# Patient Record
Sex: Female | Born: 1990 | Race: Black or African American | Hispanic: No | Marital: Single | Smoking: Former smoker
Health system: Southern US, Community
[De-identification: ages and names within clinical notes are randomized; demographics above are authoritative.]

## PROBLEM LIST (undated history)

## (undated) ENCOUNTER — Inpatient Hospital Stay (HOSPITAL_COMMUNITY): Payer: Self-pay

## (undated) DIAGNOSIS — O4290 Premature rupture of membranes, unspecified as to length of time between rupture and onset of labor, unspecified weeks of gestation: Secondary | ICD-10-CM

## (undated) DIAGNOSIS — B9689 Other specified bacterial agents as the cause of diseases classified elsewhere: Secondary | ICD-10-CM

## (undated) DIAGNOSIS — G43909 Migraine, unspecified, not intractable, without status migrainosus: Secondary | ICD-10-CM

## (undated) DIAGNOSIS — N760 Acute vaginitis: Secondary | ICD-10-CM

## (undated) DIAGNOSIS — N730 Acute parametritis and pelvic cellulitis: Secondary | ICD-10-CM

## (undated) HISTORY — DX: Migraine, unspecified, not intractable, without status migrainosus: G43.909

## (undated) HISTORY — PX: NO PAST SURGERIES: SHX2092

---
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
## 2011-01-10 LAB — POCT I-STAT, CHEM 8
Calcium, Ion: 1.13 mmol/L (ref 1.12–1.32)
Chloride: 102 mEq/L (ref 96–112)
HCT: 40 % (ref 36.0–49.0)
Hemoglobin: 13.6 g/dL (ref 12.0–16.0)
Potassium: 3.5 mEq/L (ref 3.5–5.1)

## 2011-01-10 LAB — POCT URINALYSIS DIP (DEVICE)
Bilirubin Urine: NEGATIVE
Glucose, UA: NEGATIVE mg/dL
Nitrite: POSITIVE — AB
Urobilinogen, UA: 1 mg/dL (ref 0.0–1.0)

## 2011-01-10 LAB — URINE CULTURE: Colony Count: >=100000

## 2011-01-10 LAB — CBC
MCHC: 34.7 g/dL (ref 31.0–37.0)
Platelets: 289 10*3/uL (ref 150–400)
RDW: 12.5 % (ref 11.4–15.5)

## 2011-01-10 LAB — DIFFERENTIAL
Basophils Absolute: 0 10*3/uL (ref 0.0–0.1)
Eosinophils Absolute: 0 10*3/uL (ref 0.0–1.2)
Lymphocytes Relative: 5 % — ABNORMAL LOW (ref 24–48)
Neutrophils Relative %: 85 % — ABNORMAL HIGH (ref 43–71)

## 2011-04-09 ENCOUNTER — Inpatient Hospital Stay (HOSPITAL_COMMUNITY)
Admission: EM | Admit: 2011-04-09 | Discharge: 2011-04-09 | Disposition: A | Discharge status: Home / Self Care | Payer: Medicaid Other | Source: Ambulatory Visit | Attending: Obstetrics & Gynecology | Admitting: Obstetrics & Gynecology

## 2011-04-09 ENCOUNTER — Encounter (HOSPITAL_COMMUNITY): Payer: Self-pay | Admitting: *Deleted

## 2011-04-09 DIAGNOSIS — N898 Other specified noninflammatory disorders of vagina: Secondary | ICD-10-CM

## 2011-04-09 DIAGNOSIS — N949 Unspecified condition associated with female genital organs and menstrual cycle: Secondary | ICD-10-CM | POA: Insufficient documentation

## 2011-04-09 LAB — POCT PREGNANCY, URINE: Preg Test, Ur: NEGATIVE

## 2011-04-09 LAB — WET PREP, GENITAL
Clue Cells Wet Prep HPF POC: NONE SEEN
Trich, Wet Prep: NONE SEEN
Yeast Wet Prep HPF POC: NONE SEEN

## 2011-04-09 LAB — URINALYSIS, ROUTINE W REFLEX MICROSCOPIC
Glucose, UA: NEGATIVE mg/dL
Hgb urine dipstick: NEGATIVE
Leukocytes, UA: NEGATIVE
Protein, ur: NEGATIVE mg/dL
pH: 7 (ref 5.0–8.0)

## 2011-04-09 NOTE — Initial Assessments (Signed)
abd (mid/bilateral), mid-back pain  started a wk ago. Intermittent, sharp sudden pains.  Had sex last wk, couple days later, noted a brownish d/c in  [place] - noted x1.  Denies pain or burning with urination.  Denies n/v, diarrhea or constipation.  Not a new partner, off and on since 6th grade.  2 partners in past year.

## 2011-04-09 NOTE — ED Provider Notes (Signed)
History   Pt presents today c/o vag dc for the past week. She is concerned that she could be pregnant. She denies vag bleeding, fever, or current pain. She states the pain comes and goes and is located in her lower abd.  Chief Complaint  Patient presents with  . Back Pain   HPI  OB History    Grav Para Term Preterm Abortions TAB SAB Ect Mult Living   0               Past Medical History  Diagnosis Date  . No pertinent past medical history     Past Surgical History  Procedure Date  . No past surgeries     No family history on file.  History  Substance Use Topics  . Smoking status: Current Some Day Smoker -- 3 years    Types: Cigarettes  . Smokeless tobacco: Never Used  . Alcohol Use: No    Allergies: No Known Allergies  Prescriptions prior to admission  Medication Sig Dispense Refill  . levonorgestrel-ethinyl estradiol (AVIANE,ALESSE,LESSINA) 0.1-20 MG-MCG per tablet Take 1 tablet by mouth daily.          Review of Systems  Constitutional: Negative for fever and chills.  Cardiovascular: Negative for chest pain.  Gastrointestinal: Positive for abdominal pain. Negative for nausea and vomiting.  Genitourinary: Negative for dysuria, urgency, frequency and hematuria.  Neurological: Negative for dizziness and headaches.  Psychiatric/Behavioral: Negative for depression and suicidal ideas.   Physical Exam   Blood pressure 111/74, pulse 61, temperature 98.1 F (36.7 C), temperature source Oral, resp. rate 16, height 5\' 3"  (1.6 m), weight 102 lb 12.8 oz (46.63 kg), last menstrual period 03/20/2011.  Physical Exam  Constitutional: She is oriented to person, place, and time. She appears well-developed and well-nourished. No distress.  GI: Soft. She exhibits no distension. There is no tenderness. There is no rebound and no guarding.  Genitourinary: No bleeding around the vagina. Vaginal discharge found.  Neurological: She is alert and oriented to person, place, and time.    Skin: Skin is warm and dry.  Psychiatric: She has a normal mood and affect. Her behavior is normal. Judgment and thought content normal.    MAU Course  Procedures   Wet prep shows WBCs TNTC. UA neg  A/P: 1) normal vag dc. Pt will f/u with her PCP. Discussed safe sex precautions and methods of birth control. She will f/u with her PCP to further discuss birth control options. She will take OTC motrin for pain relief. Discussed diet, activity, risks, and precautions.  Candice White, [place] 04/09/11 1528

## 2011-04-09 NOTE — Progress Notes (Signed)
Pt states she has had a vaginal d/c after having sex and her friend told her she might be pregnant. Pt states she has also had general abdominal and general back pain for about one week.

## 2011-10-11 ENCOUNTER — Emergency Department (HOSPITAL_COMMUNITY)
Admission: EM | Admit: 2011-10-11 | Discharge: 2011-10-11 | Disposition: A | Discharge status: Home / Self Care | Payer: Medicaid Other | Attending: Emergency Medicine | Admitting: Emergency Medicine

## 2011-10-11 ENCOUNTER — Encounter (HOSPITAL_COMMUNITY): Payer: Self-pay | Admitting: *Deleted

## 2011-10-11 DIAGNOSIS — N760 Acute vaginitis: Secondary | ICD-10-CM | POA: Insufficient documentation

## 2011-10-11 DIAGNOSIS — B9689 Other specified bacterial agents as the cause of diseases classified elsewhere: Secondary | ICD-10-CM | POA: Insufficient documentation

## 2011-10-11 DIAGNOSIS — R109 Unspecified abdominal pain: Secondary | ICD-10-CM | POA: Insufficient documentation

## 2011-10-11 DIAGNOSIS — A499 Bacterial infection, unspecified: Secondary | ICD-10-CM | POA: Insufficient documentation

## 2011-10-11 DIAGNOSIS — N644 Mastodynia: Secondary | ICD-10-CM | POA: Insufficient documentation

## 2011-10-11 DIAGNOSIS — N739 Female pelvic inflammatory disease, unspecified: Secondary | ICD-10-CM | POA: Insufficient documentation

## 2011-10-11 DIAGNOSIS — R300 Dysuria: Secondary | ICD-10-CM | POA: Insufficient documentation

## 2011-10-11 DIAGNOSIS — F17200 Nicotine dependence, unspecified, uncomplicated: Secondary | ICD-10-CM | POA: Insufficient documentation

## 2011-10-11 DIAGNOSIS — R10819 Abdominal tenderness, unspecified site: Secondary | ICD-10-CM | POA: Insufficient documentation

## 2011-10-11 DIAGNOSIS — N949 Unspecified condition associated with female genital organs and menstrual cycle: Secondary | ICD-10-CM | POA: Insufficient documentation

## 2011-10-11 LAB — WET PREP, GENITAL
Trich, Wet Prep: NONE SEEN
Yeast Wet Prep HPF POC: NONE SEEN

## 2011-10-11 LAB — URINALYSIS, ROUTINE W REFLEX MICROSCOPIC
Bilirubin Urine: NEGATIVE
Glucose, UA: NEGATIVE mg/dL
Hgb urine dipstick: NEGATIVE
Ketones, ur: NEGATIVE mg/dL
Protein, ur: 100 mg/dL — AB
pH: 6.5 (ref 5.0–8.0)

## 2011-10-11 LAB — URINE MICROSCOPIC-ADD ON

## 2011-10-11 MED ORDER — CEFTRIAXONE SODIUM 250 MG IJ SOLR
250.0000 mg | Freq: Once | INTRAMUSCULAR | Status: AC
  Administered 2011-10-11: 250 mg via INTRAMUSCULAR
  Filled 2011-10-11: qty 250

## 2011-10-11 MED ORDER — DOXYCYCLINE HYCLATE 100 MG PO TABS
100.0000 mg | ORAL_TABLET | Freq: Once | ORAL | Status: AC
  Administered 2011-10-11: 100 mg via ORAL
  Filled 2011-10-11: qty 1

## 2011-10-11 MED ORDER — LIDOCAINE HCL (PF) 1 % IJ SOLN
INTRAMUSCULAR | Status: AC
  Administered 2011-10-11: 21:00:00
  Filled 2011-10-11: qty 5

## 2011-10-11 MED ORDER — DOXYCYCLINE HYCLATE 100 MG PO TABS: 100.0000 mg | ORAL_TABLET | Freq: Two times a day (BID) | ORAL | Status: AC

## 2011-10-11 MED ORDER — METRONIDAZOLE 500 MG PO TABS: 500.0000 mg | ORAL_TABLET | Freq: Two times a day (BID) | ORAL | Status: AC

## 2011-10-11 NOTE — ED Notes (Signed)
Onset 3 days ago intermittent pain bilateral breast. Pain currently 0/10 when pain appears pain is sharp 10/10 lasting for 1 - 2 minutes.  Patient also states dysuria burning 10/10 and at pelvic pain pressure 5/10 radiating to bilateral flanks. Airway intact bilateral equal chest rise and fall.

## 2011-10-11 NOTE — ED Notes (Signed)
Discharge inst given voiced understanding. 

## 2011-10-11 NOTE — ED Notes (Signed)
The pt is c/o lower back and lower abd pain for 3 days. With urinary frequency.  lmp dec 2-12

## 2011-10-11 NOTE — ED Provider Notes (Signed)
History     CSN: [id number]  Arrival date & time 10/11/11  1610   First MD Initiated Contact with Patient 10/11/11 1850      Chief Complaint  Patient presents with  . Abdominal Pain    (Consider location/radiation/quality/duration/timing/severity/associated sxs/prior treatment) HPI  Past Medical History  Diagnosis Date  . No pertinent past medical history     Past Surgical History  Procedure Date  . No past surgeries     History reviewed. No pertinent family history.  History  Substance Use Topics  . Smoking status: Current Some Day Smoker -- 3 years    Types: Cigarettes  . Smokeless tobacco: Never Used  . Alcohol Use: No    OB History    Grav Para Term Preterm Abortions TAB SAB Ect Mult Living   0               Review of Systems  Allergies  Review of patient's allergies indicates no known allergies.  Home Medications   Current Outpatient Rx  Name Route Sig Dispense Refill  . DOXYCYCLINE HYCLATE 100 MG PO TABS Oral Take 1 tablet (100 mg total) by mouth 2 (two) times daily. 28 tablet 0    Take twice a day for 14 days.  Marland Kitchen METRONIDAZOLE 500 MG PO TABS Oral Take 1 tablet (500 mg total) by mouth 2 (two) times daily. 14 tablet 0    BP 100/61  Pulse 77  Temp(Src) 98.3 F (36.8 C) (Oral)  Resp 16  Ht 5\' 3"  (1.6 m)  SpO2 100%  LMP 09/10/2011  Physical Exam  ED Course  Procedures (including critical care time)  Labs Reviewed  URINALYSIS, ROUTINE W REFLEX MICROSCOPIC - Abnormal; Notable for the following:    APPearance HAZY (*)    Protein, ur 100 (*)    All other components within normal limits  URINE MICROSCOPIC-ADD ON - Abnormal; Notable for the following:    Squamous Epithelial / LPF MANY (*)    All other components within normal limits  WET PREP, GENITAL - Abnormal; Notable for the following:    Clue Cells, Wet Prep FEW (*)    All other components within normal limits  POCT PREGNANCY, URINE  POCT PREGNANCY, URINE  GC/CHLAMYDIA PROBE AMP,  GENITAL   No results found.   1. Pelvic inflammatory disease   2. Bacterial vaginosis       MDM  Medical screening examination/treatment/procedure(s) were performed by non-physician practitioner and as supervising physician I was immediately available for consultation/collaboration.        Loren Racer, MD 10/11/11 2208

## 2011-10-11 NOTE — ED Provider Notes (Signed)
History     CSN: [id number]  Arrival date & time 10/11/11  1610   First MD Initiated Contact with Patient 10/11/11 1850      Chief Complaint  Patient presents with  . Abdominal Pain    (Consider location/radiation/quality/duration/timing/severity/associated sxs/prior treatment) Patient is a 21 y.o. female presenting with abdominal pain. The history is provided by the patient. No language interpreter was used.  Abdominal Pain The primary symptoms of the illness include abdominal pain, dysuria and vaginal discharge (mild, white). The primary symptoms of the illness do not include fever, fatigue, shortness of breath, nausea, vomiting, diarrhea or vaginal bleeding. The current episode started more than 2 days ago. The onset of the illness was sudden. The problem has not changed since onset. The abdominal pain began more than 2 days ago. The pain came on gradually. The abdominal pain has been unchanged since its onset. The abdominal pain is located in the suprapubic region. The abdominal pain radiates to the back. The abdominal pain is relieved by nothing.  The dysuria began 3 to 5 days ago. The discomfort is mild. She is currently sexually active. The dysuria is associated with discharge (mild vaginal discharge). The dysuria is not associated with hematuria, frequency, urgency or vaginal pain.  The vaginal discharge is associated with dysuria.   Symptoms associated with the illness do not include urgency, hematuria or frequency.    Past Medical History  Diagnosis Date  . No pertinent past medical history     Past Surgical History  Procedure Date  . No past surgeries     History reviewed. No pertinent family history.  History  Substance Use Topics  . Smoking status: Current Some Day Smoker -- 3 years    Types: Cigarettes  . Smokeless tobacco: Never Used  . Alcohol Use: No    OB History    Grav Para Term Preterm Abortions TAB SAB Ect Mult Living   0               Review  of Systems  Constitutional: Negative for fever and fatigue.  Respiratory: Negative for shortness of breath.   Cardiovascular:       Sharp, intermittent, brief breast pains  Gastrointestinal: Positive for abdominal pain. Negative for nausea, vomiting and diarrhea.  Genitourinary: Positive for dysuria and vaginal discharge (mild, white). Negative for urgency, frequency, hematuria, vaginal bleeding and vaginal pain.  All other systems reviewed and are negative.    Allergies  Review of patient's allergies indicates no known allergies.  Home Medications  No current outpatient prescriptions on file.  BP 100/61  Pulse 77  Temp(Src) 98.3 F (36.8 C) (Oral)  Resp 16  Ht 5\' 3"  (1.6 m)  SpO2 100%  LMP 09/10/2011  Physical Exam  Nursing note and vitals reviewed. Constitutional: She is oriented to person, place, and time. She appears well-developed and well-nourished. No distress.  HENT:  Head: Normocephalic and atraumatic.  Eyes: EOM are normal. Pupils are equal, round, and reactive to light.  Neck: Normal range of motion. Neck supple.  Cardiovascular: Normal rate and regular rhythm.  Exam reveals no friction rub.   No murmur heard. Pulmonary/Chest: Effort normal and breath sounds normal. No respiratory distress. She has no wheezes. She has no rales.  Abdominal: Soft. She exhibits no distension. There is tenderness (Mild suprapubic). There is no rebound.  Genitourinary: There is no rash, tenderness, lesion or injury on the right labia. There is no rash, tenderness, lesion or injury on the left labia.  Cervix exhibits no motion tenderness, no discharge and no friability. Right adnexum displays tenderness (mild). Left adnexum displays tenderness (mild). There is tenderness (Mild in bilateral adnexa') around the vagina. Vaginal discharge (Mild, white) found.  Musculoskeletal: Normal range of motion. She exhibits no edema.  Neurological: She is alert and oriented to person, place, and time.    Skin: She is not diaphoretic.    ED Course  Procedures (including critical care time)  Labs Reviewed  URINALYSIS, ROUTINE W REFLEX MICROSCOPIC - Abnormal; Notable for the following:    APPearance HAZY (*)    Protein, ur 100 (*)    All other components within normal limits  URINE MICROSCOPIC-ADD ON - Abnormal; Notable for the following:    Squamous Epithelial / LPF MANY (*)    All other components within normal limits  WET PREP, GENITAL - Abnormal; Notable for the following:    Clue Cells, Wet Prep FEW (*)    All other components within normal limits  POCT PREGNANCY, URINE  POCT PREGNANCY, URINE  GC/CHLAMYDIA PROBE AMP, GENITAL   No results found.   1. Pelvic inflammatory disease   2. Bacterial vaginosis       MDM  Patient is a 21 year old female who presents with lower abdominal pain radiating to her lower back. Also reports mild dysuria. Also has mild vaginal discharge. Denies fevers, nausea, vomiting. Recent unprotected sex with a new partner is concerned about possible exposure to STDs. Patient is afebrile vital signs are stable. Patient is mild suprapubic tenderness otherwise abdomen is benign. Patient has no genitourinary lesions. Cervix is normal without friability or redness. Patient has mild tenderness in bilateral adnexa without mass or fullness. No cervical motion tenderness. Urine studies are negative for UTI. Concern for pelvic inflammatory disease with bilateral adnexal tenderness. Will treat with intramuscular Rocephin and PO doxycycline. Patient has clue cells on wet prep. We'll give Flagyl for bacterial vaginosis. Discharge home in stable condition.        Elwin Mocha, MD 10/11/11 2051

## 2011-10-12 NOTE — ED Provider Notes (Signed)
I saw and evaluated the patient, reviewed the resident's note and I agree with the findings and plan.  Loren Racer, MD 10/12/11 [phone]

## 2012-01-15 ENCOUNTER — Emergency Department (INDEPENDENT_AMBULATORY_CARE_PROVIDER_SITE_OTHER)
Admission: EM | Admit: 2012-01-15 | Discharge: 2012-01-15 | Disposition: A | Discharge status: Home / Self Care | Payer: Medicaid Other | Source: Home / Self Care | Attending: Emergency Medicine | Admitting: Emergency Medicine

## 2012-01-15 ENCOUNTER — Encounter (HOSPITAL_COMMUNITY): Payer: Self-pay | Admitting: Emergency Medicine

## 2012-01-15 DIAGNOSIS — Z3202 Encounter for pregnancy test, result negative: Secondary | ICD-10-CM

## 2012-01-15 DIAGNOSIS — Z0000 Encounter for general adult medical examination without abnormal findings: Secondary | ICD-10-CM

## 2012-01-15 HISTORY — DX: Acute vaginitis: N76.0

## 2012-01-15 HISTORY — DX: Acute parametritis and pelvic cellulitis: N73.0

## 2012-01-15 HISTORY — DX: Other specified bacterial agents as the cause of diseases classified elsewhere: B96.89

## 2012-01-15 LAB — POCT URINALYSIS DIP (DEVICE)
Glucose, UA: NEGATIVE mg/dL
Ketones, ur: NEGATIVE mg/dL
Leukocytes, UA: NEGATIVE
Nitrite: NEGATIVE
pH: 6 (ref 5.0–8.0)

## 2012-01-15 LAB — POCT PREGNANCY, URINE: Preg Test, Ur: NEGATIVE

## 2012-01-15 NOTE — ED Provider Notes (Addendum)
History     CSN: [id number]  Arrival date & time 01/15/12  1331   First MD Initiated Contact with Patient 01/15/12 1449      Chief Complaint  Patient presents with  . Possible Pregnancy    (Consider location/radiation/quality/duration/timing/severity/associated sxs/prior treatment) HPI Comments: Patient is here for pregnancy test. States she's not had her period since "sometime in February". States that her nipples are tender. Patient has no urinary urgency, frequency, dysuria, vaginal bleeding, vaginal discharge. No genital complaints. No abdominal pain. Patient states she is sexually active with a female partner, and does not consistently use condoms. Patient is not check a home pregnancy test.  ROS as noted in HPI. All other ROS negative.   The history is provided by the patient. No language interpreter was used.    Past Medical History  Diagnosis Date  . PID (acute pelvic inflammatory disease)   . BV (bacterial vaginosis)     Past Surgical History  Procedure Date  . No past surgeries     History reviewed. No pertinent family history.  History  Substance Use Topics  . Smoking status: Current Some Day Smoker -- 3 years    Types: Cigarettes  . Smokeless tobacco: Never Used  . Alcohol Use: No    OB History    Grav Para Term Preterm Abortions TAB SAB Ect Mult Living   0               Review of Systems  Allergies  Review of patient's allergies indicates no known allergies.  Home Medications  No current outpatient prescriptions on file.  BP 80/62  Pulse 66  Temp(Src) 97.2 F (36.2 C) (Oral)  Resp 14  SpO2 100%  LMP 11/20/2011  Physical Exam  Nursing note and vitals reviewed. Constitutional: She is oriented to person, place, and time. She appears well-developed and well-nourished. No distress.  HENT:  Head: Normocephalic and atraumatic.  Eyes: Conjunctivae and EOM are normal.  Neck: Normal range of motion.  Cardiovascular: Normal rate, regular  rhythm, normal heart sounds and intact distal pulses.   Pulmonary/Chest: Effort normal.  Abdominal: She exhibits no distension.  Musculoskeletal: Normal range of motion.  Neurological: She is alert and oriented to person, place, and time.  Skin: Skin is warm and dry.  Psychiatric: She has a normal mood and affect. Her behavior is normal. Judgment and thought content normal.    ED Course  Procedures (including critical care time)  Labs Reviewed - No data to display No results found.   1. Normal physical exam    Urine pregnancy negative. Results not crossing over Results for orders placed during the hospital encounter of 01/15/12  POCT PREGNANCY, URINE      Component Value Range   Preg Test, Ur NEGATIVE  NEGATIVE   POCT URINALYSIS DIP (DEVICE)      Component Value Range   Glucose, UA NEGATIVE  NEGATIVE (mg/dL)   Bilirubin Urine SMALL (*) NEGATIVE    Ketones, ur NEGATIVE  NEGATIVE (mg/dL)   Specific Gravity, Urine 1.025  1.005 - 1.030    Hgb urine dipstick NEGATIVE  NEGATIVE    pH 6.0  5.0 - 8.0    Protein, ur 100 (*) NEGATIVE (mg/dL)   Urobilinogen, UA 0.2  0.0 - 1.0 (mg/dL)   Nitrite NEGATIVE  NEGATIVE    Leukocytes, UA NEGATIVE  NEGATIVE      MDM  Previous labs, reviewed. Additional medical history obtained. Tested negative for gonorrhea and chlamydia 90 of time  since January 2012, RPR negative in January 2012. No history of yeast or Trichomonas.  BP on prev visits 111/74, 100/61.  Repeat blood pressure on my exam 90/60. Patient is otherwise asymptomatic. Denies chest pain, dizziness, presyncope, syncope. No abdominal pain. Emphasized importance of routine health screenings, using birth control and condoms consistently. Referring her to a primary care resources.  Luiz Blare, MD 01/15/12 4098  Luiz Blare, MD 01/15/12 2216

## 2012-01-15 NOTE — ED Notes (Signed)
Unsure of when she last had a menstrual period; sexually active w sporadic use of condoms; c/o her "junk" is  swelling and tender, and her breasts are tender

## 2012-01-15 NOTE — ED Notes (Signed)
Lengthy discussion w pt regarding pregnancy planning

## 2012-01-15 NOTE — Discharge Instructions (Signed)
Go to www.goodrx.com to look up your medications. This will give you a list of where you can find your prescriptions at the most affordable prices.   RESOURCE GUIDE  No Primary Care Doctor Call [facility]  [phone] Other agencies that provide inexpensive medical care    [facility]  [phone]     Internal Medicine  [phone]    [facility]  [phone]    [facility]  [phone] [place] [address]    [facility]  [phone]    [facility]  [phone] [facility] [phone]   [address] [place], [place] [address]  [place] [facility]  [facility] [facility]     [employer]                          [facility] [address] [place]                       [address]      [address]   [phone] (After Hours)   

## 2012-02-19 ENCOUNTER — Ambulatory Visit: Payer: Medicaid Other | Admitting: Family Medicine

## 2012-02-20 ENCOUNTER — Emergency Department (INDEPENDENT_AMBULATORY_CARE_PROVIDER_SITE_OTHER)
Admission: EM | Admit: 2012-02-20 | Discharge: 2012-02-20 | Disposition: A | Discharge status: Home / Self Care | Payer: Medicaid Other | Source: Home / Self Care | Attending: Emergency Medicine | Admitting: Emergency Medicine

## 2012-02-20 ENCOUNTER — Encounter (HOSPITAL_COMMUNITY): Payer: Self-pay | Admitting: Emergency Medicine

## 2012-02-20 DIAGNOSIS — N898 Other specified noninflammatory disorders of vagina: Secondary | ICD-10-CM

## 2012-02-20 LAB — POCT URINALYSIS DIP (DEVICE)
Glucose, UA: NEGATIVE mg/dL
Ketones, ur: NEGATIVE mg/dL
Specific Gravity, Urine: 1.025 (ref 1.005–1.030)

## 2012-02-20 LAB — POCT PREGNANCY, URINE: Preg Test, Ur: NEGATIVE

## 2012-02-20 LAB — WET PREP, GENITAL
Trich, Wet Prep: NONE SEEN
Yeast Wet Prep HPF POC: NONE SEEN

## 2012-02-20 MED ORDER — FLUCONAZOLE 150 MG PO TABS: ORAL_TABLET | ORAL | Status: DC

## 2012-02-20 NOTE — ED Notes (Signed)
PT HERE WITH C/O YELLOW,THICK VAG D/C WITH ODOR AND YELLOW/BROWN URINE THAT STARTED 5/1.[facility] AND TOLD NEG FOR STD.NO MEDS GIVEN.DENIES ABD OR BACK PAIN,FEVERS.LMP 12/30/11

## 2012-02-20 NOTE — Discharge Instructions (Signed)
We will contact ONLY if further treatment is necessary.

## 2012-02-20 NOTE — ED Provider Notes (Signed)
History     CSN: [id number]  Arrival date & time 02/20/12  1341   First MD Initiated Contact with Patient 02/20/12 1342      Chief Complaint  Patient presents with  . Vaginal Discharge    (Consider location/radiation/quality/duration/timing/severity/associated sxs/prior treatment) HPI Comments: I have been having a vaginal discharge with some itchiness from the beginning of May, at that point I went to the [facility] and they examined me but he told me all my test results were normal. I have not been treated or given any prescriptions at discharge continues and its worse or now. With more itching. Patient denies any pelvic pain, fevers, or urinary symptoms.  Patient is a 21 y.o. female presenting with vaginal discharge. The history is provided by the patient.  Vaginal Discharge This is a new problem. The current episode started more than 1 week ago. The problem occurs constantly. The problem has not changed since onset.Pertinent negatives include no abdominal pain and no shortness of breath. The symptoms are aggravated by nothing. She has tried nothing for the symptoms.    Past Medical History  Diagnosis Date  . PID (acute pelvic inflammatory disease)   . BV (bacterial vaginosis)     Past Surgical History  Procedure Date  . No past surgeries     History reviewed. No pertinent family history.  History  Substance Use Topics  . Smoking status: Current Some Day Smoker -- 3 years    Types: Cigarettes  . Smokeless tobacco: Never Used  . Alcohol Use: Yes    OB History    Grav Para Term Preterm Abortions TAB SAB Ect Mult Living   0               Review of Systems  Constitutional: Negative for fever, diaphoresis, activity change, appetite change and fatigue.  Respiratory: Negative for shortness of breath.   Gastrointestinal: Negative for abdominal pain.  Genitourinary: Positive for vaginal discharge. Negative for dysuria, frequency, flank pain, vaginal  bleeding, difficulty urinating, genital sores and vaginal pain.    Allergies  Review of patient's allergies indicates no known allergies.  Home Medications   Current Outpatient Rx  Name Route Sig Dispense Refill  . FLUCONAZOLE 150 MG PO TABS  1 TAB PO X 1 REPEAT IN 1 WEEK 2 tablet 0    LMP 01/02/2012  Physical Exam  Constitutional: She appears well-developed and well-nourished.  HENT:  Head: Normocephalic.  Neck: Neck supple.  Pulmonary/Chest: Effort normal.  Abdominal: There is no guarding.  Genitourinary: No labial fusion. There is no rash, tenderness or lesion on the right labia. There is no rash, tenderness or lesion on the left labia. No bleeding around the vagina. No foreign body around the vagina. Vaginal discharge found.  Lymphadenopathy:       Right: No inguinal adenopathy present.       Left: No inguinal adenopathy present.  Skin: No rash noted.    ED Course  Procedures (including critical care time)  Labs Reviewed  POCT URINALYSIS DIP (DEVICE) - Abnormal; Notable for the following:    Bilirubin Urine SMALL (*)    Protein, ur 100 (*)    Leukocytes, UA SMALL (*) Biochemical Testing Only. Please order routine urinalysis from main lab if confirmatory testing is needed.   All other components within normal limits  POCT PREGNANCY, URINE  URINALYSIS, DIPSTICK ONLY  WET PREP, GENITAL  GC/CHLAMYDIA PROBE AMP, GENITAL   No results found.   1. Vaginal Discharge  MDM  Recurrent vaginitis. Today patient was STD screen and wet prep sample obtained clinically discussed with her empirical treatment for yeast infection patient agreed and was informed of OB call if abnormal test will need further treatment patient agree with treatment plan of        Jimmie Molly, MD 02/20/12 1426

## 2012-02-22 LAB — GC/CHLAMYDIA PROBE AMP, GENITAL: Chlamydia, DNA Probe: NEGATIVE

## 2012-03-11 ENCOUNTER — Encounter: Payer: Self-pay | Admitting: Family Medicine

## 2012-03-11 ENCOUNTER — Ambulatory Visit (INDEPENDENT_AMBULATORY_CARE_PROVIDER_SITE_OTHER): Payer: Self-pay | Admitting: Family Medicine

## 2012-03-11 VITALS — BP 95/63 | HR 71 | Temp 98.3°F | Ht 63.75 in | Wt 100.0 lb

## 2012-03-11 DIAGNOSIS — Z3009 Encounter for other general counseling and advice on contraception: Secondary | ICD-10-CM

## 2012-03-11 LAB — POCT URINE PREGNANCY: Preg Test, Ur: NEGATIVE

## 2012-03-11 MED ORDER — MEDROXYPROGESTERONE ACETATE 150 MG/ML IM SUSP
150.0000 mg | Freq: Once | INTRAMUSCULAR | Status: AC
  Administered 2012-03-11: 150 mg via INTRAMUSCULAR

## 2012-03-11 MED ORDER — MEDROXYPROGESTERONE ACETATE 150 MG/ML IM SUSP: 150.0000 mg | INTRAMUSCULAR | Status: DC

## 2012-03-11 NOTE — Assessment & Plan Note (Signed)
Will start depo and f/u q 3 months

## 2012-03-11 NOTE — Progress Notes (Signed)
  Subjective:    Patient ID: Candice White, female    DOB: [birth year], 21 y.o.   MRN: [id number]  HPI Pt here as a new patient. She does not have any medical issues she is aware of. She would like to start birth control. She has tried the pills in the past but was not good at taking them. She has many friends who are on the Depo shot. She would like to do a depo shot.  Patient is sexually active with one partner. She uses condoms every time. She denies any discharge that is bothersome to her.   Review of Systems Denies CP, SOB, HA, N/V/D, fever     Objective:   Physical Exam Vital signs reviewed General appearance - alert, well appearing, and in no distress Heart - normal rate, regular rhythm, normal S1, S2, no murmurs, rubs, clicks or gallops Chest - clear to auscultation, no wheezes, rales or rhonchi, symmetric air entry, no tachypnea, retractions or cyanosis Abdomen - soft, nontender, nondistended, no masses or organomegaly Extremities - peripheral pulses normal, no pedal edema, no clubbing or cyanosis        Assessment & Plan:

## 2012-05-20 ENCOUNTER — Telehealth: Payer: Self-pay | Admitting: Family Medicine

## 2012-05-20 NOTE — Telephone Encounter (Signed)
Is spotting off and on and is on Depo - wants to know what to do

## 2012-05-20 NOTE — Telephone Encounter (Signed)
Patient started Depo in June . Started some bleeding end of July and has continued to have bleeding pff and on. Not heavy but sometimes bright red and sometimes dark.  Explained that this is not uncommon when starting Depo . She is due for next injection 09/03 she states.  We will discuss at that time. Advised if bleeding becomes heavy to call back sooner.

## 2012-06-02 ENCOUNTER — Ambulatory Visit: Payer: Medicaid Other

## 2012-06-04 ENCOUNTER — Ambulatory Visit: Payer: Medicaid Other

## 2012-07-16 ENCOUNTER — Ambulatory Visit (INDEPENDENT_AMBULATORY_CARE_PROVIDER_SITE_OTHER): Payer: Medicaid Other | Admitting: *Deleted

## 2012-07-16 DIAGNOSIS — Z309 Encounter for contraceptive management, unspecified: Secondary | ICD-10-CM

## 2012-07-16 LAB — POCT URINE PREGNANCY: Preg Test, Ur: NEGATIVE

## 2012-07-16 MED ORDER — MEDROXYPROGESTERONE ACETATE 150 MG/ML IM SUSP
150.0000 mg | Freq: Once | INTRAMUSCULAR | Status: AC
  Administered 2012-07-16: 150 mg via INTRAMUSCULAR

## 2012-07-16 NOTE — Progress Notes (Signed)
Patient in for Depo today . She was due Sept  11. Her medicaid had run out and she had to wait to get it reissued.. Patient   states last sex was more than 2 weeks ago. Urine specimen obtained for pregnancy test and is negative.  Depo given .    Patient states since starting depo she  has had bleeding off and on. Sometimes it is dark and sometimes red and flow is like a regular period. Now for past 3 weeks has had orange very thin discharge   that is heavy at times. Sometimes can use 3 pads per day.  Patient has  already  scheduled appointment 10/22 Consulted with Dr. [place] and she advises  OK to follow up at scheduled appointment.

## 2012-07-28 ENCOUNTER — Encounter: Payer: Medicaid Other | Admitting: Family Medicine

## 2012-08-05 ENCOUNTER — Encounter: Payer: Medicaid Other | Admitting: Family Medicine

## 2012-10-01 ENCOUNTER — Ambulatory Visit: Payer: Medicaid Other

## 2012-10-05 ENCOUNTER — Encounter: Payer: Medicaid Other | Admitting: Family Medicine

## 2012-10-16 ENCOUNTER — Encounter: Payer: Self-pay | Admitting: Family Medicine

## 2012-10-16 ENCOUNTER — Ambulatory Visit (INDEPENDENT_AMBULATORY_CARE_PROVIDER_SITE_OTHER): Payer: Medicaid Other | Admitting: Family Medicine

## 2012-10-16 VITALS — BP 106/72 | HR 80 | Temp 99.5°F | Ht 63.0 in | Wt 112.0 lb

## 2012-10-16 DIAGNOSIS — G479 Sleep disorder, unspecified: Secondary | ICD-10-CM

## 2012-10-16 DIAGNOSIS — Z309 Encounter for contraceptive management, unspecified: Secondary | ICD-10-CM

## 2012-10-16 DIAGNOSIS — N898 Other specified noninflammatory disorders of vagina: Secondary | ICD-10-CM

## 2012-10-16 DIAGNOSIS — N760 Acute vaginitis: Secondary | ICD-10-CM

## 2012-10-16 DIAGNOSIS — A499 Bacterial infection, unspecified: Secondary | ICD-10-CM

## 2012-10-16 LAB — POCT WET PREP (WET MOUNT): Clue Cells Wet Prep Whiff POC: POSITIVE

## 2012-10-16 MED ORDER — METRONIDAZOLE 500 MG PO TABS: 500.0000 mg | ORAL_TABLET | Freq: Two times a day (BID) | ORAL | Status: DC

## 2012-10-16 MED ORDER — MEDROXYPROGESTERONE ACETATE 150 MG/ML IM SUSP
150.0000 mg | Freq: Once | INTRAMUSCULAR | Status: AC
  Administered 2012-10-16: 150 mg via INTRAMUSCULAR

## 2012-10-16 NOTE — Patient Instructions (Addendum)
Thanks for coming in today Please try cutting out the evening nap Try getting into a solid night time routine. Try to eat your last meal about 2 hours before bed. Please come back to see me specifically for this in 4 weeks if it is not getting better Have a great weekend

## 2012-10-16 NOTE — Assessment & Plan Note (Addendum)
Clue cells on wet prep and discharge consistent w/ BV [place] 500BID x 7 days Called pt to inform of results
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
## 2012-10-19 ENCOUNTER — Encounter: Payer: Self-pay | Admitting: Family Medicine

## 2012-10-19 NOTE — Assessment & Plan Note (Signed)
Depo working well for pt Continue w/ Depo today Not interested in other long term methods at this time

## 2012-10-19 NOTE — Assessment & Plan Note (Addendum)
Likely stress related vs depression vs faulty reporting vs porr sleep hygeine.  Pt likely underreporting sleep as 1-3 hrs nightly for nearly 8 weeks would be difficult to sustain especially while holding down a regular job and social life.  No evidence of fmhx of mania/bipolar Pt also w/ poor sleep hygeine including 2 hr nap late in the evening.  Pt to DC evening nap. Keep journal of nightly routine and actual sleep amounts Return appt for further depression/sleep eval. May need counseling for death of BF PHQ 9: 16

## 2012-12-15 ENCOUNTER — Ambulatory Visit: Payer: Medicaid Other | Admitting: Family Medicine

## 2013-01-01 ENCOUNTER — Ambulatory Visit: Payer: Medicaid Other

## 2013-01-01 ENCOUNTER — Ambulatory Visit: Payer: Medicaid Other | Admitting: Family Medicine

## 2013-01-04 ENCOUNTER — Ambulatory Visit: Payer: Medicaid Other | Admitting: Family Medicine

## 2013-01-05 ENCOUNTER — Ambulatory Visit (INDEPENDENT_AMBULATORY_CARE_PROVIDER_SITE_OTHER): Payer: Medicaid Other | Admitting: Family Medicine

## 2013-01-05 ENCOUNTER — Ambulatory Visit (HOSPITAL_COMMUNITY)
Admission: RE | Admit: 2013-01-05 | Discharge: 2013-01-05 | Disposition: A | Discharge status: Home / Self Care | Payer: Medicaid Other | Source: Ambulatory Visit | Attending: Family Medicine | Admitting: Family Medicine

## 2013-01-05 VITALS — BP 110/74 | HR 75 | Wt 113.0 lb

## 2013-01-05 DIAGNOSIS — Z3009 Encounter for other general counseling and advice on contraception: Secondary | ICD-10-CM

## 2013-01-05 DIAGNOSIS — R55 Syncope and collapse: Secondary | ICD-10-CM | POA: Insufficient documentation

## 2013-01-05 DIAGNOSIS — R9431 Abnormal electrocardiogram [ECG] [EKG]: Secondary | ICD-10-CM | POA: Insufficient documentation

## 2013-01-05 LAB — BASIC METABOLIC PANEL
CO2: 24 mEq/L (ref 19–32)
Chloride: 107 mEq/L (ref 96–112)
Creat: 0.78 mg/dL (ref 0.50–1.10)

## 2013-01-05 LAB — CBC
MCV: 87.6 fL (ref 78.0–100.0)
Platelets: 342 10*3/uL (ref 150–400)
RBC: 4.52 MIL/uL (ref 3.87–5.11)
WBC: 8.3 10*3/uL (ref 4.0–10.5)

## 2013-01-05 MED ORDER — MEDROXYPROGESTERONE ACETATE 150 MG/ML IM SUSP
150.0000 mg | Freq: Once | INTRAMUSCULAR | Status: AC
  Administered 2013-01-05: 150 mg via INTRAMUSCULAR

## 2013-01-05 NOTE — Progress Notes (Signed)
  Subjective:    Patient ID: Candice White, female    DOB: 08/25/1991, 22 y.o.   MRN: [id number]  HPI # She has been dizzy and lightheaded that leads to occasional fainting for the past few months.  Her last episode was March 11.  When this happens she feels hot and sweaty and she gets lightheaded and passes out for a few minutes. She remembers everything that happens when she awakes and is oriented.   She endorses headache and intermittent chest pain linked to these episodes.  She denies vision changes, nausea, vomiting, fatigue or weakness following, chest palpitations.   Review of Systems Per HPI She denies seizure like activity (tonic-clonic) or post-ictal symptoms.   Allergies, medication, past medical history reviewed.  Smoking status noted.  No history of cardiac problems or significant other medical problems  She quit smoking March 11 due to concern it may be related to these episodes      Objective:   Physical Exam GEN: NAD; well-nourished, -appearing; thin  PSYCH: pleasant NEURO: moves all extremities well; 5/5 strength all extremities; normal gait CV: RRR, no m/r/g PULM: NI WOB; CTAB without w/r/r  ECG: Rate: regular Rhythm: regular Axis: normal right axis Intervals and complexes: PR (normal 0.12-0.20): normal P-wave (normal <0.12 duration, 0.25 mv amplitude): normal QRS complex (normal 0.06-0.10): normal  QT prolongation (normal men ?0.44 sec; women ?0.45 to 0.46)? No Q waves? None ST or T-wave abnormalities? None Hypertrophy? LVH Extra or skipped beats? None Overall interpretation: normal sinus rhythm      Assessment & Plan:

## 2013-01-05 NOTE — Patient Instructions (Addendum)
Try these techniques when you feel like you are going to faint: Leg-crossing with simultaneous tensing of leg, abdominal, and buttock muscles.  Handgrip, which consists of maximum grip on a rubber ball or similar object.  Arm tensing, which involves gripping one hand with the other while simultaneously abducting both arms.   Check labs today. Follow-up in 1 week with Dr. Konrad Dolores to go over labs and see how you are doing.

## 2013-01-06 ENCOUNTER — Telehealth: Payer: Self-pay | Admitting: Family Medicine

## 2013-01-06 ENCOUNTER — Encounter: Payer: Self-pay | Admitting: Family Medicine

## 2013-01-06 NOTE — Assessment & Plan Note (Addendum)
It sounds like she has episodes of potential vasovagal syncope. They are weeks to months apart. The last episode March 11. Inciting factors unclear.  -ECG normal except she has some right axis deviation. Is this artifact, due to her age, normal variation? I do not think she warrants further work-up of this at this time as she does not seem to have family or personal history of cardiac disease.  -Check Hgb, electrolytes>>>WNL -Commended her on tobacco cessation.   Follow-up as needed if symptoms worsen. In event of vasovagal syncope, maneuvers shown (see AVS) to try to ameliorate excessive vagal response.

## 2013-01-06 NOTE — Telephone Encounter (Signed)
LVM. Patient called and notified normal results.

## 2013-01-30 ENCOUNTER — Telehealth (HOSPITAL_COMMUNITY): Payer: Self-pay | Admitting: Emergency Medicine

## 2013-01-30 ENCOUNTER — Emergency Department (HOSPITAL_COMMUNITY)
Admission: EM | Admit: 2013-01-30 | Discharge: 2013-01-30 | Disposition: A | Discharge status: Home / Self Care | Payer: Medicaid Other | Attending: Emergency Medicine | Admitting: Emergency Medicine

## 2013-01-30 ENCOUNTER — Emergency Department (HOSPITAL_COMMUNITY): Payer: Medicaid Other

## 2013-01-30 ENCOUNTER — Encounter (HOSPITAL_COMMUNITY): Payer: Self-pay | Admitting: Emergency Medicine

## 2013-01-30 DIAGNOSIS — R509 Fever, unspecified: Secondary | ICD-10-CM | POA: Insufficient documentation

## 2013-01-30 DIAGNOSIS — Z87891 Personal history of nicotine dependence: Secondary | ICD-10-CM | POA: Insufficient documentation

## 2013-01-30 DIAGNOSIS — Z8742 Personal history of other diseases of the female genital tract: Secondary | ICD-10-CM | POA: Insufficient documentation

## 2013-01-30 DIAGNOSIS — J020 Streptococcal pharyngitis: Secondary | ICD-10-CM | POA: Insufficient documentation

## 2013-01-30 DIAGNOSIS — R1310 Dysphagia, unspecified: Secondary | ICD-10-CM | POA: Insufficient documentation

## 2013-01-30 DIAGNOSIS — R220 Localized swelling, mass and lump, head: Secondary | ICD-10-CM | POA: Insufficient documentation

## 2013-01-30 DIAGNOSIS — R498 Other voice and resonance disorders: Secondary | ICD-10-CM | POA: Insufficient documentation

## 2013-01-30 DIAGNOSIS — R259 Unspecified abnormal involuntary movements: Secondary | ICD-10-CM | POA: Insufficient documentation

## 2013-01-30 DIAGNOSIS — Z8679 Personal history of other diseases of the circulatory system: Secondary | ICD-10-CM | POA: Insufficient documentation

## 2013-01-30 MED ORDER — MORPHINE SULFATE 4 MG/ML IJ SOLN
4.0000 mg | Freq: Once | INTRAMUSCULAR | Status: AC
  Administered 2013-01-30: 4 mg via INTRAVENOUS
  Filled 2013-01-30: qty 1

## 2013-01-30 MED ORDER — HYDROCODONE-ACETAMINOPHEN 7.5-500 MG/15ML PO SOLN: 15.0000 mL | Freq: Four times a day (QID) | ORAL | Status: DC | PRN

## 2013-01-30 MED ORDER — DEXAMETHASONE SODIUM PHOSPHATE 10 MG/ML IJ SOLN
10.0000 mg | Freq: Once | INTRAMUSCULAR | Status: AC
  Administered 2013-01-30: 10 mg via INTRAVENOUS
  Filled 2013-01-30: qty 1

## 2013-01-30 MED ORDER — ONDANSETRON HCL 4 MG/2ML IJ SOLN
4.0000 mg | Freq: Once | INTRAMUSCULAR | Status: AC
  Administered 2013-01-30: 4 mg via INTRAVENOUS
  Filled 2013-01-30: qty 2

## 2013-01-30 MED ORDER — SODIUM CHLORIDE 0.9 % IV BOLUS (SEPSIS)
1000.0000 mL | Freq: Once | INTRAVENOUS | Status: AC
  Administered 2013-01-30: 1000 mL via INTRAVENOUS

## 2013-01-30 MED ORDER — PENICILLIN G BENZATHINE 1200000 UNIT/2ML IM SUSP
1.2000 10*6.[IU] | Freq: Once | INTRAMUSCULAR | Status: AC
  Administered 2013-01-30: 1.2 10*6.[IU] via INTRAMUSCULAR
  Filled 2013-01-30: qty 2

## 2013-01-30 MED ORDER — IOHEXOL 300 MG/ML  SOLN
100.0000 mL | Freq: Once | INTRAMUSCULAR | Status: AC | PRN
  Administered 2013-01-30: 100 mL via INTRAVENOUS

## 2013-01-30 NOTE — ED Notes (Signed)
MD at bedside. 

## 2013-01-30 NOTE — ED Notes (Signed)
[id number]> Expected date:<BR> Expected time:<BR> Means of arrival:<BR> Comments:<BR> Triage 1

## 2013-01-30 NOTE — ED Notes (Addendum)
Pt states that she has had a sore throat since Wednesday.  States that since then, it has swollen and states it feels hard for her to breathe.  Pt unable to swallow.  Spitting into an emesis bag.  Pt does not appear to be SOB at the moment.  VSS.  Pt's throat is red, swollen.  Tonsils protruding out into pharynx.  Almost touching each other.

## 2013-01-30 NOTE — ED Provider Notes (Signed)
MSE was initiated and I personally evaluated the patient and placed orders (if any) at  2:01 PM on Jan 30, 2013.  The patient has had a worsening sore throat for the past 2 days. She denies fever. On exam she is spitting into a bag because she feels as though she cannot swallow. She has significant tonsillar swelling, tender cervical adenopathy, and mild trismus. I put in for a soft tissue neck CT with contrast. She is stable to be moved to a room in the main ED when one becomes available.  Mora Bellman, PA-C 01/30/13 [phone]

## 2013-01-30 NOTE — ED Provider Notes (Addendum)
History     CSN: [id number]  Arrival date & time 01/30/13  1318   First MD Initiated Contact with Patient 01/30/13 1500      Chief Complaint  Patient presents with  . Sore Throat  . Oral Swelling    (Consider location/radiation/quality/duration/timing/severity/associated sxs/prior treatment) Patient is a 22 y.o. female presenting with pharyngitis. The history is provided by the patient.  Sore Throat This is a new problem. The current episode started more than 2 days ago. The problem occurs constantly. The problem has been gradually worsening. Pertinent negatives include no abdominal pain and no shortness of breath. Associated symptoms comments: States can't swallow due to pain.. The symptoms are aggravated by swallowing. Nothing relieves the symptoms. She has tried nothing for the symptoms. The treatment provided no relief.    Past Medical History  Diagnosis Date  . PID (acute pelvic inflammatory disease)   . BV (bacterial vaginosis)   . Migraine     Past Surgical History  Procedure Laterality Date  . No past surgeries      Family History  Problem Relation Age of Onset  . Cancer Maternal Aunt     lung cancer  . Cancer Cousin     lung    History  Substance Use Topics  . Smoking status: Former Smoker -- 3 years    Quit date: 12/08/2012  . Smokeless tobacco: Never Used  . Alcohol Use: Yes     Comment: occasional    OB History   Grav Para Term Preterm Abortions TAB SAB Ect Mult Living   0               Review of Systems  Constitutional: Positive for fever.  HENT: Positive for trouble swallowing and voice change. Negative for drooling, dental problem and sinus pressure.   Respiratory: Negative for cough and shortness of breath.   Gastrointestinal: Negative for abdominal pain.  All other systems reviewed and are negative.    Allergies  Review of patient's allergies indicates no known allergies.  Home Medications  No current outpatient prescriptions on  file.  BP 110/76  Pulse 106  Temp(Src) 98.4 F (36.9 C) (Oral)  Resp 16  SpO2 99%  LMP 01/27/2013  Physical Exam  Nursing note and vitals reviewed. Constitutional: She is oriented to person, place, and time. She appears well-developed and well-nourished. She appears distressed.  HENT:  Head: Normocephalic and atraumatic. There is trismus in the jaw.  Right Ear: Tympanic membrane and ear canal normal.  Left Ear: Tympanic membrane and ear canal normal.  Nose: Nose normal. No mucosal edema or rhinorrhea.  Mouth/Throat: Oropharyngeal exudate, posterior oropharyngeal edema and posterior oropharyngeal erythema present. No tonsillar abscesses.  Very mild trismus.  Eyes: Conjunctivae and EOM are normal. Pupils are equal, round, and reactive to light.  Neck: Normal range of motion. Neck supple.  Cardiovascular: Normal rate, regular rhythm and intact distal pulses.   No murmur heard. Pulmonary/Chest: Effort normal and breath sounds normal. No respiratory distress. She has no wheezes. She has no rales.  No respiratory difficulty  Abdominal: Soft. She exhibits no distension. There is no tenderness. There is no rebound and no guarding.  Musculoskeletal: Normal range of motion. She exhibits no edema and no tenderness.  Neurological: She is alert and oriented to person, place, and time.  Skin: Skin is warm and dry. No rash noted. No erythema.  Psychiatric: She has a normal mood and affect. Her behavior is normal.    ED Course  Procedures (including critical care time)  Labs Reviewed  RAPID STREP SCREEN - Abnormal; Notable for the following:    Streptococcus, Group A Screen (Direct) POSITIVE (*)    All other components within normal limits   Ct Soft Tissue Neck W Contrast  01/30/2013  *RADIOLOGY REPORT*  Clinical Data: Sore throat with painful swelling for past 2 days.  CT NECK WITH CONTRAST  Technique:  Multidetector CT imaging of the neck was performed with intravenous contrast.  Contrast:  OMNIPAQUE IOHEXOL 300 MG/ML  SOLN  Comparison: None.  Findings: Marked enlargement of Waldeyers ring including the palatine tonsils.  Significant adenopathy throughout the neck. Findings are suggestive of infection given the clinical presentation without well-defined drainable abscess, extension of inflammatory process into the parapharyngeal space or retropharyngeal space.  No evidence of septic thrombophlebitis.  Lung apices are clear.  Reversal of the normal cervical reduces may be related to splinting or head position.  Polypoid opacification right maxillary sinus.  IMPRESSION:  Marked enlargement of Waldeyers ring including the palatine tonsils.  Significant adenopathy throughout the neck.  Findings are suggestive of infection given the clinical presentation without well-defined drainable abscess, extension of inflammatory process into the parapharyngeal space or retropharyngeal space.  Please see above   Original Report Authenticated By: Lacy Duverney, M.D.      1. Strep pharyngitis       MDM   Patient symptoms significant for sore throat and difficulty swallowing. She has hyperemia of her throat and bilateral tonsillar edema. She has cervical adenopathy that is tender.  She has stable vital signs but does appear uncomfortable on exam. CT was negative for peritonsillar abscess, retropharyngeal abscess or epiglottitis. There is no drainable abscess at this time and feel that patient's symptoms are just from severe sleep strep throat. She was given IV fluids, pain medicine and Decadron to see if that helps symptom improvement as currently she refuses to swallow.  5:02 PM Pt feeling much better and now swallowing.  Will d/c home.     Gwyneth Sprout, MD 01/30/13 1702  Gwyneth Sprout, MD 01/30/13 1705

## 2013-01-31 NOTE — ED Provider Notes (Signed)
Medical screening examination/treatment/procedure(s) were performed by non-physician practitioner and as supervising physician I was immediately available for consultation/collaboration.    Caylan Chenard D Bina Veenstra, MD 01/31/13 2121 

## 2013-03-26 ENCOUNTER — Ambulatory Visit: Payer: Medicaid Other

## 2013-04-01 ENCOUNTER — Ambulatory Visit: Payer: Medicaid Other

## 2013-04-05 ENCOUNTER — Ambulatory Visit (INDEPENDENT_AMBULATORY_CARE_PROVIDER_SITE_OTHER): Payer: Medicaid Other | Admitting: *Deleted

## 2013-04-05 DIAGNOSIS — Z309 Encounter for contraceptive management, unspecified: Secondary | ICD-10-CM

## 2013-04-05 MED ORDER — MEDROXYPROGESTERONE ACETATE 150 MG/ML IM SUSP
150.0000 mg | Freq: Once | INTRAMUSCULAR | Status: AC
  Administered 2013-04-05: 150 mg via INTRAMUSCULAR

## 2013-04-05 NOTE — Progress Notes (Signed)
Pt here for depo. Depo given. Next depo duesept 22-oct 6 - reminder card given Wyatt Haste, RN-BSN

## 2013-04-15 ENCOUNTER — Ambulatory Visit: Payer: Medicaid Other | Admitting: Family Medicine

## 2013-04-19 ENCOUNTER — Ambulatory Visit: Payer: Medicaid Other | Admitting: Family Medicine

## 2013-04-26 ENCOUNTER — Ambulatory Visit: Payer: Medicaid Other | Admitting: Family Medicine

## 2013-05-13 ENCOUNTER — Ambulatory Visit (INDEPENDENT_AMBULATORY_CARE_PROVIDER_SITE_OTHER): Payer: Medicaid Other | Admitting: Family Medicine

## 2013-05-13 ENCOUNTER — Encounter: Payer: Self-pay | Admitting: Family Medicine

## 2013-05-13 VITALS — BP 110/70 | HR 82 | Ht 63.0 in | Wt 109.0 lb

## 2013-05-13 DIAGNOSIS — L03317 Cellulitis of buttock: Secondary | ICD-10-CM

## 2013-05-13 DIAGNOSIS — L0231 Cutaneous abscess of buttock: Secondary | ICD-10-CM | POA: Insufficient documentation

## 2013-05-13 DIAGNOSIS — N898 Other specified noninflammatory disorders of vagina: Secondary | ICD-10-CM

## 2013-05-13 LAB — POCT WET PREP (WET MOUNT)

## 2013-05-13 MED ORDER — CEPHALEXIN 500 MG PO CAPS: 500.0000 mg | ORAL_CAPSULE | Freq: Three times a day (TID) | ORAL | Status: DC

## 2013-05-13 MED ORDER — IBUPROFEN 600 MG PO TABS: 600.0000 mg | ORAL_TABLET | Freq: Three times a day (TID) | ORAL | Status: DC | PRN

## 2013-05-13 NOTE — Assessment & Plan Note (Signed)
nml physiologic Wet prep nml. No intervention required

## 2013-05-13 NOTE — Patient Instructions (Addendum)
You had an abscess and serious skin infection We drained the abscess You will need to take antibiotics for the next 7 days Please keep the area of the infection clean and dry Please take motrin/advil/ or ibuprofen 600mg  every 6 hours for the next 1-2 days for pain and swelling We will call you if any of your other lab work comes back abnromal Have a great day  Abscess An abscess is an infected area that contains a collection of pus and debris.It can occur in almost any part of the body. An abscess is also known as a furuncle or boil. CAUSES  An abscess occurs when tissue gets infected. This can occur from blockage of oil or sweat glands, infection of hair follicles, or a minor injury to the skin. As the body tries to fight the infection, pus collects in the area and creates pressure under the skin. This pressure causes pain. People with weakened immune systems have difficulty fighting infections and get certain abscesses more often.  SYMPTOMS Usually an abscess develops on the skin and becomes a painful mass that is red, warm, and tender. If the abscess forms under the skin, you may feel a moveable soft area under the skin. Some abscesses break open (rupture) on their own, but most will continue to get worse without care. The infection can spread deeper into the body and eventually into the bloodstream, causing you to feel ill.  DIAGNOSIS  Your caregiver will take your medical history and perform a physical exam. A sample of fluid may also be taken from the abscess to determine what is causing your infection. TREATMENT  Your caregiver may prescribe antibiotic medicines to fight the infection. However, taking antibiotics alone usually does not cure an abscess. Your caregiver may need to make a small cut (incision) in the abscess to drain the pus. In some cases, gauze is packed into the abscess to reduce pain and to continue draining the area. HOME CARE INSTRUCTIONS   Only take over-the-counter or  prescription medicines for pain, discomfort, or fever as directed by your caregiver.  If you were prescribed antibiotics, take them as directed. Finish them even if you start to feel better.  If gauze is used, follow your caregiver's directions for changing the gauze.  To avoid spreading the infection:  Keep your draining abscess covered with a bandage.  Wash your hands well.  Do not share personal care items, towels, or whirlpools with others.  Avoid skin contact with others.  Keep your skin and clothes clean around the abscess.  Keep all follow-up appointments as directed by your caregiver. SEEK MEDICAL CARE IF:   You have increased pain, swelling, redness, fluid drainage, or bleeding.  You have muscle aches, chills, or a general ill feeling.  You have a fever. MAKE SURE YOU:   Understand these instructions.  Will watch your condition.  Will get help right away if you are not doing well or get worse. Document Released: 06/26/2005 Document Revised: 03/17/2012 Document Reviewed: 11/29/2011 [facility] Patient Information 2014 [place], [place].

## 2013-05-13 NOTE — Progress Notes (Signed)
Candice White is a 22 y.o. female who presents to [facility] today for bump on butt  Bump: started Monday. L cheek. Painful initially,. Red. Now pussing. Denies trauma. Denies fevers, rash.   Vaginal discharge.: star5ted 2 or more weeks ago. Worsening. Greenish white. Denies vaginal irritation. Uses condoms for sex and no sex since March. Denies recent antibiotics. Non-itchy or irritaiton. Denies abd pain.   The following portions of the patient's history were reviewed and updated as appropriate: allergies, current medications, past medical history, family and social history, and problem list.  Patient is a nonsmoker.  Past Medical History  Diagnosis Date  . PID (acute pelvic inflammatory disease)   . BV (bacterial vaginosis)   . Migraine     ROS as above otherwise neg.    Medications reviewed. Current Outpatient Prescriptions  Medication Sig Dispense Refill  . HYDROcodone-acetaminophen (LORTAB) 7.5-500 MG/15ML solution Take 15 mLs by mouth every 6 (six) hours as needed for pain.  120 mL  0   No current facility-administered medications for this visit.    Exam:  BP 110/70  Pulse 82  Ht 5\' 3"  (1.6 m)  Wt 109 lb (49.442 kg)  BMI 19.31 kg/m2 Gen: Well NAD HEENT: EOMI,  MMM GU: labia nml. Vaginal walls well rugated adn pink. No lesions SKin: erythematous indurated lesion on L buttock, no DC present  No results found for this or any previous visit (from the past 72 hour(s)).   .Incision and Drainage Procedure Note  Pre-operative Diagnosis: abscess and cellulitis  Post-operative Diagnosis: same  Indications: pain and fluctuance  Anesthesia: 2% plain lidocaine, ethyl chloride spray  Procedure Details  The procedure, risks and complications have been discussed in detail (including, but not limited to airway compromise, infection, bleeding) with the patient, and the patient has signed consent to the procedure.  The skin was sterilely prepped and draped over the affected area in  the usual fashion. After adequate local anesthesia, I&D with a #11 blade was performed on the left, lateral buttock. Purulent drainage: present, copious irrigation w/ NS until purulent drainage stopped. Wound probed w/ soft steril qtip until all loculations were disrupted The patient was observed until stable.   EBL: 10 cc's  Drains: none  Condition: Tolerated procedure well   Complications: none.

## 2013-05-13 NOTE — Assessment & Plan Note (Signed)
Abscess drained successfully Keflex TID x 7 days f/u w/ me on 8/22 Precautions given Motrin 600 QID  Work note given [employer] sent

## 2013-05-14 ENCOUNTER — Telehealth: Payer: Self-pay | Admitting: *Deleted

## 2013-05-14 NOTE — Telephone Encounter (Signed)
Pt number not working.  Left message on mothers number for pt to call back and get results of wet prep.  It was normal and there is no infection.   Shanie Mauzy,CMA

## 2013-05-14 NOTE — Telephone Encounter (Signed)
Message copied by Henri Medal on Fri May 14, 2013  2:44 PM ------      Message from: [facility], DAVID J      Created: Thu May 13, 2013  3:44 PM       Please inform pt that wet prep nml. No signs of overt infection. Discharge is nml            ----- Message -----         From: Bonnie Swaziland         Sent: 05/13/2013   2:19 PM           To: Ozella Rocks, MD                   ------

## 2013-05-16 LAB — WOUND CULTURE: Gram Stain: NONE SEEN

## 2013-05-21 ENCOUNTER — Ambulatory Visit: Payer: Medicaid Other | Admitting: Family Medicine

## 2013-05-24 ENCOUNTER — Telehealth: Payer: Self-pay | Admitting: Family Medicine

## 2013-05-24 NOTE — Telephone Encounter (Signed)
Patient is calling because she would like to speak to Dr. Konrad Dolores about her having bleeding and cramping after she has sex.

## 2013-05-25 NOTE — Telephone Encounter (Signed)
Pt reports cramping and bleeding after sex - small amount - suggested appointment for further eval - pt verbalized understanding - appointment made for cross cover at 830 wed. Wyatt Haste, RN-BSN

## 2013-05-25 NOTE — Telephone Encounter (Signed)
To triage nurse. Harmoney Sienkiewicz Dawn  

## 2013-05-26 ENCOUNTER — Ambulatory Visit: Payer: Medicaid Other

## 2013-06-21 ENCOUNTER — Ambulatory Visit: Payer: Medicaid Other

## 2013-07-12 ENCOUNTER — Telehealth: Payer: Self-pay | Admitting: Family Medicine

## 2013-07-12 NOTE — Telephone Encounter (Signed)
Appt made for 07/14/2013 for fullness. Candice White,CMA

## 2013-07-12 NOTE — Telephone Encounter (Signed)
Pt called because when she urinates she feels like she still has to go and that her bladder is still full. JW

## 2013-07-14 ENCOUNTER — Ambulatory Visit (INDEPENDENT_AMBULATORY_CARE_PROVIDER_SITE_OTHER): Payer: Medicaid Other | Admitting: Sports Medicine

## 2013-07-14 VITALS — BP 107/71 | HR 84 | Temp 98.1°F | Wt 113.0 lb

## 2013-07-14 DIAGNOSIS — N912 Amenorrhea, unspecified: Secondary | ICD-10-CM

## 2013-07-14 DIAGNOSIS — N390 Urinary tract infection, site not specified: Secondary | ICD-10-CM

## 2013-07-14 DIAGNOSIS — Z3009 Encounter for other general counseling and advice on contraception: Secondary | ICD-10-CM

## 2013-07-14 LAB — POCT URINALYSIS DIPSTICK
Bilirubin, UA: NEGATIVE
Nitrite, UA: NEGATIVE
Protein, UA: >=300
Urobilinogen, UA: 0.2
pH, UA: 6.5

## 2013-07-14 MED ORDER — NITROFURANTOIN MONOHYD MACRO 100 MG PO CAPS: 100.0000 mg | ORAL_CAPSULE | Freq: Two times a day (BID) | ORAL | Status: DC

## 2013-07-14 NOTE — Assessment & Plan Note (Addendum)
Large leukocytes, blood, protein.  No nitrites. Treat with Macrodantin x7 days as no evidence of upper tract disease. Reviewed post coitus hygiene > Follow up culture

## 2013-07-14 NOTE — Assessment & Plan Note (Signed)
Patient interested in getting pregnant at this time. Urine pregnancy test negative. Last Depo shot 4 months ago.

## 2013-07-14 NOTE — Patient Instructions (Signed)
   Antibiotics for 7 days.  Followup if persistent symptoms.  Ibuprofen for discomfort if needed.   If you need anything prior to your next visit please call the clinic. Please Bring all medications or accurate medication list with you to each appointment; an accurate medication list is essential in providing you the best care possible.

## 2013-07-14 NOTE — Progress Notes (Signed)
[facility] Candice White - 22 y.o. female MRN [id number]  Date of birth: 1991-06-17  CC, HPI, INTERVAL HISTORY & ROS  Candice White is here today for dysuria.   2 weeks of frequency worsening over the past 2 days. Frequent nocturia, and postvoid fullness, mild dysuria. No vaginal discharge or vaginal odor Patient does report a new odor to her urine Pt denies any fevers, chills, or rigors.  No body aches Unknown last LMP.  Has been on Depo, interested in getting pregnant, last shot at the beginning of September. Patient is sexually active  History  Past Medical, Surgical, Social, and Family History Reviewed per EMR Medications and Allergies reviewed and all updated if necessary. Objective Findings  VITALS: HR: 84 bpm  BP: 107/71 mmHg  TEMP: 98.1 F (36.7 C) (Oral)  RESP:    HT:    WT: 113 lb (51.256 kg)  BMI:     BP Readings from Last 3 Encounters:  07/14/13 107/71  05/13/13 110/70  01/30/13 112/72   Wt Readings from Last 3 Encounters:  07/14/13 113 lb (51.256 kg)  05/13/13 109 lb (49.442 kg)  01/05/13 113 lb (51.256 kg)     PHYSICAL EXAM: GENERAL:  young adult AA female. In no discomfort; no respiratory distress  PSYCH: alert and appropriate, good insight   HNEENT:   CARDIO: RRR, S1/S2 heard, no murmur  LUNGS:  exam lung   ABDOMEN:  suprapubic tenderness present, no CVA tenderness, negative Lloyd sign bilaterally.    EXTREM:    GU:  deferred   SKIN:     Assessment & Plan   Problems addressed today: General Plan & Pt Instructions:  1. UTI (urinary tract infection)   2. Amenorrhea   3. Family planning       Antibiotics for 7 days.  Followup if persistent symptoms.  Ibuprofen for discomfort if needed.     For further discussion of A/P and for follow up issues see problem based charting if applicable.

## 2013-07-17 LAB — URINE CULTURE: Colony Count: >=100000

## 2013-08-05 ENCOUNTER — Ambulatory Visit (INDEPENDENT_AMBULATORY_CARE_PROVIDER_SITE_OTHER): Payer: Medicaid Other | Admitting: Family Medicine

## 2013-08-05 ENCOUNTER — Other Ambulatory Visit (HOSPITAL_COMMUNITY)
Admission: RE | Admit: 2013-08-05 | Discharge: 2013-08-05 | Disposition: A | Discharge status: Home / Self Care | Payer: Medicaid Other | Source: Ambulatory Visit | Attending: Family Medicine | Admitting: Family Medicine

## 2013-08-05 ENCOUNTER — Encounter: Payer: Self-pay | Admitting: Family Medicine

## 2013-08-05 VITALS — BP 101/68 | HR 105 | Temp 98.5°F | Wt 108.0 lb

## 2013-08-05 DIAGNOSIS — J029 Acute pharyngitis, unspecified: Secondary | ICD-10-CM

## 2013-08-05 DIAGNOSIS — N898 Other specified noninflammatory disorders of vagina: Secondary | ICD-10-CM

## 2013-08-05 DIAGNOSIS — N939 Abnormal uterine and vaginal bleeding, unspecified: Secondary | ICD-10-CM

## 2013-08-05 DIAGNOSIS — Z113 Encounter for screening for infections with a predominantly sexual mode of transmission: Secondary | ICD-10-CM | POA: Insufficient documentation

## 2013-08-05 LAB — POCT URINE PREGNANCY: Preg Test, Ur: NEGATIVE

## 2013-08-05 NOTE — Patient Instructions (Signed)
It was nice meeting you Ms kiara, mcdowell seem you are spotting since you missed you Depo,it is called withdrawal bleeding. This might last for a while and reolve completely, if this however continues,we might need to get a pelvic U/S. I will check you for anemia today and call you with result.Sore Throat A sore throat is a painful, burning, sore, or scratchy feeling of the throat. There may be pain or tenderness when swallowing or talking. You may have other symptoms with a sore throat. These include coughing, sneezing, fever, or a swollen neck. A sore throat is often the first sign of another sickness. These sicknesses may include a cold, flu, strep throat, or an infection called mono. Most sore throats go away without medical treatment.  HOME CARE   Only take medicine as told by your doctor.  Drink enough fluids to keep your pee (urine) clear or pale yellow.  Rest as needed.  Try using throat sprays, lozenges, or suck on hard candy (if older than 4 years or as told).  Sip warm liquids, such as broth, herbal tea, or warm water with honey. Try sucking on frozen ice pops or drinking cold liquids.  Rinse the mouth (gargle) with salt water. Mix 1 teaspoon salt with 8 ounces of water.  Do not smoke. Avoid being around others when they are smoking.  Put a humidifier in your bedroom at night to moisten the air. You can also turn on a hot shower and sit in the bathroom for 5 10 minutes. Be sure the bathroom door is closed. GET HELP RIGHT AWAY IF:   You have trouble breathing.  You cannot swallow fluids, soft foods, or your spit (saliva).  You have more puffiness (swelling) in the throat.  Your sore throat does not get better in 7 days.  You feel sick to your stomach (nauseous) and throw up (vomit).  You have a fever or lasting symptoms for more than 2 3 days.  You have a fever and your symptoms suddenly get worse. MAKE SURE YOU:   Understand these instructions.  Will watch your  condition.  Will get help right away if you are not doing well or get worse. Document Released: 06/25/2008 Document Revised: 06/10/2012 Document Reviewed: 05/24/2012 [facility] Patient Information 2014 [place], [place].

## 2013-08-05 NOTE — Assessment & Plan Note (Signed)
Likely viral. Censor score of zero. I reassured her antibiotic is not needed. Warm saline gurgle and tylenol recommended prn pain. RTC soon if worsening.

## 2013-08-05 NOTE — Progress Notes (Signed)
Subjective:     Patient ID: Candice White, female   DOB: [birth year], 22 y.o.   MRN: [id number]  HPI Sore Throat; This has been on going on for 1 wk. Pain is about 5/10 in severity, described soreness and swelling. No fever,she is coughing since yesterday, it feels like throat irritation.Boy friend has been coughing. Vaginal Discharge: Been going on and off since she missed her Depo shot in Sept. Nothing for birth control now. Last checked for pregnancy in Oct was negative. Occasionally suprapubic sharp pain is associated with her discharge,no urinary symptom. Feels tired and dizzy at times. Current episode of spotting has been going on for few weeks.  No current outpatient prescriptions on file prior to visit.   No current facility-administered medications on file prior to visit.   Past Medical History  Diagnosis Date  . PID (acute pelvic inflammatory disease)   . BV (bacterial vaginosis)   . Migraine       Review of Systems  Respiratory: Negative.   Cardiovascular: Negative.   Gastrointestinal: Positive for abdominal pain. Negative for nausea.       Suprapubic cramping on and off.  Genitourinary: Positive for vaginal bleeding. Negative for dysuria, frequency, hematuria and vaginal discharge.   Filed Vitals:   08/05/13 1456  BP: 101/68  Pulse: 105  Temp: 98.5 F (36.9 C)  TempSrc: Oral  Weight: 108 lb (48.988 kg)       Objective:   Physical Exam  Nursing note and vitals reviewed. Constitutional: She appears well-developed.  Cardiovascular: Normal rate, regular rhythm, normal heart sounds and intact distal pulses.   No murmur heard. Pulmonary/Chest: Effort normal and breath sounds normal. No respiratory distress. She has no wheezes. She exhibits no tenderness.  Abdominal: Soft. Bowel sounds are normal. She exhibits no distension and no mass. There is no tenderness.  Genitourinary: Cervix exhibits no motion tenderness, no discharge and no friability. Right adnexum displays  no mass. Left adnexum displays no mass.         Assessment:     Pharyngitis Vaginal bleeding     Plan:     Check problem list

## 2013-08-05 NOTE — Assessment & Plan Note (Signed)
Likely withdrawal bleed from stopping Depo. She does not want to get back on birth control for now. Does not seem to have infection but GC/Chlamydia obtained since she is sexually active. No urinary symptom so UA not obtained. Urine pregnancy test negative. No gross bleeding hence CBC not done. Patient reassured.  F/U if persisting.

## 2013-08-06 ENCOUNTER — Telehealth: Payer: Self-pay | Admitting: Family Medicine

## 2013-08-06 NOTE — Telephone Encounter (Signed)
Pt called and would like the results from her lab work 11/6. If she doesn't answer leave her a message. jw

## 2013-08-06 NOTE — Telephone Encounter (Signed)
Message left, pregnancy test is negative.

## 2013-08-30 ENCOUNTER — Ambulatory Visit: Payer: Medicaid Other | Admitting: Family Medicine

## 2013-09-07 ENCOUNTER — Ambulatory Visit: Payer: Medicaid Other | Admitting: Family Medicine

## 2013-09-14 ENCOUNTER — Ambulatory Visit: Payer: Medicaid Other | Admitting: Family Medicine

## 2013-09-20 ENCOUNTER — Ambulatory Visit: Payer: Medicaid Other | Admitting: Family Medicine

## 2013-10-12 ENCOUNTER — Ambulatory Visit: Payer: Medicaid Other | Admitting: Family Medicine

## 2013-12-10 ENCOUNTER — Emergency Department (HOSPITAL_COMMUNITY)
Admission: EM | Admit: 2013-12-10 | Discharge: 2013-12-10 | Disposition: A | Discharge status: Home / Self Care | Payer: Medicaid Other | Source: Home / Self Care

## 2013-12-10 ENCOUNTER — Encounter (HOSPITAL_COMMUNITY): Payer: Self-pay | Admitting: Emergency Medicine

## 2013-12-10 DIAGNOSIS — N912 Amenorrhea, unspecified: Secondary | ICD-10-CM

## 2013-12-10 LAB — POCT PREGNANCY, URINE: Preg Test, Ur: NEGATIVE

## 2013-12-10 NOTE — ED Notes (Signed)
Reports her last depo shot was last year.  Has not had a period since august 2014.  Wants pregnancy test

## 2013-12-10 NOTE — ED Provider Notes (Signed)
CSN: [id number]     Arrival date & time 12/10/13  1519 History   First MD Initiated Contact with Patient 12/10/13 1635     Chief Complaint  Patient presents with  . Possible Pregnancy   (Consider location/radiation/quality/duration/timing/severity/associated sxs/prior Treatment) HPI Comments: 23 year old female is asking questions if she is able to become pregnant. She was using Depakote injections a year ago for birth control and her last injection was in June of 2014. Since that time she is primarily amenorrheic. She will have episodic scant pinkish discharge. Denies pain in the abdomen or pelvis. On occasion will feel nauseated.   Past Medical History  Diagnosis Date  . PID (acute pelvic inflammatory disease)   . BV (bacterial vaginosis)   . Migraine    Past Surgical History  Procedure Laterality Date  . No past surgeries     Family History  Problem Relation Age of Onset  . Cancer Maternal Aunt     lung cancer  . Cancer Cousin     lung   History  Substance Use Topics  . Smoking status: Former Smoker -- 3 years    Quit date: 12/08/2012  . Smokeless tobacco: Never Used  . Alcohol Use: Yes     Comment: occasional   OB History   Grav Para Term Preterm Abortions TAB SAB Ect Mult Living   0              Review of Systems  Constitutional: Negative.   HENT: Negative.   Respiratory: Negative.   Cardiovascular: Negative.   Gastrointestinal: Positive for nausea. Negative for vomiting, abdominal pain, constipation and abdominal distention.  Genitourinary: Negative.  Negative for dysuria, urgency, decreased urine volume, vaginal pain and pelvic pain.  Skin: Negative for rash.  Neurological: Negative.     Allergies  Pollen extract  Home Medications  No current outpatient prescriptions on file. BP 89/55  Pulse 66  Temp(Src) 98.1 F (36.7 C) (Oral)  Resp 16  SpO2 100% Physical Exam  Nursing note and vitals reviewed. Constitutional: She is oriented to person, place,  and time. She appears well-developed and well-nourished. No distress.  Neck: Normal range of motion. Neck supple.  Cardiovascular: Normal rate and regular rhythm.   Pulmonary/Chest: Effort normal. No respiratory distress.  Abdominal: Soft. She exhibits no distension and no mass. There is no tenderness. There is no rebound and no guarding.  Genitourinary:  External pelvic exam reveals no tenderness with palpation. No palpable masses.  Musculoskeletal: She exhibits no edema.  Neurological: She is alert and oriented to person, place, and time. She exhibits normal muscle tone.  Skin: Skin is warm and dry.  Psychiatric: She has a normal mood and affect.    ED Course  Procedures (including critical care time) Labs Review Labs Reviewed  POCT PREGNANCY, URINE   Imaging Review No results found. Results for orders placed during the hospital encounter of 12/10/13  POCT PREGNANCY, URINE      Result Value Ref Range   Preg Test, Ur NEGATIVE  NEGATIVE     MDM   1. Amenorrhea    Pregnancy test neg F/U with PCP for assessment of your concern to become pregnant and the absence of abnormal menses.    Hayden Rasmussenavid Tristram Milian, NP 12/10/13 [phone]

## 2013-12-10 NOTE — Discharge Instructions (Signed)
Secondary Amenorrhea  Secondary amenorrhea is the stopping of menstrual flow for 3 6 months in a female who has previously had periods. There are many possible causes. Most of these causes are not serious. Usually, treating the underlying problem causing the loss of menses will return your periods to normal. CAUSES  Some common and uncommon causes of not menstruating include:  Malnutrition.  Low blood sugar (hypoglycemia).  Polycystic ovary disease.  Stress or fear.  Breastfeeding.  Hormone imbalance.  Ovarian failure.  Medicines.  Extreme obesity.  Cystic fibrosis.  Low body weight or drastic weight reduction from any cause.  Early menopause.  Removal of ovaries or uterus.  Contraceptives.  Illness.  Long-term (chronic) illnesses.  Cushing syndrome.  Thyroid problems.  Birth control pills, patches, or vaginal rings for birth control. RISK FACTORS You may be at greater risk of secondary amenorrhea if:  You have a family history of this condition.  You have an eating disorder.  You do athletic training. DIAGNOSIS  A diagnosis is made by your health care provider taking a medical history and doing a physical exam. This will include a pelvic exam to check for problems with your reproductive organs. Pregnancy must be ruled out. Often, numerous blood tests are done to measure different hormones in the body. Urine testing may be done. Specialized exams (ultrasound, CT scan, MRI, or hysteroscopy) may have to be done as well as measuring the body mass index (BMI). TREATMENT  Treatment depends on the cause of the amenorrhea. If an eating disorder is present, this can be treated with an adequate diet and therapy. Chronic illnesses may improve with treatment of the illness. Amenorrhea may be corrected with medicines, lifestyle changes, or surgery. If the amenorrhea cannot be corrected, it is sometimes possible to create a false menstruation with medicines. HOME CARE  INSTRUCTIONS  Maintain a healthy diet.  Manage weight problems.  Exercise regularly but not excessively.  Get adequate sleep.  Manage stress.  Be aware of changes in your menstrual cycle. Keep a record of when your periods occur. Note the date your period starts, how long it lasts, and any problems. SEEK MEDICAL CARE IF: Your symptoms do not get better with treatment. Document Released: 10/28/2006 Document Revised: 05/19/2013 Document Reviewed: 03/04/2013 [facility] Patient Information 2014 [place].  Menstruation Menstruation is the monthly passing of blood, tissue, fluid and mucus, also know as a period. Your body is shedding the lining of the uterus. The flow, or amount of blood, usually lasts from 3 7 days each month. Hormones control the menstrual cycle. Hormones are a chemical substance produced by endocrine glands in the body to regulate different bodily functions. The first menstrual period may start any time between age 41 years to 16 years. However, it usually starts around age 22 years. Some girls have regular monthly menstrual cycles right from the beginning. However, it is not unusual to have only a couple of drops of blood or spotting when you first start menstruating. It is also not unusual to have two periods a month or miss a month or two when first starting your periods. SYMPTOMS   Mild to moderate abdominal cramps.  Aching or pain in the lower back area. Symptoms may occur 5 10 days before your menstrual period starts. These symptoms are referred to as premenstrual syndrome (PMS). These symptoms can include:  Headache.  Breast tenderness and swelling.  Bloating.  Tiredness (fatigue).  Mood changes.  Craving for certain foods. These are normal signs and  symptoms and can vary in severity. To help relieve these problems, ask your caregiver if you can take over-the-counter medications for pain or discomfort. If the symptoms are not controllable, see your  caregiver for help.  HORMONES INVOLVED IN MENSTRUATION Menstruation comes about because of hormones produced by the pituitary gland in the brain and the ovaries that affect the uterine lining. First, the pituitary gland in the brain produces the hormone follicle stimulating hormone [facility]). FSH stimulates the ovaries to produce estrogen, which thickens the uterine lining and begins to develop an egg in the ovary. About 14 days later, the pituitary gland produces another hormone called luteinizing hormone (LH). LH causes the egg to come out of a sac in the ovary (ovulation). The empty sac on the ovary called the corpus luteum is stimulated by another hormone from the pituitary gland called luteotropin. The corpus luteum begins to produce the estrogen and progesterone hormone. The progesterone hormone prepares the lining of the uterus to have the fertilized egg (egg combined with sperm) attach to the lining of the uterus and begin to develop into a fetus. If the egg is not fertilized, the corpus luteum stops producing estrogen and progesterone, it disappears, the lining of the uterus sloughs off and a menstrual period begins. Then the menstrual cycle starts all over again and will continue monthly unless pregnancy occurs or menopause begins. The secretion of hormones is complex. Various parts of the body become involved in many chemical activities. Female sex hormones have other functions in a woman's body as well. Estrogen increases a woman's sex drive (libido). It naturally helps body get rid of fluids (diuretic). It also aids in the process of building new bone. Therefore, maintaining hormonal health is essential to all levels of a woman's well being. These hormones are usually present in normal amounts and cause you to menstruate. It is the relationship between the (small) levels of the hormones that is critical. When the balance is upset, menstrual irregularities can occur. HOW DOES THE MENSTRUAL CYCLE  HAPPEN?  Menstrual cycles vary in length from 21 35 days with an average of 29 days. The cycle begins on the first day of bleeding. At this time, the pituitary gland in the brain releases FSH that travels through the bloodstream to the ovaries. The [facility] stimulates the follicles in the ovaries. This prepares the body for ovulation that occurs around the 14th day of the cycle. The ovaries produce estrogen, and this makes sure conditions are right in the uterus for implantation of the fertilized egg.  When the levels of estrogen reach a high enough level, it signals the gland in the brain (pituitary gland) to release a surge of LH. This causes the release of the ripest egg from its follicle (ovulation). Usually only one follicle releases one egg, but sometimes more than one follicle releases an egg especially when stimulating the ovaries for in vitro fertilization. The egg can then be collected by either fallopian tube to await fertilization. The burst follicle within the ovary that is left behind is now called the corpus luteum or "yellow body." The corpus luteum continues to give off (secrete) reduced amounts of estrogen. This closes and hardens the cervix. It dries up the mucus to the naturally infertile condition.  The corpus luteum also begins to give off greater amounts of progesterone. This causes the lining of the uterus (endometrium) to thicken even more in preparation for the fertilized egg. The egg is starting to journey down from the fallopian tube  to the uterus. It also signals the ovaries to stop releasing eggs. It assists in returning the cervical mucus to its infertile state.  If the egg implants successfully into the womb lining and pregnancy occurs, progesterone levels will continue to raise. It is often this hormone that gives some pregnant women a feeling of well being, like a "natural high." Progesterone levels drop again after childbirth.  If fertilization does not occur, the corpus  luteum dies, stopping the production of hormones. This sudden drop in progesterone causes the uterine lining to break down, accompanied by blood (menstruation).  This starts the cycle back at day 1. The whole process starts all over again. Woman go through this cycle every month from puberty to menopause. Women have breaks only for pregnancy and breastfeeding (lactation), unless the woman has health problems that affect the female hormone system or chooses to use oral contraceptives to have unnatural menstrual periods. HOME CARE INSTRUCTIONS   Keep track of your periods by using a calendar.  If you use tampons, get the least absorbent to avoid toxic shock syndrome.  Do not leave tampons in the vagina over night or longer than 6 hours.  Wear a sanitary pad over night.  Exercise 3 5 times a week or more.  Avoid foods and drinks that you know will make your symptoms worse before or during your period. SEEK MEDICAL CARE IF:   You develop a fever with your period.  Your periods are lasting more than 7 days.  Your period is so heavy that you have to change pads or tampons every 30 minutes.  You develop clots with your period and never had clots before.  You cannot get relief from over-the-counter medication for your symptoms.  Your period has not started, and it has been longer than 35 days. Document Released: 09/06/2002 Document Revised: 07/07/2013 Document Reviewed: 04/15/2013 [facility] Patient Information 2014 [place], [place].

## 2013-12-11 NOTE — ED Provider Notes (Signed)
Medical screening examination/treatment/procedure(s) were performed by non-physician practitioner and as supervising physician I was immediately available for consultation/collaboration.  Leslee Homeavid Priscilla Kirstein, M.D.  Reuben Likesavid C Gianina Olinde, MD 12/11/13 [phone]

## 2013-12-13 ENCOUNTER — Encounter: Payer: Self-pay | Admitting: Family Medicine

## 2013-12-13 ENCOUNTER — Ambulatory Visit (INDEPENDENT_AMBULATORY_CARE_PROVIDER_SITE_OTHER): Payer: Medicaid Other | Admitting: Family Medicine

## 2013-12-13 ENCOUNTER — Other Ambulatory Visit (HOSPITAL_COMMUNITY)
Admission: RE | Admit: 2013-12-13 | Discharge: 2013-12-13 | Disposition: A | Discharge status: Home / Self Care | Payer: Medicaid Other | Source: Ambulatory Visit | Attending: Family Medicine | Admitting: Family Medicine

## 2013-12-13 VITALS — BP 115/77 | HR 66 | Temp 98.1°F | Wt 116.0 lb

## 2013-12-13 DIAGNOSIS — N912 Amenorrhea, unspecified: Secondary | ICD-10-CM

## 2013-12-13 DIAGNOSIS — Z3009 Encounter for other general counseling and advice on contraception: Secondary | ICD-10-CM

## 2013-12-13 DIAGNOSIS — N939 Abnormal uterine and vaginal bleeding, unspecified: Secondary | ICD-10-CM

## 2013-12-13 DIAGNOSIS — B373 Candidiasis of vulva and vagina: Secondary | ICD-10-CM

## 2013-12-13 DIAGNOSIS — N898 Other specified noninflammatory disorders of vagina: Secondary | ICD-10-CM

## 2013-12-13 DIAGNOSIS — B3731 Acute candidiasis of vulva and vagina: Secondary | ICD-10-CM

## 2013-12-13 DIAGNOSIS — Z113 Encounter for screening for infections with a predominantly sexual mode of transmission: Secondary | ICD-10-CM | POA: Insufficient documentation

## 2013-12-13 LAB — CBC
HEMATOCRIT: 38.4 % (ref 36.0–46.0)
HEMOGLOBIN: 12.6 g/dL (ref 12.0–15.0)
MCH: 29.4 pg (ref 26.0–34.0)
MCHC: 32.8 g/dL (ref 30.0–36.0)
MCV: 89.7 fL (ref 78.0–100.0)
Platelets: 331 10*3/uL (ref 150–400)
RBC: 4.28 MIL/uL (ref 3.87–5.11)
RDW: 13.8 % (ref 11.5–15.5)
WBC: 7.7 10*3/uL (ref 4.0–10.5)

## 2013-12-13 LAB — POCT WET PREP (WET MOUNT): Clue Cells Wet Prep Whiff POC: NEGATIVE

## 2013-12-13 LAB — POCT URINE PREGNANCY: Preg Test, Ur: NEGATIVE

## 2013-12-13 MED ORDER — FLUCONAZOLE 150 MG PO TABS: 150.0000 mg | ORAL_TABLET | Freq: Once | ORAL | Status: DC

## 2013-12-13 NOTE — Progress Notes (Signed)
Pt is aware of this. Jazmin Hartsell,CMA  

## 2013-12-13 NOTE — Assessment & Plan Note (Signed)
Abnormal periods since coming off the depo  Some abnormality in periods expected w/in normal range at this point TSH, Prolactin, CBC today Return in 4 wks and will consider OCP for period regulation for 1-3 months and then DC Also consider pelvic US to evaluate urterine architecture

## 2013-12-13 NOTE — Progress Notes (Signed)
Candice White is a 23 y.o. female who [facility]  Amenorrhea: no further birth control since June 2014 (depot shot should have lasted until September). LMP beginning of January. Lasted for 2 wks. [facility] every [facility] lasting 1 wk at a time. Seuxally active. Pt actively trying to become pregnant.   Vaginal Discharge: started 3 days ago. Irritation surounding the vaginal opening. No foul smell. Sexually active w/o protection  Skin bumps: off and on. Mildly painful. Unsure of triggers. No discharge or fever.   The following portions of the patient's history were reviewed and updated as appropriate: allergies, current medications, past medical history, family and social history, and problem list.  Patient is a nonsmoker.  Past Medical History  Diagnosis Date  . PID (acute pelvic inflammatory disease)   . BV (bacterial vaginosis)   . Migraine     ROS as above otherwise neg.    Medications reviewed. No current outpatient prescriptions on file.   No current facility-administered medications for this visit.    Exam:  BP 115/77  Pulse 66  Temp(Src) 98.1 F (36.7 C) (Oral)  Wt 116 lb (52.617 kg) Gen: Well NAD HEENT: EOMI,  MMM GU: labia nml. Significant thick white vaginal wall discharge. Cervix closed and w/o blood. Small amount of old blood in vagina. No cervical motion tenderness.   Results for orders placed in visit on 12/13/13 (from the past 72 hour(s))  POCT URINE PREGNANCY     Status: None   Collection Time    12/13/13  9:07 AM      Result Value Ref Range   Preg Test, Ur Negative      A/P (as seen in Problem list)  Discharge of vagina Possible STD vs other vaginal infection (yeast, BV) Gc, Chl, wet prep. HIV, RPR   Family planning Counseled on taking PNV if trying to become pregnant  Abnormal uterine bleeding Abnormal periods since coming off the depo  Some abnormality in periods expected w/in normal range at this  point TSH, Prolactin, CBC today Return in 4 wks and will consider OCP for period regulation for 1-3 months and then DC Also consider pelvic US to evaluate urterine architecture

## 2013-12-13 NOTE — Patient Instructions (Signed)
Please start a prenatal vitamin We will let you know if any of your lab work comes back abnormal Please use warm compresses on your bumps and do not squeeze them Please come back in 1 month.

## 2013-12-13 NOTE — Assessment & Plan Note (Signed)
Counseled on taking PNV if trying to become pregnant

## 2013-12-13 NOTE — Assessment & Plan Note (Addendum)
Possible STD vs other vaginal infection (yeast, BV) Gc, Chl, wet prep. HIV, RPR ------------------------------------  Yeast on wet prep Start diflucan

## 2013-12-13 NOTE — Addendum Note (Signed)
Addended by: Konrad DoloresMERRELL, Deadrian Toya J on: 12/13/2013 01:58 PM   Modules accepted: Orders

## 2013-12-14 LAB — PROLACTIN: PROLACTIN: 12.5 ng/mL

## 2013-12-14 LAB — TSH: TSH: 2.003 u[IU]/mL (ref 0.350–4.500)

## 2013-12-14 LAB — CERVICOVAGINAL ANCILLARY ONLY
CHLAMYDIA, DNA PROBE: NEGATIVE
NEISSERIA GONORRHEA: NEGATIVE

## 2014-02-24 ENCOUNTER — Ambulatory Visit: Payer: Medicaid Other | Admitting: Family Medicine

## 2014-03-07 ENCOUNTER — Ambulatory Visit: Payer: Medicaid Other | Admitting: Family Medicine

## 2014-03-17 ENCOUNTER — Ambulatory Visit: Payer: Medicaid Other | Admitting: Family Medicine

## 2014-03-31 ENCOUNTER — Ambulatory Visit: Payer: Medicaid Other | Admitting: Family Medicine

## 2014-04-04 ENCOUNTER — Ambulatory Visit: Payer: Medicaid Other | Admitting: Family Medicine

## 2014-04-06 ENCOUNTER — Ambulatory Visit (INDEPENDENT_AMBULATORY_CARE_PROVIDER_SITE_OTHER): Payer: Medicaid Other | Admitting: Family Medicine

## 2014-04-06 ENCOUNTER — Other Ambulatory Visit (HOSPITAL_COMMUNITY)
Admission: RE | Admit: 2014-04-06 | Discharge: 2014-04-06 | Disposition: A | Discharge status: Home / Self Care | Payer: Medicaid Other | Source: Ambulatory Visit | Attending: Family Medicine | Admitting: Family Medicine

## 2014-04-06 ENCOUNTER — Encounter: Payer: Self-pay | Admitting: Family Medicine

## 2014-04-06 VITALS — BP 113/69 | HR 69 | Temp 98.4°F | Wt 115.0 lb

## 2014-04-06 DIAGNOSIS — Z113 Encounter for screening for infections with a predominantly sexual mode of transmission: Secondary | ICD-10-CM

## 2014-04-06 DIAGNOSIS — B9689 Other specified bacterial agents as the cause of diseases classified elsewhere: Secondary | ICD-10-CM

## 2014-04-06 DIAGNOSIS — N760 Acute vaginitis: Secondary | ICD-10-CM

## 2014-04-06 DIAGNOSIS — N898 Other specified noninflammatory disorders of vagina: Secondary | ICD-10-CM

## 2014-04-06 DIAGNOSIS — A499 Bacterial infection, unspecified: Secondary | ICD-10-CM

## 2014-04-06 LAB — POCT WET PREP (WET MOUNT): CLUE CELLS WET PREP WHIFF POC: POSITIVE

## 2014-04-06 MED ORDER — FLUCONAZOLE 150 MG PO TABS: 150.0000 mg | ORAL_TABLET | Freq: Once | ORAL | Status: DC

## 2014-04-06 MED ORDER — METRONIDAZOLE 500 MG PO TABS: 500.0000 mg | ORAL_TABLET | Freq: Two times a day (BID) | ORAL | Status: DC

## 2014-04-06 NOTE — Patient Instructions (Signed)
It was nice to meet you today.  I will call you with the results if I need to send in a medication. If the results are negative you will get a letter in the mail. The STD tests will take a few more days to come back.  You are due for your first pap smear (you should have started getting them at age 23)... Please call and schedule this at your earliest convenience.

## 2014-04-06 NOTE — Progress Notes (Signed)
Patient ID: Claire ShownKazia M Lieber, female   DOB: [birth year], 23 y.o.   MRN: [id number]   Subjective:    Patient ID: Claire ShownKazia M Tomassetti, female    DOB: [birth year], 23 y.o.   MRN: [id number]  HPI  CC: vaginal discharge  # Discharge:  Started 2 weeks ago, "go away and come back"  White, smell to it in the last week; has increased in amount  Denies itchiness, pain, blood  Also interested in having STD testing ROS: no abdominal pain, no prolonged menstrual bleeding, no rectal bleeding or dysuria.   Review of Systems   See HPI for ROS. Objective:  BP 113/69  Pulse 69  Temp(Src) 98.4 F (36.9 C) (Oral)  Wt 115 lb (52.164 kg)  LMP 03/07/2014  General: NAD Speculum exam: normal external genitalia, tight introitus. Moderate amount of white discharge present in vaginal vault. Cervix normal appearing with no discharge. Bimanual not performed     Assessment & Plan:  See Problem List Documentation

## 2014-04-06 NOTE — Assessment & Plan Note (Signed)
Moderate amount white discharge, no bleeding. P: Wet prep and GC/chlamydia probe. Appears to be BV by wet prep. 7 day course flagyl 500mg  BID, also given rx for diflucan to be taken at end of antibiotic course to prevent yeast infection. Patient declines HIV and RPR today in office, may come back in future for it. Chart review after exam showed she has not had her pap smear, discussed she needs to get a pap done (thought speculum exams with wet preps were the same as pap smear). F/u in 1-2 months for pap.

## 2014-04-12 ENCOUNTER — Encounter: Payer: Self-pay | Admitting: Family Medicine

## 2014-04-19 ENCOUNTER — Ambulatory Visit (INDEPENDENT_AMBULATORY_CARE_PROVIDER_SITE_OTHER): Payer: Self-pay | Admitting: Family Medicine

## 2014-04-19 ENCOUNTER — Encounter: Payer: Self-pay | Admitting: Family Medicine

## 2014-04-19 VITALS — BP 97/49 | HR 89 | Temp 98.9°F | Wt 114.0 lb

## 2014-04-19 DIAGNOSIS — I10 Essential (primary) hypertension: Secondary | ICD-10-CM

## 2014-04-19 DIAGNOSIS — T50905A Adverse effect of unspecified drugs, medicaments and biological substances, initial encounter: Secondary | ICD-10-CM

## 2014-04-19 NOTE — Patient Instructions (Signed)
It was great seeing you today.   1. Stop taking the antibiotics. Return to clinic if your discharge returns or you develops fevers, chills or blood in your stool.    If you have any questions or concerns before then, please call the clinic at [phone].  Take Care,   Dr Wenda LowJames Koron Godeaux

## 2014-04-19 NOTE — Progress Notes (Signed)
   Subjective:    Patient ID: Candice White, female    DOB: 06-01-1991, 23 y.o.   MRN: [id number]  HPI Comments: She complains of nausea, vomiting, and diarrhea after starting Flagyl.  Reports she was able to take this for 5 days, but has discontinued it due to new GI symptoms.  She reports complete resolution of her vaginal discharge.  She has not noticed her nausea, vomiting, and diarrhea have improved after stopping the antibiotic.  She does report drinking some alcohol while on Flagyl.  She denies any fevers, chills, blood in her stool, or abdominal pain.   Review of Systems  See HPI      Objective:   Physical Exam  Constitutional: She appears well-developed.  Cardiovascular: Normal rate and regular rhythm.   Pulmonary/Chest: Effort normal and breath sounds normal.  Abdominal: Bowel sounds are normal. There is no tenderness. There is no guarding.  Skin: Skin is warm. No rash noted.   Assessment/Plan:      See Problem Focused Assessment & Plan

## 2014-08-12 ENCOUNTER — Ambulatory Visit: Payer: Medicaid Other | Admitting: Family Medicine

## 2014-08-16 ENCOUNTER — Ambulatory Visit (INDEPENDENT_AMBULATORY_CARE_PROVIDER_SITE_OTHER): Payer: Self-pay | Admitting: Family Medicine

## 2014-08-16 ENCOUNTER — Encounter: Payer: Self-pay | Admitting: Family Medicine

## 2014-08-16 ENCOUNTER — Other Ambulatory Visit (HOSPITAL_COMMUNITY)
Admission: RE | Admit: 2014-08-16 | Discharge: 2014-08-16 | Disposition: A | Discharge status: Home / Self Care | Payer: Self-pay | Source: Ambulatory Visit | Attending: Family Medicine | Admitting: Family Medicine

## 2014-08-16 VITALS — BP 106/72 | HR 72 | Temp 98.3°F | Wt 112.0 lb

## 2014-08-16 DIAGNOSIS — Z113 Encounter for screening for infections with a predominantly sexual mode of transmission: Secondary | ICD-10-CM | POA: Insufficient documentation

## 2014-08-16 DIAGNOSIS — N898 Other specified noninflammatory disorders of vagina: Secondary | ICD-10-CM

## 2014-08-16 LAB — POCT WET PREP (WET MOUNT): Clue Cells Wet Prep Whiff POC: NEGATIVE

## 2014-08-16 LAB — POCT URINE PREGNANCY: Preg Test, Ur: NEGATIVE

## 2014-08-16 NOTE — Progress Notes (Signed)
    Subjective   Candice White is a 23 y.o. female that presents for a same day visit  VAGINAL DISCHARGE  Onset: unsure, about a month  Description: thick white discharge Odor: no  Itching: no  Symptoms Dysuria: no  Bleeding: no  Pelvic pain: no  Back pain: no  Fever: no  Genital sores: no  Rash: no  Dyspareunia: no  GI Sxs: yes, diarrhea   Prior treatment: yes, most recently in July    Red Flags: Missed period: not normal since she has been off depo   Pregnancy: no, week ago was unprotected sex.  Recent antibiotics: no  Sexual activity: yes, unprotected about a week ago.   Possible STD exposure: no  IUD: no  Diabetes: no    History  Substance Use Topics  . Smoking status: Former Smoker -- 3 years    Quit date: 12/08/2012  . Smokeless tobacco: Never Used  . Alcohol Use: Yes     Comment: occasional    ROS Per HPI  Objective   BP 106/72 mmHg  Pulse 72  Temp(Src) 98.3 F (36.8 C) (Oral)  Wt 112 lb (50.803 kg)  General: well appearing, NAD, cooperative with exam.  Abd: No tenderness w/ palpation. No rebound or rigidity  GU: > External: no lesions, no LAD > Cervix: no lesion; no mucopurulent d/c;     Assessment and Plan   Please refer to problem based charting of assessment and plan

## 2014-08-16 NOTE — Patient Instructions (Signed)
Thank you for coming in,   We will call you with the results and will send in any antibiotics if need be.    Please feel free to call with any questions or concerns at any time, at [phone]. --Dr. Jordan LikesSchmitz

## 2014-08-16 NOTE — Assessment & Plan Note (Addendum)
White thick dc on exam. No fever. Recent hx of unprotected intercourse. Distant hx of treated chlamydia.  - GC/ Chlamydia: neg  - wet prep: neg  - urine preg: pending. - if still symptomatic may need to empirically treat with diflucan

## 2014-08-17 ENCOUNTER — Telehealth: Payer: Self-pay | Admitting: *Deleted

## 2014-08-17 LAB — CERVICOVAGINAL ANCILLARY ONLY
CHLAMYDIA, DNA PROBE: NEGATIVE
NEISSERIA GONORRHEA: NEGATIVE

## 2014-08-17 NOTE — Telephone Encounter (Signed)
Patient is unavailable at time

## 2014-08-17 NOTE — Telephone Encounter (Signed)
-----   Message from Myra RudeJeremy E Schmitz, MD sent at 08/17/2014  3:19 PM EST ----- Please call patient and inform that her wet prep was negative. Still waiting on GC/Chlamydia. If she is still symptomatic then may need to send in empiric diflucan. Thanks.

## 2014-08-18 ENCOUNTER — Telehealth: Payer: Self-pay | Admitting: *Deleted

## 2014-08-18 NOTE — Telephone Encounter (Signed)
-----   Message from Jeremy E Schmitz, MD sent at 08/17/2014  3:19 PM EST ----- Please call patient and inform that her wet prep was negative. Still waiting on GC/Chlamydia. If she is still symptomatic then may need to send in empiric diflucan. Thanks. 

## 2014-08-18 NOTE — Telephone Encounter (Signed)
LVM for patient to call back to informed her of below results.

## 2014-09-09 ENCOUNTER — Encounter: Payer: Self-pay | Admitting: Family Medicine

## 2014-09-30 NOTE — L&D Delivery Note (Signed)
Final Labor Progress Note At 1245 pt reports an increased in rectal pressure. FHR remained reassuring with occasional variable decelerations.  Vaginal Delivery Note The pt utilized an epidural as pain management.   Artificial rupture of membranes today, at 0645, lite mec.  GBS was negative.  Cervical dilation was complete at  1255.  NICHD Category 2.    Pushing with guidance began at  1258.   After 1 hour(s) and  12 minutes of pushing the head, shoulders and the body of a viable female infant "Sya" delivered spontaneously with maternal effort in the ROA position at 1409.   With vigorous tone and spontaneous cry, the infant was placed on moms abd.  After the umbilical cord was clamped it was cut by the pts brother.   Spontaneous delivery of a intact placenta with a 3 vessel cord via Shultz at 1447, then handed off to the cord blood team for donation.   Episiotomy: None   The vulva, perineum, vaginal vault, rectum and cervix were inspected and revealed a 2 degree vaginal, repaired using a 3-0 vicryl on a CT needle and a superficial left labial which was repaired using a 4-0 vicryl on a SH.  Lidocaine was not used, the epidural was sufficient for the repair.  Postpartum pitocin as ordered.  Fundus firm, lochia minimum, bleeding under control.  EBL 200, QBL pending, Pt hemodynamically stable.   Sponge, laps and needle count correct and verified with the primary care nurse.  Attending MD available at all times.    Routine postpartum orders Mother desires depo for contraception   Infant to have out patient circumcision   Placenta to pathology: NO     Cord Gases sent to lab: NO Cord blood sent to lab: YES   APGARS:  8 at 1 minute and 9 at 5 minutes Weight:. pending     Both mom and baby were left in stable condition      Yalanda Soderman, CNM, MSN 05/25/2015. 3:23 PM

## 2014-10-03 ENCOUNTER — Inpatient Hospital Stay (HOSPITAL_COMMUNITY)
Admission: AD | Admit: 2014-10-03 | Discharge: 2014-10-03 | Disposition: A | Discharge status: Home / Self Care | Payer: Medicaid Other | Source: Ambulatory Visit | Attending: Obstetrics and Gynecology | Admitting: Obstetrics and Gynecology

## 2014-10-03 ENCOUNTER — Encounter (HOSPITAL_COMMUNITY): Payer: Self-pay | Admitting: *Deleted

## 2014-10-03 DIAGNOSIS — Z3201 Encounter for pregnancy test, result positive: Secondary | ICD-10-CM | POA: Diagnosis not present

## 2014-10-03 DIAGNOSIS — O219 Vomiting of pregnancy, unspecified: Secondary | ICD-10-CM

## 2014-10-03 LAB — URINE MICROSCOPIC-ADD ON

## 2014-10-03 LAB — URINALYSIS, ROUTINE W REFLEX MICROSCOPIC
Bilirubin Urine: NEGATIVE
GLUCOSE, UA: NEGATIVE mg/dL
Ketones, ur: NEGATIVE mg/dL
LEUKOCYTES UA: NEGATIVE
NITRITE: NEGATIVE
PH: 5.5 (ref 5.0–8.0)
Protein, ur: NEGATIVE mg/dL
SPECIFIC GRAVITY, URINE: 1.01 (ref 1.005–1.030)
Urobilinogen, UA: 0.2 mg/dL (ref 0.0–1.0)

## 2014-10-03 LAB — POCT PREGNANCY, URINE: Preg Test, Ur: POSITIVE — AB

## 2014-10-03 MED ORDER — PRENATAL COMPLETE 14-0.4 MG PO TABS: 1.0000 | ORAL_TABLET | Freq: Every day | ORAL | Status: DC

## 2014-10-03 NOTE — MAU Note (Signed)
2+HPT this morning. Got sick while at work.  Has been having nausea off and on.  Cramping at times off and on, but no period.

## 2014-10-03 NOTE — Discharge Instructions (Signed)
Morning Sickness Morning sickness is when you feel sick to your stomach (nauseous) during pregnancy. You may feel sick to your stomach and throw up (vomit). You may feel sick in the morning, but you can feel this way any time of day. Some women feel very sick to their stomach and cannot stop throwing up (hyperemesis gravidarum). HOME CARE  Only take medicines as told by your doctor.  Take multivitamins as told by your doctor. Taking multivitamins before getting pregnant can stop or lessen the harshness of morning sickness.  Eat dry toast or unsalted crackers before getting out of bed.  Eat 5 to 6 small meals a day.  Eat dry and bland foods like rice and baked potatoes.  Do not drink liquids with meals. Drink between meals.  Do not eat greasy, fatty, or spicy foods.  Have someone cook for you if the smell of food causes you to feel sick or throw up.  If you feel sick to your stomach after taking prenatal vitamins, take them at night or with a snack.  Eat protein when you need a snack (nuts, yogurt, cheese).  Eat unsweetened gelatins for dessert.  Wear a bracelet used for sea sickness (acupressure wristband).  Go to a doctor that puts thin needles into certain body points (acupuncture) to improve how you feel.  Do not smoke.  Use a humidifier to keep the air in your house free of odors.  Get lots of fresh air. GET HELP IF:  You need medicine to feel better.  You feel dizzy or lightheaded.  You are losing weight. GET HELP RIGHT AWAY IF:   You feel very sick to your stomach and cannot stop throwing up.  You pass out (faint). MAKE SURE YOU:  Understand these instructions.  Will watch your condition.  Will get help right away if you are not doing well or get worse. Document Released: 10/24/2004 Document Revised: 09/21/2013 Document Reviewed: 03/03/2013 [facility] Patient Information 2015 [place], [place]. This information is not intended to replace advice given to you by  your health care provider. Make sure you discuss any questions you have with your health care provider.  Pregnancy Tests HOW DO PREGNANCY TESTS WORK? All pregnancy tests look for a special hormone in the urine or blood that is only present in pregnant women. This hormone, human chorionic gonadotropin (hCG), is also called the pregnancy hormone.  WHAT IS THE DIFFERENCE BETWEEN A URINE AND A BLOOD PREGNANCY TEST? IS ONE BETTER THAN THE OTHER? There are two types of pregnancy tests.  Blood tests.  Urine tests. Both tests look for the presence of hCG, the pregnancy hormone. Many women use a urine test or home pregnancy test (HPT) to find out if they are pregnant. HPTs are cheap, easy to use, can be done at home, and are private. When a woman has a positive result on an HPT, she needs to see her caregiver right away. The caregiver can confirm a positive HPT result with another urine test, a blood test, ultrasound, and a pelvic exam.  There are two types of blood tests you can get from a caregiver.   A quantitative blood test (or the beta hCG test). This test measures the exact amount of hCG in the blood. This means it can pick up very small amounts of hCG, making it a very accurate test.  A qualitative hCG blood test. This test gives a simple yes or no answer to whether you are pregnant. This test is more like a  urine test in terms of its accuracy. Blood tests can pick up hCG earlier in a pregnancy than urine tests can. Blood tests can tell if you are pregnant about 6 to 8 days after you release an egg from an ovary (ovulate). Urine tests can determine pregnancy about 2 weeks after ovulation.  HOW IS A HOME PREGNANCY TEST DONE?  There are many types of home pregnancy tests or HPTs that can be bought over-the-counter at drug or discount stores.   Some involve collecting your urine in a cup and dipping a stick into the urine or putting some of the urine into a special container with an  eyedropper.  Others are done by placing a stick into your urine stream.  Tests vary in how long you need to wait for the stick or container to turn a certain color or have a symbol on it (like a plus or a minus).  All tests come with written instructions. Most tests also have toll-free phone numbers to call if you have any questions about how to do the test or read the results. HOW ACCURATE ARE HOME PREGNANCY TESTS?  HPTs are very accurate. Most brands of HPTs say they are 97% to 99% accurate when taken 1 week after missing your menstrual period, but this can vary with actual use. Each brand varies in how sensitive it is in picking up the pregnancy hormone hCG. If a test is not done correctly, it will be less accurate. Always check the package to make sure it is not past its expiration date. If it is, it will not be accurate. Most brands of HPTs tell users to do the test again in a few days, no matter what the results.  If you use an HPT too early in your pregnancy, you may not have enough of the pregnancy hormone hCG in your urine to have a positive test result. Most HPTs will be accurate if you test yourself around the time your period is due (about 2 weeks after you ovulate). You can get a negative test result if you are not pregnant or if you ovulated later than you thought you did. You may also have problems with the pregnancy, which affects the amount of hCG you have in your urine. If your HPT is negative, test yourself again within a few days to 1 week. If you keep getting a negative result and think you are pregnant, talk with your caregiver right away about getting a blood pregnancy test.  FALSE POSITIVE PREGNANCY TEST A false positive HPT can happen if there is blood or protein present in your urine. A false positive can also happen if you were recently pregnant or if you take a pregnancy test too soon after taking fertility drug that contains hCG. Also, some prescription medicines such as water  pills (diuretics), tranquilizers, seizure medicines, psychiatric medicines, and allergy and nausea medicines (promethazine) give false positive readings. FALSE NEGATIVE PREGNANCY TEST  A false negative HPT can happen if you do the test too early. Try to wait until you are at least 1 day late for your menstrual period.  It may happen if you wait too long to test the urine (longer than 15 minutes).  It may also happen if the urine is too diluted because you drank a lot of fluids before getting the urine sample. It is best to test the first morning urine after you get out of bed. If your menstrual period did not start after a week of a  negative HPT, repeat the pregnancy test. CAN ANYTHING INTERFERE WITH HOME PREGNANCY TEST RESULTS?  Most medicines, both over-the-counter and prescription drugs, including birth control pills and antibiotics, should not affect the results of a HPT. Only those drugs that have the pregnancy hormone hCG in them can give a false positive test result. Drugs that have hCG in them may be used for treating infertility (not being able to get pregnant). Alcohol and illegal drugs do not affect HPT results, but you should not be using these substances if you are trying to get pregnant. If you have a positive pregnancy test, call your caregiver to make an appointment to begin prenatal care. Document Released: 09/19/2003 Document Revised: 12/09/2011 Document Reviewed: 12/31/2013 [facility] Patient Information 2015 [place], [place]. This information is not intended to replace advice given to you by your health care provider. Make sure you discuss any questions you have with your health care provider.

## 2014-10-03 NOTE — MAU Provider Note (Signed)
Subjective:  Candice White is a 24 y.o. female who presents to MAU for a pregnancy verifation letter.  She Denies vaginal bleeding. Did have some brown discharge 2 days ago, denies any today.  She has had some cramping that comes and goes.  Patient does not have pain now, and has not had pain today.    Objective:  GENERAL: Well-developed, well-nourished female in no acute distress.  HEENT: Normocephalic, atraumatic.   LUNGS: Effort normal SKIN: Warm, dry and without erythema ABDOMEN: Soft, non tender.  PSYCH: Normal mood and affect  Filed Vitals:   10/03/14 1547  BP: 100/62  Pulse: 80  Temp:   Resp: 18    Results for orders placed or performed during the hospital encounter of 10/03/14 (from the past 48 hour(s))  Urinalysis, Routine w reflex microscopic     Status: Abnormal   Collection Time: 10/03/14  2:15 PM  Result Value Ref Range   Color, Urine YELLOW YELLOW   APPearance CLEAR CLEAR   Specific Gravity, Urine 1.010 1.005 - 1.030   pH 5.5 5.0 - 8.0   Glucose, UA NEGATIVE NEGATIVE mg/dL   Hgb urine dipstick TRACE (A) NEGATIVE   Bilirubin Urine NEGATIVE NEGATIVE   Ketones, ur NEGATIVE NEGATIVE mg/dL   Protein, ur NEGATIVE NEGATIVE mg/dL   Urobilinogen, UA 0.2 0.0 - 1.0 mg/dL   Nitrite NEGATIVE NEGATIVE   Leukocytes, UA NEGATIVE NEGATIVE  Urine microscopic-add on     Status: Abnormal   Collection Time: 10/03/14  2:15 PM  Result Value Ref Range   Squamous Epithelial / LPF FEW (A) RARE  Pregnancy, urine POC     Status: Abnormal   Collection Time: 10/03/14  2:27 PM  Result Value Ref Range   Preg Test, Ur POSITIVE (A) NEGATIVE    Comment:        THE SENSITIVITY OF THIS METHODOLOGY IS >24 mIU/mL     MDM: Pregnancy test positive UA  Assessment:  1. Nausea/vomiting in pregnancy   2. Pregnancy test positive     Plan:  Discharge home in stable condition Pregnancy verification letter given Start prenatal care ASAP  First trimester warning signs  given Prenatal vitamins daily HD phone number/information given  A list of safe medications given to the patient including over the counter medication for nausea/ vomiting.     Iona Hansen Trana Ressler, NP 10/03/2014 3:27 PM

## 2014-10-04 ENCOUNTER — Inpatient Hospital Stay (HOSPITAL_COMMUNITY)
Admission: AD | Admit: 2014-10-04 | Discharge: 2014-10-04 | Discharge status: Against Medical Advice | Payer: Medicaid Other | Source: Ambulatory Visit | Attending: Obstetrics & Gynecology | Admitting: Obstetrics & Gynecology

## 2014-10-04 DIAGNOSIS — O9989 Other specified diseases and conditions complicating pregnancy, childbirth and the puerperium: Secondary | ICD-10-CM | POA: Diagnosis not present

## 2014-10-04 DIAGNOSIS — N898 Other specified noninflammatory disorders of vagina: Secondary | ICD-10-CM | POA: Diagnosis not present

## 2014-10-04 DIAGNOSIS — Z3A01 Less than 8 weeks gestation of pregnancy: Secondary | ICD-10-CM | POA: Insufficient documentation

## 2014-10-04 DIAGNOSIS — R109 Unspecified abdominal pain: Secondary | ICD-10-CM | POA: Diagnosis not present

## 2014-10-04 NOTE — MAU Note (Signed)
#  2 not in lobby 

## 2014-10-04 NOTE — MAU Note (Signed)
A lot of brown d/c. Cramping is worse today

## 2014-10-04 NOTE — MAU Note (Signed)
Urine in lab 

## 2014-10-04 NOTE — MAU Note (Signed)
#  3 not in lobby 

## 2014-10-12 ENCOUNTER — Ambulatory Visit (INDEPENDENT_AMBULATORY_CARE_PROVIDER_SITE_OTHER): Payer: Self-pay | Admitting: Family Medicine

## 2014-10-12 VITALS — BP 101/52 | HR 80 | Temp 98.7°F | Ht 62.0 in | Wt 110.2 lb

## 2014-10-12 DIAGNOSIS — R110 Nausea: Principal | ICD-10-CM

## 2014-10-12 DIAGNOSIS — O26899 Other specified pregnancy related conditions, unspecified trimester: Secondary | ICD-10-CM | POA: Insufficient documentation

## 2014-10-12 DIAGNOSIS — O210 Mild hyperemesis gravidarum: Secondary | ICD-10-CM

## 2014-10-12 MED ORDER — ONDANSETRON HCL 4 MG PO TABS: 4.0000 mg | ORAL_TABLET | Freq: Three times a day (TID) | ORAL | Status: DC | PRN

## 2014-10-12 NOTE — Progress Notes (Signed)
Patient ID: Candice White, female   DOB: 10/12/1990, 24 y.o.   MRN: [id number]   HPI  Patient presents today for nausea and abdominal cramping  Patient [facility] she was [facility] on [facility]. She states that just prior to that she had 2 days of dark brown/bloody discharge which had resolved. She was reassured there and diagnosed with pregnancy.  2 days later, January 8, she developed intermittent lower abdominal cramps similar to period Related cramping. She denies any vaginal bleeding since that time. She states that she's having normal stools, and tolerating food and fluid easily. She is having intermittent moderate nausea. This is her first pregnancy  Smoking status noted ROS: Per HPI  Objective: BP 101/52 mmHg  Pulse 80  Temp(Src) 98.7 F (37.1 C) (Oral)  Ht 5\' 2"  (1.575 m)  Wt 110 lb 3.2 oz (49.986 kg)  BMI 20.15 kg/m2  LMP 08/23/2014 Gen: NAD, alert, cooperative with exam HEENT: NCAT CV: RRR, good S1/S2, no murmur Resp: CTABL, no wheezes, non-labored Abd: SNTND, BS present, no guarding or organomegaly Ext: No edema, warm Neuro: Alert and oriented, No gross deficits  Declines pelvic exam today  Assessment and plan:  Pregnancy related nausea, antepartum Normal first trimester pregnancy related nausea Treat with Zofran, encourage fluids With abdominal cramping discussed red flags for return, no vaginal bleeding at this time to indicate miscarriage. Patient declines pelvic exam today Encouraged her to start taking prenatal vitamins      No orders of the defined types were placed in this encounter.    Meds ordered this encounter  Medications  . ondansetron (ZOFRAN) 4 MG tablet    Sig: Take 1 tablet (4 mg total) by mouth every 8 (eight) hours as needed for nausea or vomiting.    Dispense:  30 tablet    Refill:  0

## 2014-10-12 NOTE — Patient Instructions (Signed)
Great to meet you!  You are having normal nausea, the cramping should not persist.  Get plenty of fluids.   If you  Have any vaginal bleeding or worsening of your cramps please seek additional medical help hear or in the MAU.

## 2014-10-12 NOTE — Assessment & Plan Note (Addendum)
Normal first trimester pregnancy related nausea Treat with Zofran, encourage fluids With abdominal cramping discussed red flags for return, no vaginal bleeding at this time to indicate miscarriage. Patient declines pelvic exam today Encouraged her to start taking prenatal vitamins

## 2014-10-13 ENCOUNTER — Other Ambulatory Visit: Payer: Self-pay | Admitting: *Deleted

## 2014-10-13 DIAGNOSIS — Z3490 Encounter for supervision of normal pregnancy, unspecified, unspecified trimester: Secondary | ICD-10-CM

## 2014-10-13 NOTE — Progress Notes (Unsigned)
Pt scheduled for dating and viability ultrasound on 10/19/14.

## 2014-10-19 ENCOUNTER — Other Ambulatory Visit: Payer: Self-pay | Admitting: Obstetrics and Gynecology

## 2014-10-19 ENCOUNTER — Ambulatory Visit (HOSPITAL_COMMUNITY)
Admission: RE | Admit: 2014-10-19 | Discharge: 2014-10-19 | Disposition: A | Discharge status: Home / Self Care | Payer: Medicaid Other | Source: Ambulatory Visit | Attending: Obstetrics and Gynecology | Admitting: Obstetrics and Gynecology

## 2014-10-19 DIAGNOSIS — Z3490 Encounter for supervision of normal pregnancy, unspecified, unspecified trimester: Secondary | ICD-10-CM

## 2014-10-19 DIAGNOSIS — Z3A08 8 weeks gestation of pregnancy: Secondary | ICD-10-CM | POA: Diagnosis not present

## 2014-10-19 DIAGNOSIS — Z36 Encounter for antenatal screening of mother: Secondary | ICD-10-CM | POA: Insufficient documentation

## 2014-10-24 ENCOUNTER — Telehealth: Payer: Self-pay | Admitting: General Practice

## 2014-10-24 ENCOUNTER — Encounter (HOSPITAL_COMMUNITY): Payer: Self-pay | Admitting: *Deleted

## 2014-10-24 ENCOUNTER — Inpatient Hospital Stay (HOSPITAL_COMMUNITY)
Admission: AD | Admit: 2014-10-24 | Discharge: 2014-10-24 | Disposition: A | Discharge status: Home / Self Care | Payer: Medicaid Other | Source: Ambulatory Visit | Attending: Obstetrics & Gynecology | Admitting: Obstetrics & Gynecology

## 2014-10-24 DIAGNOSIS — Z3A09 9 weeks gestation of pregnancy: Secondary | ICD-10-CM | POA: Insufficient documentation

## 2014-10-24 DIAGNOSIS — N898 Other specified noninflammatory disorders of vagina: Secondary | ICD-10-CM | POA: Insufficient documentation

## 2014-10-24 DIAGNOSIS — Z87891 Personal history of nicotine dependence: Secondary | ICD-10-CM | POA: Diagnosis not present

## 2014-10-24 DIAGNOSIS — O9989 Other specified diseases and conditions complicating pregnancy, childbirth and the puerperium: Secondary | ICD-10-CM | POA: Diagnosis not present

## 2014-10-24 DIAGNOSIS — Z5321 Procedure and treatment not carried out due to patient leaving prior to being seen by health care provider: Secondary | ICD-10-CM | POA: Insufficient documentation

## 2014-10-24 LAB — URINALYSIS, ROUTINE W REFLEX MICROSCOPIC
Bilirubin Urine: NEGATIVE
GLUCOSE, UA: 100 mg/dL — AB
Hgb urine dipstick: NEGATIVE
KETONES UR: NEGATIVE mg/dL
Leukocytes, UA: NEGATIVE
Nitrite: NEGATIVE
PROTEIN: NEGATIVE mg/dL
Specific Gravity, Urine: <1.005 — ABNORMAL LOW (ref 1.005–1.030)
Urobilinogen, UA: 0.2 mg/dL (ref 0.0–1.0)
pH: 6.5 (ref 5.0–8.0)

## 2014-10-24 LAB — RAPID URINE DRUG SCREEN, HOSP PERFORMED
Amphetamines: NOT DETECTED
BARBITURATES: NOT DETECTED
Benzodiazepines: NOT DETECTED
Cocaine: NOT DETECTED
Opiates: NOT DETECTED
TETRAHYDROCANNABINOL: NOT DETECTED

## 2014-10-24 NOTE — MAU Note (Signed)
Yesterday felt like she had peed on herself, it was brownish white. Some spotting noted at times when she pees.  Just wanted to make sure baby is all right.

## 2014-10-24 NOTE — MAU Provider Note (Signed)
History     CSN: [id number]  Arrival date and time: 10/24/14 1220   None     Chief Complaint  Patient presents with  . Vaginal Discharge   HPI   Candice White is a 24 y.o. female G1P0 who presents with vaginal discharge. The discharge started yesterday; it was brown/ white.   She is having abdominal cramping here and there. She denies bleeding currently and denies pain currently.   She has had no new sexual partners.  She is scheduled to be seen in the clinic in February and would like an [place] today to check on her baby.   OB History    Gravida Para Term Preterm AB TAB SAB Ectopic Multiple Living   1         0      Past Medical History  Diagnosis Date  . PID (acute pelvic inflammatory disease)   . BV (bacterial vaginosis)   . Migraine     Past Surgical History  Procedure Laterality Date  . No past surgeries      Family History  Problem Relation Age of Onset  . Cancer Maternal Aunt     lung cancer  . Cancer Cousin     lung    History  Substance Use Topics  . Smoking status: Former Smoker -- 3 years    Quit date: 12/08/2012  . Smokeless tobacco: Former Neurosurgeon    Quit date: 10/02/2014  . Alcohol Use: No    Allergies:  Allergies  Allergen Reactions  . Pollen Extract     Prescriptions prior to admission  Medication Sig Dispense Refill Last Dose  . Prenatal Vit-Fe Fumarate-FA (PRENATAL COMPLETE) 14-0.4 MG TABS Take 1 tablet by mouth daily. 90 each 1 10/23/2014 at Unknown time  . Prenatal Vit-Fe Fumarate-FA (PRENATAL MULTIVITAMIN) TABS tablet Take 1 tablet by mouth daily at 12 noon.   10/23/2014 at Unknown time  . ondansetron (ZOFRAN) 4 MG tablet Take 1 tablet (4 mg total) by mouth every 8 (eight) hours as needed for nausea or vomiting. (Patient not taking: Reported on 10/24/2014) 30 tablet 0    Results for orders placed or performed during the hospital encounter of 10/24/14 (from the past 48 hour(s))  Urinalysis, Routine w reflex microscopic     Status:  Abnormal   Collection Time: 10/24/14 12:35 PM  Result Value Ref Range   Color, Urine YELLOW YELLOW   APPearance CLEAR CLEAR   Specific Gravity, Urine <1.005 (L) 1.005 - 1.030   pH 6.5 5.0 - 8.0   Glucose, UA 100 (A) NEGATIVE mg/dL   Hgb urine dipstick NEGATIVE NEGATIVE   Bilirubin Urine NEGATIVE NEGATIVE   Ketones, ur NEGATIVE NEGATIVE mg/dL   Protein, ur NEGATIVE NEGATIVE mg/dL   Urobilinogen, UA 0.2 0.0 - 1.0 mg/dL   Nitrite NEGATIVE NEGATIVE   Leukocytes, UA NEGATIVE NEGATIVE    Comment: MICROSCOPIC NOT DONE ON URINES WITH NEGATIVE PROTEIN, BLOOD, LEUKOCYTES, NITRITE, OR GLUCOSE <1000 mg/dL.    Review of Systems  Constitutional: Negative for fever and chills.  Gastrointestinal: Negative for nausea, vomiting, abdominal pain, diarrhea and constipation.  Genitourinary:       Brown vaginal discharge yesterday.    Physical Exam   Blood pressure 105/59, pulse 91, temperature 97.6 F (36.4 C), temperature source Oral, resp. rate 16, height  (1.6 m), weight 51.166 kg (112 lb 12.8 oz), last menstrual period 08/23/2014.  Physical Exam  Constitutional: She is oriented to person, place, and time. She  appears well-developed and well-nourished. No distress.  Musculoskeletal: Normal range of motion.  Neurological: She is alert and oriented to person, place, and time.  Skin: She is not diaphoretic.    MAU Course  Procedures  None  MDM Patient declined pelvic exam; assessment of vaginal discharge Patient opted to leave AMA   Assessment and Plan   A:  Abnormal discharge in pregnancy Patient left AMA.    Iona HansenJennifer Irene Aspynn Clover, NP 10/24/2014 3:33 PM

## 2014-10-24 NOTE — Telephone Encounter (Signed)
Patient called in to front office stating she was just seen upstairs in MAU for a discharge she was having and they didn't do an ultrasound and she would like to have one to make sure the baby is okay. Patient has new OB appt 2/9. Asked patient if anything had been wrong other than the discharge. Patient states no. Told patient that at her ultrasound on 1/20 it looks like they visualized everything and baby looked good. Told patient that if she has concerns or still has discharge we can look into those things at her appt with us. Patient verbalized understanding and had no questions

## 2014-10-26 ENCOUNTER — Ambulatory Visit (INDEPENDENT_AMBULATORY_CARE_PROVIDER_SITE_OTHER): Payer: Medicaid Other | Admitting: Family Medicine

## 2014-10-26 VITALS — BP 101/67 | HR 77 | Temp 98.1°F | Ht 63.0 in | Wt 112.3 lb

## 2014-10-26 DIAGNOSIS — R195 Other fecal abnormalities: Secondary | ICD-10-CM

## 2014-10-26 NOTE — Patient Instructions (Signed)
Thank you for coming to see me today. It was a pleasure. Today we talked about:   Watery stool: Since this is not recurring, we will not do anything now. If you have any vaginal bleeding or cramps, please see your OB/GYN. I have written a work excuse for you for today. Since you will be [facility] for your prenatal care on 11/01/2014, you can return to us until then.   If you have any questions or concerns, please do not hesitate to call the office at [phone].  Sincerely,  Jacquelin Hawkingalph Aarvi Stotts, MD   First Trimester of Pregnancy The first trimester of pregnancy is from week 1 until the end of week 12 (months 1 through 3). A week after a sperm fertilizes an egg, the egg will implant on the wall of the uterus. This embryo will begin to develop into a baby. Genes from you and your partner are forming the baby. The female genes determine whether the baby is a boy or a girl. At 6-8 weeks, the eyes and face are formed, and the heartbeat can be seen on ultrasound. At the end of 12 weeks, all the baby's organs are formed.  Now that you are pregnant, you will want to do everything you can to have a healthy baby. Two of the most important things are to get good prenatal care and to follow your health care provider's instructions. Prenatal care is all the medical care you receive before the baby's birth. This care will help prevent, find, and treat any problems during the pregnancy and childbirth. BODY CHANGES Your body goes through many changes during pregnancy. The changes vary from woman to woman.   You may gain or lose a couple of pounds at first.  You may feel sick to your stomach (nauseous) and throw up (vomit). If the vomiting is uncontrollable, call your health care provider.  You may tire easily.  You may develop headaches that can be relieved by medicines approved by your health care provider.  You may urinate more often. Painful urination may mean you have a bladder  infection.  You may develop heartburn as a result of your pregnancy.  You may develop constipation because certain hormones are causing the muscles that push waste through your intestines to slow down.  You may develop hemorrhoids or swollen, bulging veins (varicose veins).  Your breasts may begin to grow larger and become tender. Your nipples may stick out more, and the tissue that surrounds them (areola) may become darker.  Your gums may bleed and may be sensitive to brushing and flossing.  Dark spots or blotches (chloasma, mask of pregnancy) may develop on your face. This will likely fade after the baby is born.  Your menstrual periods will stop.  You may have a loss of appetite.  You may develop cravings for certain kinds of food.  You may have changes in your emotions from day to day, such as being excited to be pregnant or being concerned that something may go wrong with the pregnancy and baby.  You may have more vivid and strange dreams.  You may have changes in your hair. These can include thickening of your hair, rapid growth, and changes in texture. Some women also have hair loss during or after pregnancy, or hair that feels dry or thin. Your hair will most likely return to normal after your baby is born. WHAT TO EXPECT AT YOUR PRENATAL VISITS During a routine prenatal visit:  You  will be weighed to make sure you and the baby are growing normally.  Your blood pressure will be taken.  Your abdomen will be measured to track your baby's growth.  The fetal heartbeat will be listened to starting around week 10 or 12 of your pregnancy.  Test results from any previous visits will be discussed. Your health care provider may ask you:  How you are feeling.  If you are feeling the baby move.  If you have had any abnormal symptoms, such as leaking fluid, bleeding, severe headaches, or abdominal cramping.  If you have any questions. Other tests that may be performed during  your first trimester include:  Blood tests to find your blood type and to check for the presence of any previous infections. They will also be used to check for low iron levels (anemia) and Rh antibodies. Later in the pregnancy, blood tests for diabetes will be done along with other tests if problems develop.  Urine tests to check for infections, diabetes, or protein in the urine.  An ultrasound to confirm the proper growth and development of the baby.  An amniocentesis to check for possible genetic problems.  Fetal screens for spina bifida and Down syndrome.  You may need other tests to make sure you and the baby are doing well. HOME CARE INSTRUCTIONS  Medicines  Follow your health care provider's instructions regarding medicine use. Specific medicines may be either safe or unsafe to take during pregnancy.  Take your prenatal vitamins as directed.  If you develop constipation, try taking a stool softener if your health care provider approves. Diet  Eat regular, well-balanced meals. Choose a variety of foods, such as meat or vegetable-based protein, fish, milk and low-fat dairy products, vegetables, fruits, and whole grain breads and cereals. Your health care provider will help you determine the amount of weight gain that is right for you.  Avoid raw meat and uncooked cheese. These carry germs that can cause birth defects in the baby.  Eating four or five small meals rather than three large meals a day may help relieve nausea and vomiting. If you start to feel nauseous, eating a few soda crackers can be helpful. Drinking liquids between meals instead of during meals also seems to help nausea and vomiting.  If you develop constipation, eat more high-fiber foods, such as fresh vegetables or fruit and whole grains. Drink enough fluids to keep your urine clear or pale yellow. Activity and Exercise  Exercise only as directed by your health care provider. Exercising will help  you:  Control your weight.  Stay in shape.  Be prepared for labor and delivery.  Experiencing pain or cramping in the lower abdomen or low back is a good sign that you should stop exercising. Check with your health care provider before continuing normal exercises.  Try to avoid standing for long periods of time. Move your legs often if you must stand in one place for a long time.  Avoid heavy lifting.  Wear low-heeled shoes, and practice good posture.  You may continue to have sex unless your health care provider directs you otherwise. Relief of Pain or Discomfort  Wear a good support bra for breast tenderness.   Take warm sitz baths to soothe any pain or discomfort caused by hemorrhoids. Use hemorrhoid cream if your health care provider approves.   Rest with your legs elevated if you have leg cramps or low back pain.  If you develop varicose veins in your legs, wear  support hose. Elevate your feet for 15 minutes, 3-4 times a day. Limit salt in your diet. Prenatal Care  Schedule your prenatal visits by the twelfth week of pregnancy. They are usually scheduled monthly at first, then more often in the last 2 months before delivery.  Write down your questions. Take them to your prenatal visits.  Keep all your prenatal visits as directed by your health care provider. Safety  Wear your seat belt at all times when driving.  Make a list of emergency phone numbers, including numbers for family, friends, the hospital, and police and fire departments. General Tips  Ask your health care provider for a referral to a local prenatal education class. Begin classes no later than at the beginning of month 6 of your pregnancy.  Ask for help if you have counseling or nutritional needs during pregnancy. Your health care provider can offer advice or refer you to specialists for help with various needs.  Do not use hot tubs, steam rooms, or saunas.  Do not douche or use tampons or scented  sanitary pads.  Do not cross your legs for long periods of time.  Avoid cat litter boxes and soil used by cats. These carry germs that can cause birth defects in the baby and possibly loss of the fetus by miscarriage or stillbirth.  Avoid all smoking, herbs, alcohol, and medicines not prescribed by your health care provider. Chemicals in these affect the formation and growth of the baby.  Schedule a dentist appointment. At home, brush your teeth with a soft toothbrush and be gentle when you floss. SEEK MEDICAL CARE IF:   You have dizziness.  You have mild pelvic cramps, pelvic pressure, or nagging pain in the abdominal area.  You have persistent nausea, vomiting, or diarrhea.  You have a bad smelling vaginal discharge.  You have pain with urination.  You notice increased swelling in your face, hands, legs, or ankles. SEEK IMMEDIATE MEDICAL CARE IF:   You have a fever.  You are leaking fluid from your vagina.  You have spotting or bleeding from your vagina.  You have severe abdominal cramping or pain.  You have rapid weight gain or loss.  You vomit blood or material that looks like coffee grounds.  You are exposed to [place] measles and have never had them.  You are exposed to fifth disease or chickenpox.  You develop a severe headache.  You have shortness of breath.  You have any kind of trauma, such as from a fall or a car accident. Document Released: 09/10/2001 Document Revised: 01/31/2014 Document Reviewed: 07/27/2013 [facility] Patient Information 2015 [place], [place]. This information is not intended to replace advice given to you by your health care provider. Make sure you discuss any questions you have with your health care provider.

## 2014-10-26 NOTE — Progress Notes (Signed)
    Subjective   Candice White is a G1P0 2644w1d 24 y.o. female that presents for a same day visit  1. Episode of watery stool:  Episode occurred this morning. She woke up and had an episode of watery stool followed by formed stool. She has had some nausea with minimal emesis since yesterday (two episodes total), which she attributes to when she takes her prenatal vitamins. No fevers, although endorses some chills at night. She is requesting a work excuse. She is starting prenatal care with [facility] starting on 11/01/2014. She endorses some brown vaginal discharge which has been present since she was pregnant and has been evaluated (she is refusing a pelvic exam). No vaginal bleeding or abdominal cramping.  History  Substance Use Topics  . Smoking status: Former Smoker -- 3 years    Quit date: 12/08/2012  . Smokeless tobacco: Former NeurosurgeonUser    Quit date: 10/02/2014  . Alcohol Use: No    ROS Per HPI  Objective   BP 101/67 mmHg  Pulse 77  Temp(Src) 98.1 F (36.7 C) (Oral)  Ht 5\' 3"  (1.6 m)  Wt 112 lb 4.8 oz (50.939 kg)  BMI 19.90 kg/m2  LMP 08/23/2014  General: Well appearing Gastrointestinal: Soft, non-tender, non-distended, no guarding or rebound  Assessment and Plan   Watery stool: one episode that eventually resolved during bowel movement  No management needed unless symptoms continue  Follow-up with OB/Gyn after 11/01/2014; until then, discussed return precautions to our clinic  Work excuse written  Advised to eat with prenatal vitamins to see if nausea improves

## 2014-11-01 LAB — OB RESULTS CONSOLE HIV ANTIBODY (ROUTINE TESTING): HIV: NONREACTIVE

## 2014-11-01 LAB — OB RESULTS CONSOLE GC/CHLAMYDIA
CHLAMYDIA, DNA PROBE: NEGATIVE
GC PROBE AMP, GENITAL: NEGATIVE

## 2014-11-01 LAB — OB RESULTS CONSOLE ABO/RH: RH Type: POSITIVE

## 2014-11-01 LAB — OB RESULTS CONSOLE RPR: RPR: NONREACTIVE

## 2014-11-01 LAB — OB RESULTS CONSOLE ANTIBODY SCREEN: Antibody Screen: NEGATIVE

## 2014-11-01 LAB — OB RESULTS CONSOLE HEPATITIS B SURFACE ANTIGEN: Hepatitis B Surface Ag: NEGATIVE

## 2014-11-01 LAB — OB RESULTS CONSOLE RUBELLA ANTIBODY, IGM: Rubella: IMMUNE

## 2014-11-08 ENCOUNTER — Encounter: Payer: Self-pay | Admitting: Physician Assistant

## 2014-11-12 ENCOUNTER — Inpatient Hospital Stay (HOSPITAL_COMMUNITY)
Admission: AD | Admit: 2014-11-12 | Discharge: 2014-11-12 | Disposition: A | Discharge status: Home / Self Care | Payer: Medicaid Other | Source: Ambulatory Visit | Attending: Obstetrics and Gynecology | Admitting: Obstetrics and Gynecology

## 2014-11-12 ENCOUNTER — Encounter (HOSPITAL_COMMUNITY): Payer: Self-pay

## 2014-11-12 DIAGNOSIS — O9989 Other specified diseases and conditions complicating pregnancy, childbirth and the puerperium: Secondary | ICD-10-CM | POA: Diagnosis not present

## 2014-11-12 DIAGNOSIS — Z87891 Personal history of nicotine dependence: Secondary | ICD-10-CM | POA: Diagnosis not present

## 2014-11-12 DIAGNOSIS — R079 Chest pain, unspecified: Secondary | ICD-10-CM | POA: Insufficient documentation

## 2014-11-12 DIAGNOSIS — Z3A11 11 weeks gestation of pregnancy: Secondary | ICD-10-CM | POA: Diagnosis not present

## 2014-11-12 DIAGNOSIS — N949 Unspecified condition associated with female genital organs and menstrual cycle: Secondary | ICD-10-CM | POA: Diagnosis not present

## 2014-11-12 DIAGNOSIS — F439 Reaction to severe stress, unspecified: Secondary | ICD-10-CM | POA: Diagnosis present

## 2014-11-12 LAB — URINALYSIS, ROUTINE W REFLEX MICROSCOPIC
BILIRUBIN URINE: NEGATIVE
GLUCOSE, UA: NEGATIVE mg/dL
Hgb urine dipstick: NEGATIVE
Ketones, ur: 15 mg/dL — AB
Leukocytes, UA: NEGATIVE
NITRITE: NEGATIVE
Protein, ur: NEGATIVE mg/dL
SPECIFIC GRAVITY, URINE: 1.025 (ref 1.005–1.030)
Urobilinogen, UA: 0.2 mg/dL (ref 0.0–1.0)
pH: 6 (ref 5.0–8.0)
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
## 2014-11-21 ENCOUNTER — Encounter (HOSPITAL_COMMUNITY): Payer: Self-pay | Admitting: *Deleted

## 2014-11-21 ENCOUNTER — Inpatient Hospital Stay (HOSPITAL_COMMUNITY)
Admission: AD | Admit: 2014-11-21 | Discharge: 2014-11-21 | Disposition: A | Discharge status: Home / Self Care | Payer: Medicaid Other | Source: Ambulatory Visit | Attending: Obstetrics and Gynecology | Admitting: Obstetrics and Gynecology

## 2014-11-21 DIAGNOSIS — Z3A13 13 weeks gestation of pregnancy: Secondary | ICD-10-CM | POA: Insufficient documentation

## 2014-11-21 DIAGNOSIS — R1032 Left lower quadrant pain: Secondary | ICD-10-CM | POA: Diagnosis present

## 2014-11-21 DIAGNOSIS — R1031 Right lower quadrant pain: Secondary | ICD-10-CM

## 2014-11-21 DIAGNOSIS — R200 Anesthesia of skin: Secondary | ICD-10-CM | POA: Insufficient documentation

## 2014-11-21 DIAGNOSIS — O9989 Other specified diseases and conditions complicating pregnancy, childbirth and the puerperium: Secondary | ICD-10-CM | POA: Diagnosis not present

## 2014-11-21 LAB — URINALYSIS, ROUTINE W REFLEX MICROSCOPIC
Bilirubin Urine: NEGATIVE
Glucose, UA: NEGATIVE mg/dL
HGB URINE DIPSTICK: NEGATIVE
Ketones, ur: 15 mg/dL — AB
Leukocytes, UA: NEGATIVE
Nitrite: NEGATIVE
PROTEIN: NEGATIVE mg/dL
Specific Gravity, Urine: 1.025 (ref 1.005–1.030)
Urobilinogen, UA: 0.2 mg/dL (ref 0.0–1.0)
pH: 6 (ref 5.0–8.0)

## 2014-11-21 LAB — URINE MICROSCOPIC-ADD ON

## 2014-11-21 MED ORDER — ACETAMINOPHEN 500 MG PO TABS
1000.0000 mg | ORAL_TABLET | Freq: Once | ORAL | Status: AC
  Administered 2014-11-21: 1000 mg via ORAL
  Filled 2014-11-21: qty 2

## 2014-11-21 MED ORDER — ACETAMINOPHEN 325 MG PO TABS: 650.0000 mg | ORAL_TABLET | Freq: Four times a day (QID) | ORAL | Status: DC | PRN

## 2014-11-21 NOTE — MAU Note (Signed)
LLQ pain, cramping, feels numb @ times, L side of head is hurting.  States urine is yellow, denies dysuria, has odor @ times.  Denies bleeding.

## 2014-11-21 NOTE — MAU Note (Signed)
Pt states that she has been having some left sided numbness and pain which radiates down into the groin and upper thigh. Pt states that she is not bleeding nor having leaking of fluid.

## 2014-11-21 NOTE — MAU Note (Signed)
Thornell MuleK Williams CNM to come and see patient when finished in on Antenatal unit.

## 2014-11-21 NOTE — MAU Note (Signed)
Urine in lab 

## 2014-11-21 NOTE — Discharge Instructions (Signed)
Abdominal Pain During Pregnancy °Belly (abdominal) pain is common during pregnancy. Most of the time, it is not a serious problem. Other times, it can be a sign that something is wrong with the pregnancy. Always tell your doctor if you have belly pain. °HOME CARE °Monitor your belly pain for any changes. The following actions may help you feel better: °· Do not have sex (intercourse) or put anything in your vagina until you feel better. °· Rest until your pain stops. °· Drink clear fluids if you feel sick to your stomach (nauseous). Do not eat solid food until you feel better. °· Only take medicine as told by your doctor. °· Keep all doctor visits as told. °GET HELP RIGHT AWAY IF:  °· You are bleeding, leaking fluid, or pieces of tissue come out of your vagina. °· You have more pain or cramping. °· You keep throwing up (vomiting). °· You have pain when you pee (urinate) or have blood in your pee. °· You have a fever. °· You do not feel your baby moving as much. °· You feel very weak or feel like passing out. °· You have trouble breathing, with or without belly pain. °· You have a very bad headache and belly pain. °· You have fluid leaking from your vagina and belly pain. °· You keep having watery poop (diarrhea). °· Your belly pain does not go away after resting, or the pain gets worse. °MAKE SURE YOU:  °· Understand these instructions. °· Will watch your condition. °· Will get help right away if you are not doing well or get worse. °Document Released: 09/04/2009 Document Revised: 05/19/2013 Document Reviewed: 04/15/2013 °ExitCare® Patient Information ©2015 [employer]. This information is not intended to replace advice given to you by your health care provider. Make sure you discuss any questions you have with your health care provider. ° °

## 2014-11-22 LAB — CULTURE, OB URINE: Colony Count: >=100000

## 2014-11-22 NOTE — MAU Provider Note (Signed)
History  24 yo G1P0 @ 12.6 wks presents to MAU unannounced w/ c/o acute onset of left sided numbness and pain radiating down into her groin and upper thigh. No treatments tried. Denies VB, unusual vaginal discharge/LOF or N/V.   Patient Active Problem List   Diagnosis Date Noted  . Abdominal pain, left lower quadrant 11/21/2014  . Chest pain due to psychological stress 11/12/2014  . Pregnancy related nausea, antepartum 10/12/2014  . Abnormal uterine bleeding 12/13/2013  . Vaginal spotting 08/05/2013  . Cellulitis and abscess of buttock 05/13/2013  . Discharge of vagina 05/13/2013  . Syncope 01/06/2013  . Sleep disturbance 10/19/2012  . Contraception management 10/19/2012  . Family planning 03/11/2012    Chief Complaint  Patient presents with  . Abdominal Pain  . Headache   HPI As above OB History    Gravida Para Term Preterm AB TAB SAB Ectopic Multiple Living   1         0      Past Medical History  Diagnosis Date  . PID (acute pelvic inflammatory disease)   . BV (bacterial vaginosis)   . Migraine     Past Surgical History  Procedure Laterality Date  . No past surgeries      Family History  Problem Relation Age of Onset  . Cancer Maternal Aunt     lung cancer  . Cancer Cousin     lung  . Hypertension Mother     History  Substance Use Topics  . Smoking status: Former Smoker -- 3 years    Quit date: 12/08/2012  . Smokeless tobacco: Former NeurosurgeonUser    Quit date: 10/02/2014  . Alcohol Use: No     Comment: in past    Allergies:  Allergies  Allergen Reactions  . Pollen Extract Itching and Other (See Comments)    Reaction:  Sneezing     No prescriptions prior to admission    ROS  LLQ pain Physical Exam    Blood pressure 95/67, pulse 78, temperature 98.9 F (37.2 C), temperature source Oral, resp. rate 18, height 5\' 3"  (1.6 m), weight 107 lb 6.4 oz (48.716 kg), last menstrual period 08/23/2014.    Physical Exam Gen: NAD Abdomen: soft, NT, no  guarding or rebound tenderness Pelvic: Deferred Doptones: 160s ED Course  Tylenol UA Urine culture  Assessment: Likely round ligament pain. Pain relieved w/ Tylenol.  Plan: Reviewed common discomforts of pregnancy along w/ comfort measures. Hydrate. Await urine culture results. OB f/u prn.   Sherre ScarletWILLIAMS, Leanor Voris CNM, MS 11/21/2014, 08:30 PM

## 2014-12-04 ENCOUNTER — Inpatient Hospital Stay (HOSPITAL_COMMUNITY)
Admission: AD | Admit: 2014-12-04 | Discharge: 2014-12-04 | Discharge status: Against Medical Advice | Payer: Medicaid Other | Source: Ambulatory Visit | Attending: Obstetrics and Gynecology | Admitting: Obstetrics and Gynecology

## 2014-12-04 ENCOUNTER — Encounter (HOSPITAL_COMMUNITY): Payer: Self-pay | Admitting: *Deleted

## 2014-12-04 DIAGNOSIS — Z3A14 14 weeks gestation of pregnancy: Secondary | ICD-10-CM | POA: Diagnosis not present

## 2014-12-04 DIAGNOSIS — O9989 Other specified diseases and conditions complicating pregnancy, childbirth and the puerperium: Secondary | ICD-10-CM | POA: Diagnosis not present

## 2014-12-04 DIAGNOSIS — R109 Unspecified abdominal pain: Secondary | ICD-10-CM | POA: Diagnosis present

## 2014-12-04 LAB — URINALYSIS, ROUTINE W REFLEX MICROSCOPIC
BILIRUBIN URINE: NEGATIVE
Glucose, UA: NEGATIVE mg/dL
Hgb urine dipstick: NEGATIVE
Ketones, ur: NEGATIVE mg/dL
Leukocytes, UA: NEGATIVE
NITRITE: NEGATIVE
PROTEIN: NEGATIVE mg/dL
Specific Gravity, Urine: <1.005 — ABNORMAL LOW (ref 1.005–1.030)
Urobilinogen, UA: 0.2 mg/dL (ref 0.0–1.0)
pH: 7 (ref 5.0–8.0)

## 2014-12-04 NOTE — MAU Note (Signed)
Pt said she was going to her car to get her phone charger and never came back.

## 2014-12-04 NOTE — MAU Note (Signed)
Pt presents to MAU with complaints of pain in her lower abdomen that started last night. Denies any vaginal bleeding or LOF

## 2014-12-13 ENCOUNTER — Inpatient Hospital Stay (HOSPITAL_COMMUNITY)
Admission: AD | Admit: 2014-12-13 | Discharge: 2014-12-13 | Discharge status: Against Medical Advice | Payer: Medicaid Other | Source: Ambulatory Visit | Attending: Obstetrics & Gynecology | Admitting: Obstetrics & Gynecology

## 2014-12-13 DIAGNOSIS — O9989 Other specified diseases and conditions complicating pregnancy, childbirth and the puerperium: Secondary | ICD-10-CM | POA: Diagnosis not present

## 2014-12-13 DIAGNOSIS — R109 Unspecified abdominal pain: Secondary | ICD-10-CM | POA: Insufficient documentation

## 2014-12-13 DIAGNOSIS — Z5321 Procedure and treatment not carried out due to patient leaving prior to being seen by health care provider: Secondary | ICD-10-CM | POA: Diagnosis not present

## 2014-12-13 DIAGNOSIS — Z3A16 16 weeks gestation of pregnancy: Secondary | ICD-10-CM | POA: Diagnosis not present

## 2014-12-13 LAB — URINALYSIS, ROUTINE W REFLEX MICROSCOPIC
Bilirubin Urine: NEGATIVE
GLUCOSE, UA: NEGATIVE mg/dL
Hgb urine dipstick: NEGATIVE
KETONES UR: NEGATIVE mg/dL
Leukocytes, UA: NEGATIVE
NITRITE: NEGATIVE
Protein, ur: NEGATIVE mg/dL
SPECIFIC GRAVITY, URINE: 1.02 (ref 1.005–1.030)
Urobilinogen, UA: 0.2 mg/dL (ref 0.0–1.0)
pH: 8 (ref 5.0–8.0)

## 2014-12-13 NOTE — MAU Note (Signed)
Called but not in lobby 

## 2014-12-13 NOTE — MAU Note (Signed)
Pt C/o sharp upper abd pain that happened last night, denies any abd pain right now.  Denies bleeding or discharge.

## 2014-12-13 NOTE — MAU Note (Signed)
Called to come back to room but not in lobby

## 2014-12-14 ENCOUNTER — Encounter (HOSPITAL_COMMUNITY): Payer: Self-pay | Admitting: *Deleted

## 2014-12-14 ENCOUNTER — Inpatient Hospital Stay (HOSPITAL_COMMUNITY)
Admission: AD | Admit: 2014-12-14 | Discharge: 2014-12-14 | Disposition: A | Discharge status: Home / Self Care | Payer: Medicaid Other | Source: Ambulatory Visit | Attending: Obstetrics and Gynecology | Admitting: Obstetrics and Gynecology

## 2014-12-14 DIAGNOSIS — O9989 Other specified diseases and conditions complicating pregnancy, childbirth and the puerperium: Secondary | ICD-10-CM | POA: Diagnosis not present

## 2014-12-14 DIAGNOSIS — Z3A16 16 weeks gestation of pregnancy: Secondary | ICD-10-CM | POA: Diagnosis not present

## 2014-12-14 DIAGNOSIS — R109 Unspecified abdominal pain: Secondary | ICD-10-CM | POA: Insufficient documentation

## 2014-12-14 DIAGNOSIS — O26899 Other specified pregnancy related conditions, unspecified trimester: Secondary | ICD-10-CM

## 2014-12-14 DIAGNOSIS — Z87891 Personal history of nicotine dependence: Secondary | ICD-10-CM | POA: Insufficient documentation

## 2014-12-14 LAB — WET PREP, GENITAL
Clue Cells Wet Prep HPF POC: NONE SEEN
TRICH WET PREP: NONE SEEN
Yeast Wet Prep HPF POC: NONE SEEN

## 2014-12-14 NOTE — Discharge Instructions (Signed)
Call for any questions or concerns. Try to keep bladder a little more empty during the night.

## 2014-12-14 NOTE — MAU Note (Signed)
Pt presents to MAU with complaints of cramping in her lower abdomen since for 3 days. States she came in yesterday to be evaluated but had to leave because of work. Denies any vaginal bleeding.

## 2014-12-15 LAB — GC/CHLAMYDIA PROBE AMP (~~LOC~~) NOT AT ARMC
CHLAMYDIA, DNA PROBE: NEGATIVE
NEISSERIA GONORRHEA: NEGATIVE

## 2014-12-18 NOTE — MAU Provider Note (Signed)
History   24 yo G1P0 at 36 1/7 weeks presented unannounced c/o mild cramping, particularly when awakening in the am with a full bladder. Sx resolve after voiding. Denies bleeding or dysuria. She presented to MAU yesterday for evaluation, but left after triaged due to work schedule.  No current pain or any issue.  Some nausea in early am--goes to work at 4am.  UA negative 12/13/14.  Patient Active Problem List   Diagnosis Date Noted  . Cramping affecting pregnancy, antepartum 12/14/2014  . Abdominal pain, left lower quadrant 11/21/2014  . Chest pain due to psychological stress 11/12/2014  . Pregnancy related nausea, antepartum 10/12/2014  . Cellulitis and abscess of buttock 05/13/2013    Chief Complaint  Patient presents with  . Abdominal Cramping   HPI:  See above  OB History    Gravida Para Term Preterm AB TAB SAB Ectopic Multiple Living   1         0      Past Medical History  Diagnosis Date  . PID (acute pelvic inflammatory disease)   . BV (bacterial vaginosis)   . Migraine     Past Surgical History  Procedure Laterality Date  . No past surgeries      Family History  Problem Relation Age of Onset  . Cancer Maternal Aunt     lung cancer  . Cancer Cousin     lung  . Hypertension Mother     History  Substance Use Topics  . Smoking status: Former Smoker -- 3 years    Quit date: 12/08/2012  . Smokeless tobacco: Former Neurosurgeon    Quit date: 10/02/2014  . Alcohol Use: No     Comment: in past    Allergies:  Allergies  Allergen Reactions  . Pollen Extract Itching and Other (See Comments)    Reaction:  Sneezing     No prescriptions prior to admission    ROS:  Occasional cramping in am, none now. Physical Exam   Blood pressure 101/62, pulse 86, temperature 98.4 F (36.9 C), temperature source Oral, resp. rate 16, last menstrual period 08/23/2014.    Physical Exam  Chest clear Heart RRR without murmur Abd gravid, NT Pelvic--small amount white d/c,  cervix closed, long, NT Ext WNL  FHR 145   GC/chlamydia done  Recent Results (from the past 2160 hour(s))  Urinalysis, Routine w reflex microscopic     Status: Abnormal   Collection Time: 10/03/14  2:15 PM  Result Value Ref Range   Color, Urine YELLOW YELLOW   APPearance CLEAR CLEAR   Specific Gravity, Urine 1.010 1.005 - 1.030   pH 5.5 5.0 - 8.0   Glucose, UA NEGATIVE NEGATIVE mg/dL   Hgb urine dipstick TRACE (A) NEGATIVE   Bilirubin Urine NEGATIVE NEGATIVE   Ketones, ur NEGATIVE NEGATIVE mg/dL   Protein, ur NEGATIVE NEGATIVE mg/dL   Urobilinogen, UA 0.2 0.0 - 1.0 mg/dL   Nitrite NEGATIVE NEGATIVE   Leukocytes, UA NEGATIVE NEGATIVE  Urine microscopic-add on     Status: Abnormal   Collection Time: 10/03/14  2:15 PM  Result Value Ref Range   Squamous Epithelial / LPF FEW (A) RARE  Pregnancy, urine POC     Status: Abnormal   Collection Time: 10/03/14  2:27 PM  Result Value Ref Range   Preg Test, Ur POSITIVE (A) NEGATIVE    Comment:        THE SENSITIVITY OF THIS METHODOLOGY IS >24 mIU/mL   Urinalysis, Routine w reflex microscopic  Status: Abnormal   Collection Time: 10/24/14 12:35 PM  Result Value Ref Range   Color, Urine YELLOW YELLOW   APPearance CLEAR CLEAR   Specific Gravity, Urine <1.005 (L) 1.005 - 1.030   pH 6.5 5.0 - 8.0   Glucose, UA 100 (A) NEGATIVE mg/dL   Hgb urine dipstick NEGATIVE NEGATIVE   Bilirubin Urine NEGATIVE NEGATIVE   Ketones, ur NEGATIVE NEGATIVE mg/dL   Protein, ur NEGATIVE NEGATIVE mg/dL   Urobilinogen, UA 0.2 0.0 - 1.0 mg/dL   Nitrite NEGATIVE NEGATIVE   Leukocytes, UA NEGATIVE NEGATIVE    Comment: MICROSCOPIC NOT DONE ON URINES WITH NEGATIVE PROTEIN, BLOOD, LEUKOCYTES, NITRITE, OR GLUCOSE <1000 mg/dL.  Urine rapid drug screen (hosp performed)     Status: None   Collection Time: 10/24/14 12:35 PM  Result Value Ref Range   Opiates NONE DETECTED NONE DETECTED   Cocaine NONE DETECTED NONE DETECTED   Benzodiazepines NONE DETECTED NONE  DETECTED   Amphetamines NONE DETECTED NONE DETECTED   Tetrahydrocannabinol NONE DETECTED NONE DETECTED   Barbiturates NONE DETECTED NONE DETECTED    Comment:        DRUG SCREEN FOR MEDICAL PURPOSES ONLY.  IF CONFIRMATION IS NEEDED FOR ANY PURPOSE, NOTIFY LAB WITHIN 5 DAYS.        LOWEST DETECTABLE LIMITS FOR URINE DRUG SCREEN Drug Class       Cutoff (ng/mL) Amphetamine      1000 Barbiturate      200 Benzodiazepine   200 Tricyclics       300 Opiates          300 Cocaine          300 THC              50 Performed at [facility]   Urinalysis, Routine w reflex microscopic     Status: Abnormal   Collection Time: 11/12/14  8:15 PM  Result Value Ref Range   Color, Urine YELLOW YELLOW   APPearance CLEAR CLEAR   Specific Gravity, Urine 1.025 1.005 - 1.030   pH 6.0 5.0 - 8.0   Glucose, UA NEGATIVE NEGATIVE mg/dL   Hgb urine dipstick NEGATIVE NEGATIVE   Bilirubin Urine NEGATIVE NEGATIVE   Ketones, ur 15 (A) NEGATIVE mg/dL   Protein, ur NEGATIVE NEGATIVE mg/dL   Urobilinogen, UA 0.2 0.0 - 1.0 mg/dL   Nitrite NEGATIVE NEGATIVE   Leukocytes, UA NEGATIVE NEGATIVE    Comment: MICROSCOPIC NOT DONE ON URINES WITH NEGATIVE PROTEIN, BLOOD, LEUKOCYTES, NITRITE, OR GLUCOSE <1000 mg/dL.  Urinalysis, Routine w reflex microscopic     Status: Abnormal   Collection Time: 11/21/14  6:12 PM  Result Value Ref Range   Color, Urine YELLOW YELLOW   APPearance TURBID (A) CLEAR   Specific Gravity, Urine 1.025 1.005 - 1.030   pH 6.0 5.0 - 8.0   Glucose, UA NEGATIVE NEGATIVE mg/dL   Hgb urine dipstick NEGATIVE NEGATIVE   Bilirubin Urine NEGATIVE NEGATIVE   Ketones, ur 15 (A) NEGATIVE mg/dL   Protein, ur NEGATIVE NEGATIVE mg/dL   Urobilinogen, UA 0.2 0.0 - 1.0 mg/dL   Nitrite NEGATIVE NEGATIVE   Leukocytes, UA NEGATIVE NEGATIVE  Urine microscopic-add on     Status: Abnormal   Collection Time: 11/21/14  6:12 PM  Result Value Ref Range   Squamous Epithelial / LPF FEW (A) RARE   WBC, UA 0-2  <3 WBC/hpf   Bacteria, UA MANY (A) RARE   Urine-Other MUCOUS PRESENT   Culture, OB Urine  Status: None   Collection Time: 11/21/14  6:12 PM  Result Value Ref Range   Specimen Description URINE, CLEAN CATCH    Special Requests NONE    Colony Count      >=100,000 COLONIES/ML Performed at [facility]    Culture      VIRIDANS STREPTOCOCCUS Note: NO GROUP B STREP (S.AGALACTIAE) ISOLATED                                                              Culture based screening of vaginal/anorectal swabs at  35 to [redacted] weeks gestation is required to rule out the carriage of Group B Streptococcus. Performed at [facility]    Report Status 11/22/2014 FINAL   Urinalysis, Routine w reflex microscopic     Status: Abnormal   Collection Time: 12/04/14 12:45 PM  Result Value Ref Range   Color, Urine YELLOW YELLOW   APPearance CLEAR CLEAR   Specific Gravity, Urine <1.005 (L) 1.005 - 1.030   pH 7.0 5.0 - 8.0   Glucose, UA NEGATIVE NEGATIVE mg/dL   Hgb urine dipstick NEGATIVE NEGATIVE   Bilirubin Urine NEGATIVE NEGATIVE   Ketones, ur NEGATIVE NEGATIVE mg/dL   Protein, ur NEGATIVE NEGATIVE mg/dL   Urobilinogen, UA 0.2 0.0 - 1.0 mg/dL   Nitrite NEGATIVE NEGATIVE   Leukocytes, UA NEGATIVE NEGATIVE    Comment: MICROSCOPIC NOT DONE ON URINES WITH NEGATIVE PROTEIN, BLOOD, LEUKOCYTES, NITRITE, OR GLUCOSE <1000 mg/dL.  Urinalysis, Routine w reflex microscopic     Status: None   Collection Time: 12/13/14  1:48 PM  Result Value Ref Range   Color, Urine YELLOW YELLOW   APPearance CLEAR CLEAR   Specific Gravity, Urine 1.020 1.005 - 1.030   pH 8.0 5.0 - 8.0   Glucose, UA NEGATIVE NEGATIVE mg/dL   Hgb urine dipstick NEGATIVE NEGATIVE   Bilirubin Urine NEGATIVE NEGATIVE   Ketones, ur NEGATIVE NEGATIVE mg/dL   Protein, ur NEGATIVE NEGATIVE mg/dL   Urobilinogen, UA 0.2 0.0 - 1.0 mg/dL   Nitrite NEGATIVE NEGATIVE   Leukocytes, UA NEGATIVE NEGATIVE    Comment: MICROSCOPIC NOT DONE ON URINES  WITH NEGATIVE PROTEIN, BLOOD, LEUKOCYTES, NITRITE, OR GLUCOSE <1000 mg/dL.  GC/Chlamydia probe amp ([place])     Status: None   Collection Time: 12/14/14 12:00 AM  Result Value Ref Range   Chlamydia CT: Negative     Comment: Normal Reference Range - Negative   Neisseria gonorrhea NG: Negative     Comment: Normal Reference Range - Negative  Wet prep, genital     Status: Abnormal   Collection Time: 12/14/14  5:19 PM  Result Value Ref Range   Yeast Wet Prep HPF POC NONE SEEN NONE SEEN   Trich, Wet Prep NONE SEEN NONE SEEN   Clue Cells Wet Prep HPF POC NONE SEEN NONE SEEN   WBC, Wet Prep HPF POC MODERATE (A) NONE SEEN    Comment: BACTERIA- TOO NUMEROUS TO COUNT    ED Course  Assessment: IUP at 16 2/7/weeks Likely pressure from bladder fullness in am  Plan: Reassured patient regarding normalcy of status--encouraged not to hold urine. Note for patient to request change in work hours to later in the day. Keep scheduled visit at [facility], call prn.   Nigel Bridgeman CNM, MSN 12/14/14  5p

## 2014-12-24 ENCOUNTER — Encounter (HOSPITAL_COMMUNITY): Payer: Self-pay

## 2014-12-24 ENCOUNTER — Inpatient Hospital Stay (HOSPITAL_COMMUNITY)
Admission: AD | Admit: 2014-12-24 | Discharge: 2014-12-24 | Disposition: A | Discharge status: Home / Self Care | Payer: Medicaid Other | Source: Ambulatory Visit | Attending: Obstetrics and Gynecology | Admitting: Obstetrics and Gynecology

## 2014-12-24 DIAGNOSIS — R51 Headache: Secondary | ICD-10-CM | POA: Insufficient documentation

## 2014-12-24 DIAGNOSIS — Z3A17 17 weeks gestation of pregnancy: Secondary | ICD-10-CM | POA: Diagnosis not present

## 2014-12-24 DIAGNOSIS — M549 Dorsalgia, unspecified: Secondary | ICD-10-CM | POA: Diagnosis not present

## 2014-12-24 DIAGNOSIS — Z87891 Personal history of nicotine dependence: Secondary | ICD-10-CM | POA: Insufficient documentation

## 2014-12-24 DIAGNOSIS — O9989 Other specified diseases and conditions complicating pregnancy, childbirth and the puerperium: Secondary | ICD-10-CM | POA: Diagnosis not present

## 2014-12-24 MED ORDER — IBUPROFEN 600 MG PO TABS: 600.0000 mg | ORAL_TABLET | Freq: Four times a day (QID) | ORAL | Status: DC | PRN

## 2014-12-24 NOTE — MAU Note (Signed)
Pt denies any pain at this time.

## 2014-12-24 NOTE — MAU Provider Note (Signed)
History   24 yo G1P0 at 4917 4/7 weeks presented unannounced at 2:46p reporting hx of HA last night and sharp pain between shoulder blades yesterday, but no pain now.  Denies any other sx, and reports she needs to leave at 3:15p.  No N/V, fever, dysuria, cramping, or visual sx.  Reports HA was on left side, and just keep her from sleeping well.  Reports she is here to get Rx for HAs--when advised we could have discussed this on the phone, she advised that her boyfriend made her come.  Patient Active Problem List   Diagnosis Date Noted  . Cramping affecting pregnancy, antepartum 12/14/2014  . Abdominal pain, left lower quadrant 11/21/2014  . Chest pain due to psychological stress 11/12/2014  . Pregnancy related nausea, antepartum 10/12/2014  . Cellulitis and abscess of buttock 05/13/2013    Chief Complaint  Patient presents with  . Back Pain  . Headache   HPI: See above  OB History    Gravida Para Term Preterm AB TAB SAB Ectopic Multiple Living   1         0      Past Medical History  Diagnosis Date  . PID (acute pelvic inflammatory disease)   . BV (bacterial vaginosis)   . Migraine     Past Surgical History  Procedure Laterality Date  . No past surgeries      Family History  Problem Relation Age of Onset  . Cancer Maternal Aunt     lung cancer  . Cancer Cousin     lung  . Hypertension Mother     History  Substance Use Topics  . Smoking status: Former Smoker -- 3 years    Quit date: 12/08/2012  . Smokeless tobacco: Former NeurosurgeonUser    Quit date: 10/02/2014  . Alcohol Use: No     Comment: in past    Allergies:  Allergies  Allergen Reactions  . Pollen Extract Itching and Other (See Comments)    Reaction:  Sneezing     No prescriptions prior to admission    ROS:  No issues at present. Physical Exam   Blood pressure 114/68, pulse 88, last menstrual period 08/23/2014.  Physical Exam  In NAD Full ROM and strength in all extremities, normal gait Chest  clear Heart RRR without murmur Abd gravid, 18 week fundal height, NT Pelvic--deferred Back negative CVAT Ext WNL  FHR 140 by doppler    ED Course  Assessment: IUP at 17 4/7 weeks Mild HA Probable musculoskeletal back pain  Plan: Reviewed issue of HAs in pregnancy. Rx Ibuprophen 600 mg po q 6 hours prn, #30, 1 refill. Comfort measures for HA and back pain reviewed. To f/u with any worsening of sx. [facility].   Nigel BridgemanLATHAM, Aeson Sawyers CNM, MSN 12/24/2014 3:10 PM

## 2014-12-24 NOTE — Discharge Instructions (Signed)

## 2014-12-24 NOTE — MAU Note (Signed)
Pt presents complaining of a sharp pain between her shoulder blades that comes and goes that started at 5am. Pt also has a headache that started yesterday. Has not taken medication for the pain. Reports eating and drinking like normal. Denies abdominal pain. Denies vaginal bleeding or discharge.

## 2015-01-04 ENCOUNTER — Inpatient Hospital Stay (HOSPITAL_COMMUNITY)
Admission: AD | Admit: 2015-01-04 | Discharge: 2015-01-04 | Disposition: A | Discharge status: Home / Self Care | Payer: Medicaid Other | Source: Ambulatory Visit | Attending: Obstetrics & Gynecology | Admitting: Obstetrics & Gynecology

## 2015-01-04 ENCOUNTER — Encounter (HOSPITAL_COMMUNITY): Payer: Self-pay | Admitting: *Deleted

## 2015-01-04 DIAGNOSIS — Z87891 Personal history of nicotine dependence: Secondary | ICD-10-CM | POA: Insufficient documentation

## 2015-01-04 DIAGNOSIS — O9989 Other specified diseases and conditions complicating pregnancy, childbirth and the puerperium: Secondary | ICD-10-CM | POA: Insufficient documentation

## 2015-01-04 DIAGNOSIS — M5441 Lumbago with sciatica, right side: Secondary | ICD-10-CM

## 2015-01-04 DIAGNOSIS — M545 Low back pain: Secondary | ICD-10-CM | POA: Diagnosis present

## 2015-01-04 DIAGNOSIS — M5430 Sciatica, unspecified side: Secondary | ICD-10-CM | POA: Insufficient documentation

## 2015-01-04 DIAGNOSIS — Z3A19 19 weeks gestation of pregnancy: Secondary | ICD-10-CM | POA: Insufficient documentation

## 2015-01-04 LAB — URINALYSIS, ROUTINE W REFLEX MICROSCOPIC
Bilirubin Urine: NEGATIVE
Glucose, UA: NEGATIVE mg/dL
Hgb urine dipstick: NEGATIVE
Ketones, ur: NEGATIVE mg/dL
Leukocytes, UA: NEGATIVE
Nitrite: NEGATIVE
PROTEIN: NEGATIVE mg/dL
SPECIFIC GRAVITY, URINE: 1.015 (ref 1.005–1.030)
Urobilinogen, UA: 0.2 mg/dL (ref 0.0–1.0)
pH: 5.5 (ref 5.0–8.0)

## 2015-01-04 MED ORDER — CYCLOBENZAPRINE HCL 5 MG PO TABS: 5.0000 mg | ORAL_TABLET | Freq: Three times a day (TID) | ORAL | Status: DC | PRN

## 2015-01-04 MED ORDER — ACETAMINOPHEN 500 MG PO TABS
1000.0000 mg | ORAL_TABLET | Freq: Once | ORAL | Status: AC
  Administered 2015-01-04: 1000 mg via ORAL
  Filled 2015-01-04: qty 2

## 2015-01-04 MED ORDER — CYCLOBENZAPRINE HCL 5 MG PO TABS: 5.0000 mg | ORAL_TABLET | Freq: Once | ORAL | Status: DC

## 2015-01-04 MED ORDER — ACETAMINOPHEN 500 MG PO TABS: 1000.0000 mg | ORAL_TABLET | Freq: Once | ORAL | Status: DC

## 2015-01-04 NOTE — MAU Note (Signed)
Pt states she woke about 0300 this morning she started feeling pain in her shoulders and in her back in and"my back kinda"

## 2015-01-04 NOTE — MAU Provider Note (Signed)
History    Claire ShownKazia M Nott is a 24 y.o. G1P0 who presents unannounced with complaints of right sided buttock pain.  Patient states pain originated in lower back and has radiated to buttock and describes it as sharp shooting pain now, but started as throbbing pain.  Patient states pain started this morning at 3 am and denies issues with bowel movements or fever.  Patient reports feelings of "a weight being lifted" with urination as well as an incident of incontinence yesterday.  Patient also states that she has been feeling very sad, but not depressed and reports recent break up with FOB.  Patient reports increased feelings of sadness, increased isolation, but still wants to be involved in activities and denies feelings of HI or SI.  Patient reports working, but is "sleeping more than I used to." Patient reports some fetal movement, but is unsure.  Patient denies VB, LOF, cramping or contractions.      Patient Active Problem List   Diagnosis Date Noted  . Cramping affecting pregnancy, antepartum 12/14/2014  . Abdominal pain, left lower quadrant 11/21/2014  . Chest pain due to psychological stress 11/12/2014  . Pregnancy related nausea, antepartum 10/12/2014  . Cellulitis and abscess of buttock 05/13/2013    Chief Complaint  Patient presents with  . Abdominal Pain  . Shoulder Pain   HPI  OB History    Gravida Para Term Preterm AB TAB SAB Ectopic Multiple Living   1         0      Past Medical History  Diagnosis Date  . PID (acute pelvic inflammatory disease)   . BV (bacterial vaginosis)   . Migraine     Past Surgical History  Procedure Laterality Date  . No past surgeries      Family History  Problem Relation Age of Onset  . Cancer Maternal Aunt     lung cancer  . Cancer Cousin     lung  . Hypertension Mother     History  Substance Use Topics  . Smoking status: Former Smoker -- 3 years    Quit date: 12/08/2012  . Smokeless tobacco: Former NeurosurgeonUser    Quit date: 10/02/2014   . Alcohol Use: No     Comment: in past    Allergies:  Allergies  Allergen Reactions  . Pollen Extract Itching and Other (See Comments)    Reaction:  Sneezing     Prescriptions prior to admission  Medication Sig Dispense Refill Last Dose  . Prenatal Vit-Fe Fumarate-FA (PRENATAL COMPLETE) 14-0.4 MG TABS Take 1 tablet by mouth daily. 90 each 1 01/04/2015 at Unknown time  . acetaminophen (TYLENOL) 325 MG tablet Take 2 tablets (650 mg total) by mouth every 6 (six) hours as needed for mild pain, moderate pain, fever or headache. (Patient not taking: Reported on 12/14/2014) 60 tablet 2   . ibuprofen (ADVIL,MOTRIN) 600 MG tablet Take 1 tablet (600 mg total) by mouth every 6 (six) hours as needed. (Patient not taking: Reported on 01/04/2015) 30 tablet 1   . ondansetron (ZOFRAN) 4 MG tablet Take 1 tablet (4 mg total) by mouth every 8 (eight) hours as needed for nausea or vomiting. (Patient not taking: Reported on 12/14/2014) 30 tablet 0 more than one month    ROS  See HPI Above Physical Exam   Blood pressure 96/60, pulse 94, temperature 98.6 F (37 C), temperature source Oral, resp. rate 18, height 5\' 3"  (1.6 m), weight 116 lb (52.617 kg), last menstrual period  08/23/2014, SpO2 99 %.  Results for orders placed or performed during the hospital encounter of 01/04/15 (from the past 24 hour(s))  Urinalysis, Routine w reflex microscopic     Status: None   Collection Time: 01/04/15  6:55 PM  Result Value Ref Range   Color, Urine YELLOW YELLOW   APPearance CLEAR CLEAR   Specific Gravity, Urine 1.015 1.005 - 1.030   pH 5.5 5.0 - 8.0   Glucose, UA NEGATIVE NEGATIVE mg/dL   Hgb urine dipstick NEGATIVE NEGATIVE   Bilirubin Urine NEGATIVE NEGATIVE   Ketones, ur NEGATIVE NEGATIVE mg/dL   Protein, ur NEGATIVE NEGATIVE mg/dL   Urobilinogen, UA 0.2 0.0 - 1.0 mg/dL   Nitrite NEGATIVE NEGATIVE   Leukocytes, UA NEGATIVE NEGATIVE    Physical Exam  Vitals reviewed. Constitutional: She is oriented to  person, place, and time. She appears well-developed and well-nourished. She appears distressed (Patient appears sad.  Bags under eyes, appears to have been crying. ).  HENT:  Head: Normocephalic and atraumatic.  Eyes: EOM are normal.  Neck: Normal range of motion.  Cardiovascular: Normal rate, regular rhythm and normal heart sounds.   Respiratory: Effort normal and breath sounds normal.  GI: Soft. Bowel sounds are normal.  Genitourinary:  Deferred  Musculoskeletal: Normal range of motion.  Neurological: She is alert and oriented to person, place, and time.  Skin: Skin is warm and dry.  Psychiatric: She has a normal mood and affect. Her behavior is normal.   FHR: 160 by doppler  ED Course  Assessment: IUP at 19.1wks Sciatica Pain Social Issues  Plan: -Discussed pain management and medication safety in pregnancy  -Discussed non-pharmacologic methods for pain relief -Flexeral Rx sent  -Given work excuse -Discussed social issues (i.e. Break up with FOB) and possibility of depression -Recommend and will refer for MH counseling  -Encouraged to call if any questions or concerns arise prior to next scheduled office visit.  -Keep appt as scheduled: 01/31/2015  [facility], Thamas Appleyard LYNN CNM, MSN 01/04/2015 7:47 PM

## 2015-01-04 NOTE — MAU Note (Signed)
Pt states when she is feeling weird pain when she voids, pt states feeling makes her stop midstream until the feeling goes away then she can finish. Pt states she had an incidence of incontinence yesterday, pt states she voided while driving and did not realize she had [place]

## 2015-01-04 NOTE — Progress Notes (Signed)
"  I dont feel anything now only when i walk"

## 2015-01-04 NOTE — MAU Note (Signed)
Constant pain from "shoulders to lower abdomen & left hip" since this morning. Denies vaginal bleeding/LOF/vaginal discharge.

## 2015-01-04 NOTE — Progress Notes (Signed)
Pt states she has pain when she walks and she feels pain in her back and shoulders as well

## 2015-01-04 NOTE — Discharge Instructions (Signed)
Sciatica Sciatica is pain, weakness, numbness, or tingling along the path of the sciatic nerve. The nerve starts in the lower back and runs down the back of each leg. The nerve controls the muscles in the lower leg and in the back of the knee, while also providing sensation to the back of the thigh, lower leg, and the sole of your foot. Sciatica is a symptom of another medical condition. For instance, nerve damage or certain conditions, such as a herniated disk or bone spur on the spine, pinch or put pressure on the sciatic nerve. This causes the pain, weakness, or other sensations normally associated with sciatica. Generally, sciatica only affects one side of the body. CAUSES   Herniated or slipped disc.  Degenerative disk disease.  A pain disorder involving the narrow muscle in the buttocks (piriformis syndrome).  Pelvic injury or fracture.  Pregnancy.  Tumor (rare). SYMPTOMS  Symptoms can vary from mild to very severe. The symptoms usually travel from the low back to the buttocks and down the back of the leg. Symptoms can include:  Mild tingling or dull aches in the lower back, leg, or hip.  Numbness in the back of the calf or sole of the foot.  Burning sensations in the lower back, leg, or hip.  Sharp pains in the lower back, leg, or hip.  Leg weakness.  Severe back pain inhibiting movement. These symptoms may get worse with coughing, sneezing, laughing, or prolonged sitting or standing. Also, being overweight may worsen symptoms. DIAGNOSIS  Your caregiver will perform a physical exam to look for common symptoms of sciatica. He or she may ask you to do certain movements or activities that would trigger sciatic nerve pain. Other tests may be performed to find the cause of the sciatica. These may include:  Blood tests.  X-rays.  Imaging tests, such as an MRI or CT scan. TREATMENT  Treatment is directed at the cause of the sciatic pain. Sometimes, treatment is not necessary  and the pain and discomfort goes away on its own. If treatment is needed, your caregiver may suggest:  Over-the-counter medicines to relieve pain.  Prescription medicines, such as anti-inflammatory medicine, muscle relaxants, or narcotics.  Applying heat or ice to the painful area.  Steroid injections to lessen pain, irritation, and inflammation around the nerve.  Reducing activity during periods of pain.  Exercising and stretching to strengthen your abdomen and improve flexibility of your spine. Your caregiver may suggest losing weight if the extra weight makes the back pain worse.  Physical therapy.  Surgery to eliminate what is pressing or pinching the nerve, such as a bone spur or part of a herniated disk. HOME CARE INSTRUCTIONS   Only take over-the-counter or prescription medicines for pain or discomfort as directed by your caregiver.  Apply ice to the affected area for 20 minutes, 3-4 times a day for the first 48-72 hours. Then try heat in the same way.  Exercise, stretch, or perform your usual activities if these do not aggravate your pain.  Attend physical therapy sessions as directed by your caregiver.  Keep all follow-up appointments as directed by your caregiver.  Do not wear high heels or shoes that do not provide proper support.  Check your mattress to see if it is too soft. A firm mattress may lessen your pain and discomfort. SEEK IMMEDIATE MEDICAL CARE IF:   You lose control of your bowel or bladder (incontinence).  You have increasing weakness in the lower back, pelvis, buttocks,   or legs.  You have redness or swelling of your back.  You have a burning sensation when you urinate.  You have pain that gets worse when you lie down or awakens you at night.  Your pain is worse than you have experienced in the past.  Your pain is lasting longer than 4 weeks.  You are suddenly losing weight without reason. MAKE SURE YOU:  Understand these  instructions.  Will watch your condition.  Will get help right away if you are not doing well or get worse. Document Released: 09/10/2001 Document Revised: 03/17/2012 Document Reviewed: 01/26/2012 ExitCare Patient Information 2015 [employer]. This information is not intended to replace advice given to you by your health care provider. Make sure you discuss any questions you have with your health care provider.  

## 2015-01-06 ENCOUNTER — Inpatient Hospital Stay (HOSPITAL_COMMUNITY)
Admission: AD | Admit: 2015-01-06 | Discharge: 2015-01-06 | Discharge status: Against Medical Advice | Payer: Medicaid Other | Source: Ambulatory Visit | Attending: Obstetrics and Gynecology | Admitting: Obstetrics and Gynecology

## 2015-01-06 ENCOUNTER — Encounter (HOSPITAL_COMMUNITY): Payer: Self-pay | Admitting: *Deleted

## 2015-01-06 DIAGNOSIS — Z5321 Procedure and treatment not carried out due to patient leaving prior to being seen by health care provider: Secondary | ICD-10-CM | POA: Insufficient documentation

## 2015-01-06 DIAGNOSIS — Z3A19 19 weeks gestation of pregnancy: Secondary | ICD-10-CM | POA: Insufficient documentation

## 2015-01-06 DIAGNOSIS — M79604 Pain in right leg: Secondary | ICD-10-CM | POA: Diagnosis present

## 2015-01-06 DIAGNOSIS — O9989 Other specified diseases and conditions complicating pregnancy, childbirth and the puerperium: Secondary | ICD-10-CM | POA: Insufficient documentation

## 2015-01-06 NOTE — MAU Note (Signed)
Patient asking to leave AMA. CNM notified.  Pt signed AMA form, educated that the MD or CNM would like to see her.  Pt understanding. Left AMA.

## 2015-01-06 NOTE — MAU Note (Signed)
Pt presents with left and right leg pain, From hip to feet.  Pt was seen two days ago with Left leg pain.  Took flexeril 01/05/2015. Has not tried any stretches. Denies Vaginal bleeding or discharge.

## 2015-01-13 ENCOUNTER — Encounter (HOSPITAL_COMMUNITY): Payer: Self-pay | Admitting: *Deleted

## 2015-01-13 ENCOUNTER — Inpatient Hospital Stay (HOSPITAL_COMMUNITY)
Admission: AD | Admit: 2015-01-13 | Discharge: 2015-01-14 | Disposition: A | Discharge status: Home / Self Care | Payer: Medicaid Other | Source: Ambulatory Visit | Attending: Obstetrics and Gynecology | Admitting: Obstetrics and Gynecology

## 2015-01-13 DIAGNOSIS — O9989 Other specified diseases and conditions complicating pregnancy, childbirth and the puerperium: Secondary | ICD-10-CM | POA: Insufficient documentation

## 2015-01-13 DIAGNOSIS — K5909 Other constipation: Secondary | ICD-10-CM

## 2015-01-13 DIAGNOSIS — K5900 Constipation, unspecified: Secondary | ICD-10-CM | POA: Insufficient documentation

## 2015-01-13 DIAGNOSIS — Z3A20 20 weeks gestation of pregnancy: Secondary | ICD-10-CM | POA: Insufficient documentation

## 2015-01-13 MED ORDER — DOCUSATE SODIUM 100 MG PO CAPS: 100.0000 mg | ORAL_CAPSULE | Freq: Two times a day (BID) | ORAL | Status: DC

## 2015-01-13 NOTE — Discharge Instructions (Signed)
Constipation °Constipation is when a person: °· Poops (has a bowel movement) less than 3 times a week. °· Has a hard time pooping. °· Has poop that is dry, hard, or bigger than normal. °HOME CARE  °· Eat foods with a lot of fiber in them. This includes fruits, vegetables, beans, and whole grains such as brown rice. °· Avoid fatty foods and foods with a lot of sugar. This includes french fries, hamburgers, cookies, candy, and soda. °· If you are not getting enough fiber from food, take products with added fiber in them (supplements). °· Drink enough fluid to keep your pee (urine) clear or pale yellow. °· Exercise on a regular basis, or as told by your doctor. °· Go to the restroom when you feel like you need to poop. Do not hold it. °· Only take medicine as told by your doctor. Do not take medicines that help you poop (laxatives) without talking to your doctor first. °GET HELP RIGHT AWAY IF:  °· You have bright red blood in your poop (stool). °· Your constipation lasts more than 4 days or gets worse. °· You have belly (abdominal) or butt (rectal) pain. °· You have thin poop (as thin as a pencil). °· You lose weight, and it cannot be explained. °MAKE SURE YOU:  °· Understand these instructions. °· Will watch your condition. °· Will get help right away if you are not doing well or get worse. °Document Released: 03/04/2008 Document Revised: 09/21/2013 Document Reviewed: 06/28/2013 °ExitCare® Patient Information ©2015 [employer]. This information is not intended to replace advice given to you by your health care provider. Make sure you discuss any questions you have with your health care provider. °High-Fiber Diet °Fiber is found in fruits, vegetables, and grains. A high-fiber diet encourages the addition of more whole grains, legumes, fruits, and vegetables in your diet. The recommended amount of fiber for adult males is 38 g per day. For adult females, it is 25 g per day. Pregnant and lactating women should get 28 g  of fiber per day. If you have a digestive or bowel problem, ask your caregiver for advice before adding high-fiber foods to your diet. Eat a variety of high-fiber foods instead of only a select few type of foods.  °PURPOSE °· To increase stool bulk. °· To make bowel movements more regular to prevent constipation. °· To lower cholesterol. °· To prevent overeating. °WHEN IS THIS DIET USED? °· It may be used if you have constipation and hemorrhoids. °· It may be used if you have uncomplicated diverticulosis (intestine condition) and irritable bowel syndrome. °· It may be used if you need help with weight management. °· It may be used if you want to add it to your diet as a protective measure against atherosclerosis, diabetes, and cancer. °SOURCES OF FIBER °· Whole-grain breads and cereals. °· Fruits, such as apples, oranges, bananas, berries, prunes, and pears. °· Vegetables, such as green peas, carrots, sweet potatoes, beets, broccoli, cabbage, spinach, and artichokes. °· Legumes, such split peas, soy, lentils. °· Almonds. °FIBER CONTENT IN FOODS °Starches and Grains / Dietary Fiber (g) °· Cheerios, 1 cup / 3 g °· Corn Flakes cereal, 1 cup / 0.7 g °· Rice crispy treat cereal, 1¼ cup / 0.3 g °· Instant oatmeal (cooked), ½ cup / 2 g °· Frosted wheat cereal, 1 cup / 5.1 g °· Brown, long-grain rice (cooked), 1 cup / 3.5 g °· White, long-grain rice (cooked), 1 cup / 0.6 g °· Enriched   macaroni (cooked), 1 cup / 2.5 g °Legumes / Dietary Fiber (g) °· Baked beans (canned, plain, or vegetarian), ½ cup / 5.2 g °· Kidney beans (canned), ½ cup / 6.8 g °· Pinto beans (cooked), ½ cup / 5.5 g °Breads and Crackers / Dietary Fiber (g) °· Plain or honey graham crackers, 2 squares / 0.7 g °· Saltine crackers, 3 squares / 0.3 g °· Plain, salted pretzels, 10 pieces / 1.8 g °· Whole-wheat bread, 1 slice / 1.9 g °· White bread, 1 slice / 0.7 g °· Raisin bread, 1 slice / 1.2 g °· Plain bagel, 3 oz / 2 g °· Flour tortilla, 1 oz / 0.9 g °· Corn  tortilla, 1 small / 1.5 g °· Hamburger or hotdog bun, 1 small / 0.9 g °Fruits / Dietary Fiber (g) °· Apple with skin, 1 medium / 4.4 g °· Sweetened applesauce, ½ cup / 1.5 g °· Banana, ½ medium / 1.5 g °· Grapes, 10 grapes / 0.4 g °· Orange, 1 small / 2.3 g °· Raisin, 1.5 oz / 1.6 g °· Melon, 1 cup / 1.4 g °Vegetables / Dietary Fiber (g) °· Green beans (canned), ½ cup / 1.3 g °· Carrots (cooked), ½ cup / 2.3 g °· Broccoli (cooked), ½ cup / 2.8 g °· Peas (cooked), ½ cup / 4.4 g °· Mashed potatoes, ½ cup / 1.6 g °· Lettuce, 1 cup / 0.5 g °· Corn (canned), ½ cup / 1.6 g °· Tomato, ½ cup / 1.1 g °Document Released: 09/16/2005 Document Revised: 03/17/2012 Document Reviewed: 12/19/2011 °ExitCare® Patient Information ©2015 [employer]. This information is not intended to replace advice given to you by your health care provider. Make sure you discuss any questions you have with your health care provider. ° °

## 2015-01-13 NOTE — Progress Notes (Signed)
Gerrit HeckJessica Emly CNM notified of pt's admission and status. CNM in delivery in BS and will see pt when finished. Pt aware

## 2015-01-13 NOTE — MAU Note (Signed)
I am very constipated. Very bloated. When i go not sure if i'm getting all the stool out. Stool is hard balls.

## 2015-01-13 NOTE — MAU Provider Note (Signed)
History    Candice White is a 24y.o. G1P0 at 20.3wks who presents, unannounced, for constipation.  Patient reports having hard stool prior to arriving and "it was two little balls."  Patient reports no bowel for last two days, which is normal for her regime, but prior to tonight no issues with hard stools. Patient reports no vaginal bleeding, hemorrhoids, and cramping.  Reports feelings of bloating and fetal movement.  Patient with questions regarding velamentous cord insertion, but unable to articulate them in a manner in which provider understands.    Diet Recall: [place] sandwich, broccoli soup Chex mix and pineapple/strawberries Pizza (2 slices) No dinner 2-3 bottles Water and 1 cup watermelon juice  Patient Active Problem List   Diagnosis Date Noted  . Cramping affecting pregnancy, antepartum 12/14/2014  . Abdominal pain, left lower quadrant 11/21/2014  . Chest pain due to psychological stress 11/12/2014  . Pregnancy related nausea, antepartum 10/12/2014  . Cellulitis and abscess of buttock 05/13/2013    Chief Complaint  Patient presents with  . Constipation   HPI  OB History    Gravida Para Term Preterm AB TAB SAB Ectopic Multiple Living   1         0      Past Medical History  Diagnosis Date  . PID (acute pelvic inflammatory disease)   . BV (bacterial vaginosis)   . Migraine     Past Surgical History  Procedure Laterality Date  . No past surgeries      Family History  Problem Relation Age of Onset  . Cancer Maternal Aunt     lung cancer  . Cancer Cousin     lung  . Hypertension Mother     History  Substance Use Topics  . Smoking status: Former Smoker -- 3 years    Quit date: 12/08/2012  . Smokeless tobacco: Former Neurosurgeon    Quit date: 10/02/2014  . Alcohol Use: No     Comment: in past    Allergies: No Known Allergies  Prescriptions prior to admission  Medication Sig Dispense Refill Last Dose  . acetaminophen (TYLENOL) 325 MG tablet Take 2 tablets  (650 mg total) by mouth every 6 (six) hours as needed for mild pain, moderate pain, fever or headache. (Patient taking differently: Take 650 mg by mouth every 6 (six) hours as needed for mild pain, moderate pain, fever or headache. ) 60 tablet 2 Past Month at Unknown time  . Prenatal Vit-Fe Fumarate-FA (PRENATAL COMPLETE) 14-0.4 MG TABS Take 1 tablet by mouth daily. 90 each 1 01/13/2015 at Unknown time  . cyclobenzaprine (FLEXERIL) 5 MG tablet Take 1-2 tablets (5-10 mg total) by mouth every 8 (eight) hours as needed for muscle spasms. 30 tablet 0 01/05/2015 at Unknown time  . ibuprofen (ADVIL,MOTRIN) 600 MG tablet Take 1 tablet (600 mg total) by mouth every 6 (six) hours as needed. (Patient not taking: Reported on 01/04/2015) 30 tablet 1   . ondansetron (ZOFRAN) 4 MG tablet Take 1 tablet (4 mg total) by mouth every 8 (eight) hours as needed for nausea or vomiting. (Patient not taking: Reported on 12/14/2014) 30 tablet 0 More than a month at Unknown time    ROS  See HPI Above Physical Exam   Blood pressure 106/56, pulse 89, temperature 98.3 F (36.8 C), resp. rate 18, height  (1.6 m), weight 117 lb 12.8 oz (53.434 kg), last menstrual period 08/23/2014.  No results found for this or any previous visit (from the past  24 hour(s)).  Physical Exam Deferred  FHR: 145 by doppler  ED Course  Assessment: IUP at 24y.o. Constipation  Plan: -Discussed emergent vs non-emergent situations -Encouraged to call, prior to presenting at MAU, for pregnancy related questions and concerns -Given some information of velamentous cord insertion and need for monitoring -Educated on adequate nutrition, hydration, and high fiber diet -Rx for colace 100mg  BID  Disp 45, RF 2 -Instructed to take colace as ordered and can titrate once stools soften x 3 stools -Reports no call regarding MH referral---patient informed that referral has been submitted -No other questions or concerns -Keep appt as scheduled: May  5 -Patient requests and given work excuse with [employer] on 4/17 -Discharged to home in stable condition  Othello Sgroi LYNN CNM, MSN 01/13/2015 11:45 PM

## 2015-01-14 DIAGNOSIS — K5900 Constipation, unspecified: Secondary | ICD-10-CM | POA: Diagnosis present

## 2015-01-14 DIAGNOSIS — O9989 Other specified diseases and conditions complicating pregnancy, childbirth and the puerperium: Secondary | ICD-10-CM | POA: Diagnosis not present

## 2015-01-14 DIAGNOSIS — Z3A20 20 weeks gestation of pregnancy: Secondary | ICD-10-CM | POA: Diagnosis not present

## 2015-01-14 NOTE — Progress Notes (Signed)
Gerrit HeckJessica Emly CNM in to see pt. Written and verbal d/c instructions given and understanding voiced. Work note given

## 2015-01-21 ENCOUNTER — Inpatient Hospital Stay (HOSPITAL_COMMUNITY)
Admission: AD | Admit: 2015-01-21 | Discharge: 2015-01-21 | Disposition: A | Discharge status: Home / Self Care | Payer: Medicaid Other | Source: Ambulatory Visit | Attending: Obstetrics and Gynecology | Admitting: Obstetrics and Gynecology

## 2015-01-21 ENCOUNTER — Encounter (HOSPITAL_COMMUNITY): Payer: Self-pay | Admitting: *Deleted

## 2015-01-21 DIAGNOSIS — Z3A22 22 weeks gestation of pregnancy: Secondary | ICD-10-CM | POA: Insufficient documentation

## 2015-01-21 DIAGNOSIS — O368190 Decreased fetal movements, unspecified trimester, not applicable or unspecified: Secondary | ICD-10-CM

## 2015-01-21 DIAGNOSIS — Z87891 Personal history of nicotine dependence: Secondary | ICD-10-CM | POA: Diagnosis not present

## 2015-01-21 DIAGNOSIS — O368120 Decreased fetal movements, second trimester, not applicable or unspecified: Secondary | ICD-10-CM | POA: Diagnosis present

## 2015-01-21 NOTE — MAU Note (Signed)
Usually feel movement from baby all day but today I have not felt any movement

## 2015-01-21 NOTE — MAU Provider Note (Signed)
  History  24 yo G1P0 @ 22.0 wks presents unannounced to MAU due to no fetal movement today. Denies VB, ctxs or LOF.   Patient Active Problem List   Diagnosis Date Noted  . Decreased fetal movement 01/21/2015  . Chest pain due to psychological stress 11/12/2014  . Cellulitis and abscess of buttock 05/13/2013    Chief Complaint  Patient presents with  . Decreased Fetal Movement   HPI As above OB History    Gravida Para Term Preterm AB TAB SAB Ectopic Multiple Living   1         0      Past Medical History  Diagnosis Date  . PID (acute pelvic inflammatory disease)   . BV (bacterial vaginosis)   . Migraine     Past Surgical History  Procedure Laterality Date  . No past surgeries      Family History  Problem Relation Age of Onset  . Cancer Maternal Aunt     lung cancer  . Cancer Cousin     lung  . Hypertension Mother     History  Substance Use Topics  . Smoking status: Former Smoker -- 3 years    Quit date: 12/08/2012  . Smokeless tobacco: Former NeurosurgeonUser    Quit date: 10/02/2014  . Alcohol Use: No     Comment: in past    Allergies: No Known Allergies  Prescriptions prior to admission  Medication Sig Dispense Refill Last Dose  . docusate sodium (COLACE) 100 MG capsule Take 1 capsule (100 mg total) by mouth 2 (two) times daily. 45 capsule 2     ROS  -FM Physical Exam   Blood pressure 95/56, pulse 83, resp. rate 18, height 5\' 3"  (1.6 m), weight 120 lb 12.8 oz (54.795 kg), last menstrual period 08/23/2014.    Physical Exam Gen: NAD Abdomen: gravid, soft, NT Pelvic: Deferred Doptones: 135 ED Course  Assessment: +Doptones  Plan: Educated on fetal movement for this GA. Informed that FKCs will begin at 28 wks OB f/u as scheduled   Of note, lying on right side; acknowledges movement   Sherre ScarletWILLIAMS, Candice Frison CNM, MS 01/21/2015 11:43 PM

## 2015-02-02 ENCOUNTER — Inpatient Hospital Stay (HOSPITAL_COMMUNITY)
Admission: AD | Admit: 2015-02-02 | Discharge: 2015-02-02 | Discharge status: Against Medical Advice | Payer: Medicaid Other | Source: Ambulatory Visit | Attending: Obstetrics and Gynecology | Admitting: Obstetrics and Gynecology

## 2015-02-02 ENCOUNTER — Encounter (HOSPITAL_COMMUNITY): Payer: Self-pay | Admitting: *Deleted

## 2015-02-02 DIAGNOSIS — R109 Unspecified abdominal pain: Secondary | ICD-10-CM | POA: Diagnosis present

## 2015-02-02 DIAGNOSIS — O479 False labor, unspecified: Secondary | ICD-10-CM | POA: Diagnosis present

## 2015-02-02 DIAGNOSIS — Z3A23 23 weeks gestation of pregnancy: Secondary | ICD-10-CM | POA: Diagnosis not present

## 2015-02-02 DIAGNOSIS — Z87891 Personal history of nicotine dependence: Secondary | ICD-10-CM | POA: Insufficient documentation

## 2015-02-02 DIAGNOSIS — O368120 Decreased fetal movements, second trimester, not applicable or unspecified: Secondary | ICD-10-CM | POA: Diagnosis not present

## 2015-02-02 LAB — WET PREP, GENITAL
CLUE CELLS WET PREP: NONE SEEN
TRICH WET PREP: NONE SEEN
Yeast Wet Prep HPF POC: NONE SEEN

## 2015-02-02 LAB — URINALYSIS, ROUTINE W REFLEX MICROSCOPIC
Bilirubin Urine: NEGATIVE
GLUCOSE, UA: NEGATIVE mg/dL
Hgb urine dipstick: NEGATIVE
Ketones, ur: NEGATIVE mg/dL
LEUKOCYTES UA: NEGATIVE
Nitrite: NEGATIVE
PH: 7.5 (ref 5.0–8.0)
Protein, ur: NEGATIVE mg/dL
Specific Gravity, Urine: 1.02 (ref 1.005–1.030)
Urobilinogen, UA: 0.2 mg/dL (ref 0.0–1.0)

## 2015-02-02 LAB — FETAL FIBRONECTIN: FETAL FIBRONECTIN: NEGATIVE

## 2015-02-02 NOTE — MAU Provider Note (Signed)
Addendum Pt left AMA prior to fFN resulting.

## 2015-02-02 NOTE — MAU Provider Note (Signed)
Claire ShownKazia M Meritt is a 24 y.o. G1P0 at 23.2 weeks present to MAu unannounced c/o cramping on an of since yesterday with pain radiating down her legs.  She denies vb or lof.      History     Patient Active Problem List   Diagnosis Date Noted  . Decreased fetal movement 01/21/2015  . Chest pain due to psychological stress 11/12/2014  . Cellulitis and abscess of buttock 05/13/2013    Chief Complaint  Patient presents with  . Abdominal Cramping   HPI  OB History    Gravida Para Term Preterm AB TAB SAB Ectopic Multiple Living   1         0      Past Medical History  Diagnosis Date  . PID (acute pelvic inflammatory disease)   . BV (bacterial vaginosis)   . Migraine     Past Surgical History  Procedure Laterality Date  . No past surgeries      Family History  Problem Relation Age of Onset  . Cancer Maternal Aunt     lung cancer  . Cancer Cousin     lung  . Hypertension Mother     History  Substance Use Topics  . Smoking status: Former Smoker -- 3 years    Quit date: 12/08/2012  . Smokeless tobacco: Former NeurosurgeonUser    Quit date: 10/02/2014  . Alcohol Use: No     Comment: in past    Allergies: No Known Allergies  Prescriptions prior to admission  Medication Sig Dispense Refill Last Dose  . Prenatal Vit-Fe Fumarate-FA (PRENATAL MULTIVITAMIN) TABS tablet Take 1 tablet by mouth daily at 12 noon.   02/02/2015 at Unknown time  . docusate sodium (COLACE) 100 MG capsule Take 1 capsule (100 mg total) by mouth 2 (two) times daily. (Patient not taking: Reported on 02/02/2015) 45 capsule 2     ROS See HPI above, all other systems are negative  Physical Exam   Blood pressure 102/52, pulse 89, temperature 98.3 F (36.8 C), resp. rate 18, height 5\' 3"  (1.6 m), weight 121 lb 12.8 oz (55.248 kg), last menstrual period 08/23/2014.  Physical Exam Ext:  WNL ABD: Soft, non tender to palpation, no rebound or guarding SVE:   ED Course  Assessment: IUP at  23.2 weeks Membranes:  light dampness noted FHR: 130, appropriate for GA CTX:  None noted   Plan: Labs: fFN, GC/CT, wet, fern    Caitlinn Klinker, CNM, MSN 02/02/2015. 10:54 AM

## 2015-02-02 NOTE — MAU Note (Signed)
Pt stated she is having abd cramping on and off since yesterday. Pain radiates down towards her legs

## 2015-02-02 NOTE — MAU Note (Signed)
Pt stated she needed to leave for an appoint. Explained to her we were still waiting on her labs to come back . Pt still needed to leave. notified V.Standard,CNM of pt decision to leave.

## 2015-02-03 LAB — GC/CHLAMYDIA PROBE AMP (~~LOC~~) NOT AT ARMC
CHLAMYDIA, DNA PROBE: NEGATIVE
Neisseria Gonorrhea: NEGATIVE

## 2015-03-20 ENCOUNTER — Encounter (HOSPITAL_COMMUNITY): Payer: Self-pay | Admitting: *Deleted

## 2015-03-20 ENCOUNTER — Inpatient Hospital Stay (HOSPITAL_COMMUNITY)
Admission: AD | Admit: 2015-03-20 | Discharge: 2015-03-21 | Disposition: A | Discharge status: Home / Self Care | Payer: Medicaid Other | Source: Ambulatory Visit | Attending: Obstetrics & Gynecology | Admitting: Obstetrics & Gynecology

## 2015-03-20 DIAGNOSIS — Z87891 Personal history of nicotine dependence: Secondary | ICD-10-CM | POA: Insufficient documentation

## 2015-03-20 DIAGNOSIS — O212 Late vomiting of pregnancy: Secondary | ICD-10-CM | POA: Diagnosis not present

## 2015-03-20 DIAGNOSIS — R110 Nausea: Secondary | ICD-10-CM

## 2015-03-20 DIAGNOSIS — G43909 Migraine, unspecified, not intractable, without status migrainosus: Secondary | ICD-10-CM

## 2015-03-20 DIAGNOSIS — Z3A30 30 weeks gestation of pregnancy: Secondary | ICD-10-CM | POA: Diagnosis not present

## 2015-03-20 DIAGNOSIS — R12 Heartburn: Secondary | ICD-10-CM | POA: Diagnosis not present

## 2015-03-20 DIAGNOSIS — O26893 Other specified pregnancy related conditions, third trimester: Secondary | ICD-10-CM

## 2015-03-20 LAB — URINALYSIS, ROUTINE W REFLEX MICROSCOPIC
Bilirubin Urine: NEGATIVE
Glucose, UA: NEGATIVE mg/dL
Hgb urine dipstick: NEGATIVE
KETONES UR: NEGATIVE mg/dL
Leukocytes, UA: NEGATIVE
NITRITE: NEGATIVE
PROTEIN: NEGATIVE mg/dL
SPECIFIC GRAVITY, URINE: 1.02 (ref 1.005–1.030)
Urobilinogen, UA: 0.2 mg/dL (ref 0.0–1.0)
pH: 6 (ref 5.0–8.0)

## 2015-03-20 MED ORDER — LACTATED RINGERS IV BOLUS (SEPSIS)
500.0000 mL | Freq: Once | INTRAVENOUS | Status: AC
  Administered 2015-03-20: 500 mL via INTRAVENOUS

## 2015-03-20 MED ORDER — PANTOPRAZOLE SODIUM 40 MG IV SOLR
40.0000 mg | Freq: Once | INTRAVENOUS | Status: AC
  Administered 2015-03-20: 40 mg via INTRAVENOUS
  Filled 2015-03-20: qty 40

## 2015-03-20 MED ORDER — ONDANSETRON HCL 4 MG/2ML IJ SOLN
4.0000 mg | Freq: Once | INTRAMUSCULAR | Status: AC
  Administered 2015-03-20: 4 mg via INTRAVENOUS
  Filled 2015-03-20: qty 2

## 2015-03-20 MED ORDER — LACTATED RINGERS IV SOLN: INTRAVENOUS | Status: DC

## 2015-03-20 NOTE — MAU Note (Signed)
PT  SAYS SHE FEELS  NAUSEA  X3 WEEKS -  NO EVERY DAY  -     AFTER SHE  EATS.    SHE  DOES  NOT VOMITING      WAS IN  OFFICE  ON 6-3-  WAS NOT NAUSEATED  THEN  SO DID NOT  TELL THEM .  HAS NOT  CALLED  OFFICE  ABOUT  THIS.      SAYS HER FEET  ARE SWOLLEN- STARTED  TODAY... SAYS HAS CRAMPS  IN LEGS/ HANDS   X3  WEEKS-   DID NOT  CALL OFFICE  ABOUT  THIS.     LAST SEX-    THIS  MORNING   DENIES HSV AND MRSA.

## 2015-03-21 DIAGNOSIS — O26893 Other specified pregnancy related conditions, third trimester: Secondary | ICD-10-CM

## 2015-03-21 DIAGNOSIS — R12 Heartburn: Secondary | ICD-10-CM

## 2015-03-21 DIAGNOSIS — G43909 Migraine, unspecified, not intractable, without status migrainosus: Secondary | ICD-10-CM

## 2015-03-21 DIAGNOSIS — R110 Nausea: Secondary | ICD-10-CM

## 2015-03-21 NOTE — MAU Provider Note (Signed)
  History  24 yo G1P0 @ 30.3 wks presents to MAU unannounced w/ c/o intermittent nausea x 3 wks and heartburn. +FM. Denies bleeding, leaking or vomiting. Patient Active Problem List   Diagnosis Date Noted  . Heartburn during pregnancy in third trimester 03/21/2015  . Nausea 03/21/2015  . Migraine 03/21/2015    Chief Complaint  Patient presents with  . Nausea   HPI As above OB History    Gravida Para Term Preterm AB TAB SAB Ectopic Multiple Living   1         0      Past Medical History  Diagnosis Date  . PID (acute pelvic inflammatory disease)   . BV (bacterial vaginosis)   . Migraine     Past Surgical History  Procedure Laterality Date  . No past surgeries      Family History  Problem Relation Age of Onset  . Cancer Maternal Aunt     lung cancer  . Cancer Cousin     lung  . Hypertension Mother     History  Substance Use Topics  . Smoking status: Former Smoker -- 3 years    Quit date: 12/08/2012  . Smokeless tobacco: Former Neurosurgeon    Quit date: 10/02/2014  . Alcohol Use: No     Comment: in past    Allergies: No Known Allergies  No prescriptions prior to admission    ROS  As noted in History Physical Exam   Blood pressure 98/60, pulse 79, temperature 98 F (36.7 C), temperature source Oral, resp. rate 18, height 5\' 2"  (1.575 m), weight 56.303 kg (124 lb 2 oz), last menstrual period 08/23/2014.    Physical Exam Gen: NAD Abdomen: gravid, soft, NTND Pelvic: Deferred Ext: WNL FHRT: Reassuring for GA Ctxs: None ED Course  Assessment: Nausea & Heartburn --improved w/ IVFs, Zofran and Protonix.  Plan: Declined script for above meds. Common discomforts of pregnancy reviewed, along w/ their relief measures. PTL precautions. OB f/u as scheduled.   Sherre Scarlet CNM, MS 03/21/15, 12:11 AM

## 2015-03-21 NOTE — Discharge Instructions (Signed)
Morning Sickness Morning sickness is when you feel sick to your stomach (nauseous) during pregnancy. You may feel sick to your stomach and throw up (vomit). You may feel sick in the morning, but you can feel this way any time of day. Some women feel very sick to their stomach and cannot stop throwing up (hyperemesis gravidarum). HOME CARE  Only take medicines as told by your doctor.  Take multivitamins as told by your doctor. Taking multivitamins before getting pregnant can stop or lessen the harshness of morning sickness.  Eat dry toast or unsalted crackers before getting out of bed.  Eat 5 to 6 small meals a day.  Eat dry and bland foods like rice and baked potatoes.  Do not drink liquids with meals. Drink between meals.  Do not eat greasy, fatty, or spicy foods.  Have someone cook for you if the smell of food causes you to feel sick or throw up.  If you feel sick to your stomach after taking prenatal vitamins, take them at night or with a snack.  Eat protein when you need a snack (nuts, yogurt, cheese).  Eat unsweetened gelatins for dessert.  Wear a bracelet used for sea sickness (acupressure wristband).  Go to a doctor that puts thin needles into certain body points (acupuncture) to improve how you feel.  Do not smoke.  Use a humidifier to keep the air in your house free of odors.  Get lots of fresh air. GET HELP IF:  You need medicine to feel better.  You feel dizzy or lightheaded.  You are losing weight. GET HELP RIGHT AWAY IF:   You feel very sick to your stomach and cannot stop throwing up.  You pass out (faint). MAKE SURE YOU:  Understand these instructions.  Will watch your condition.  Will get help right away if you are not doing well or get worse. Document Released: 10/24/2004 Document Revised: 09/21/2013 Document Reviewed: 03/03/2013 [facility] Patient Information 2015 [place], [place]. This information is not intended to replace advice given to you by  your health care provider. Make sure you discuss any questions you have with your health care provider. Heartburn During Pregnancy  Heartburn is a burning sensation in the chest caused by stomach acid backing up into the esophagus. Heartburn is common in pregnancy because a certain hormone (progesterone) is released when a woman is pregnant. The progesterone hormone may relax the valve that separates the esophagus from the stomach. This allows acid to go up into the esophagus, causing heartburn. Heartburn may also happen in pregnancy because the enlarging uterus pushes up on the stomach, which pushes more acid into the esophagus. This is especially true in the later stages of pregnancy. Heartburn problems usually go away after giving birth. CAUSES  Heartburn is caused by stomach acid backing up into the esophagus. During pregnancy, this may result from various things, including:   The progesterone hormone.  Changing hormone levels.  The growing uterus pushing stomach acid upward.  Large meals.  Certain foods and drinks.  Exercise.  Increased acid production. SIGNS AND SYMPTOMS   Burning pain in the chest or lower throat.  Bitter taste in the mouth.  Coughing. DIAGNOSIS  Your health care provider will typically diagnose heartburn by taking a careful history of your concern. Blood tests may be done to check for a certain type of bacteria that is associated with heartburn. Sometimes, heartburn is diagnosed by prescribing a heartburn medicine to see if the symptoms improve. In some cases, a  procedure called an endoscopy may be done. In this procedure, a tube with a light and a camera on the end (endoscope) is used to examine the esophagus and the stomach. TREATMENT  Treatment will vary depending on the severity of your symptoms. Your health care provider may recommend:  Over-the-counter medicines (antacids, acid reducers) for mild heartburn.  Prescription medicines to decrease stomach  acid or to protect your stomach lining.  Certain changes in your diet.  Elevating the head of your bed by putting blocks under the legs. This helps prevent stomach acid from backing up into the esophagus when you are lying down. HOME CARE INSTRUCTIONS   Only take over-the-counter or prescription medicines as directed by your health care provider.  Raise the head of your bed by putting blocks under the legs if instructed to do so by your health care provider. Sleeping with more pillows is not effective because it only changes the position of your head.  Do not exercise right after eating.  Avoid eating 2-3 hours before bed. Do not lie down right after eating.  Eat small meals throughout the day instead of three large meals.  Identify foods and beverages that make your symptoms worse and avoid them. Foods you may want to avoid include:  Peppers.  Chocolate.  High-fat foods, including fried foods.  Spicy foods.  Garlic and onions.  Citrus fruits, including oranges, grapefruit, lemons, and limes.  Food containing tomatoes or tomato products.  Mint.  Carbonated and caffeinated drinks.  Vinegar. SEEK MEDICAL CARE IF:  You have abdominal pain of any kind.  You feel burning in your upper abdomen or chest, especially after eating or lying down.  You have nausea and vomiting.  Your stomach feels upset after you eat. SEEK IMMEDIATE MEDICAL CARE IF:   You have severe chest pain that goes down your arm or into your jaw or neck.  You feel sweaty, dizzy, or light-headed.  You become short of breath.  You vomit blood.  You have difficulty or pain with swallowing.  You have bloody or black, tarry stools.  You have episodes of heartburn more than 3 times a week, for more than 2 weeks. MAKE SURE YOU:  Understand these instructions.  Will watch your condition.  Will get help right away if you are not doing well or get worse. Document Released: 09/13/2000 Document  Revised: 09/21/2013 Document Reviewed: 05/05/2013 [facility] Patient Information 2015 [place], [place]. This information is not intended to replace advice given to you by your health care provider. Make sure you discuss any questions you have with your health care provider.

## 2015-04-21 ENCOUNTER — Inpatient Hospital Stay (HOSPITAL_COMMUNITY)
Admission: AD | Admit: 2015-04-21 | Discharge: 2015-04-22 | Disposition: A | Discharge status: Home / Self Care | Payer: Medicaid Other | Source: Ambulatory Visit | Attending: Obstetrics and Gynecology | Admitting: Obstetrics and Gynecology

## 2015-04-21 DIAGNOSIS — O9989 Other specified diseases and conditions complicating pregnancy, childbirth and the puerperium: Secondary | ICD-10-CM | POA: Diagnosis not present

## 2015-04-21 DIAGNOSIS — M545 Low back pain: Secondary | ICD-10-CM | POA: Diagnosis present

## 2015-04-21 DIAGNOSIS — M549 Dorsalgia, unspecified: Secondary | ICD-10-CM

## 2015-04-21 DIAGNOSIS — Z87891 Personal history of nicotine dependence: Secondary | ICD-10-CM | POA: Insufficient documentation

## 2015-04-21 DIAGNOSIS — Z3A34 34 weeks gestation of pregnancy: Secondary | ICD-10-CM | POA: Diagnosis not present

## 2015-04-21 DIAGNOSIS — R102 Pelvic and perineal pain: Secondary | ICD-10-CM | POA: Diagnosis not present

## 2015-04-21 DIAGNOSIS — O26899 Other specified pregnancy related conditions, unspecified trimester: Secondary | ICD-10-CM

## 2015-04-21 NOTE — MAU Note (Signed)
Cramps in lower back and pressure in pelvic area for several days. Saw doctor and told everything ok but getting worse. Denies LOF or bleeding. Some brown/yellow d/c

## 2015-04-22 ENCOUNTER — Encounter (HOSPITAL_COMMUNITY): Payer: Self-pay | Admitting: *Deleted

## 2015-04-22 LAB — URINALYSIS, ROUTINE W REFLEX MICROSCOPIC
BILIRUBIN URINE: NEGATIVE
Glucose, UA: NEGATIVE mg/dL
Hgb urine dipstick: NEGATIVE
Ketones, ur: NEGATIVE mg/dL
Leukocytes, UA: NEGATIVE
Nitrite: NEGATIVE
Protein, ur: NEGATIVE mg/dL
SPECIFIC GRAVITY, URINE: 1.01 (ref 1.005–1.030)
UROBILINOGEN UA: 0.2 mg/dL (ref 0.0–1.0)
pH: 6 (ref 5.0–8.0)

## 2015-04-22 LAB — WET PREP, GENITAL
Clue Cells Wet Prep HPF POC: NONE SEEN
Trich, Wet Prep: NONE SEEN
Yeast Wet Prep HPF POC: NONE SEEN

## 2015-04-22 MED ORDER — ACETAMINOPHEN 325 MG PO TABS: 325.0000 mg | ORAL_TABLET | Freq: Four times a day (QID) | ORAL | Status: DC | PRN

## 2015-04-22 MED ORDER — ACETAMINOPHEN 500 MG PO TABS
1000.0000 mg | ORAL_TABLET | Freq: Once | ORAL | Status: AC
  Administered 2015-04-22: 1000 mg via ORAL
  Filled 2015-04-22: qty 2

## 2015-04-22 NOTE — Discharge Instructions (Signed)
Braxton Hicks Contractions °Contractions of the uterus can occur throughout pregnancy. Contractions are not always a sign that you are in labor.  °WHAT ARE BRAXTON HICKS CONTRACTIONS?  °Contractions that occur before labor are called Braxton Hicks contractions, or false labor. Toward the end of pregnancy (32-34 weeks), these contractions can develop more often and may become more forceful. This is not true labor because these contractions do not result in opening (dilatation) and thinning of the cervix. They are sometimes difficult to tell apart from true labor because these contractions can be forceful and people have different pain tolerances. You should not feel embarrassed if you go to the hospital with false labor. Sometimes, the only way to tell if you are in true labor is for your health care provider to look for changes in the cervix. °If there are no prenatal problems or other health problems associated with the pregnancy, it is completely safe to be sent home with false labor and await the onset of true labor. °HOW CAN YOU TELL THE DIFFERENCE BETWEEN TRUE AND FALSE LABOR? °False Labor °· The contractions of false labor are usually shorter and not as hard as those of true labor.   °· The contractions are usually irregular.   °· The contractions are often felt in the front of the lower abdomen and in the groin.   °· The contractions may go away when you walk around or change positions while lying down.   °· The contractions get weaker and are shorter lasting as time goes on.   °· The contractions do not usually become progressively stronger, regular, and closer together as with true labor.   °True Labor °· Contractions in true labor last 30-70 seconds, become very regular, usually become more intense, and increase in frequency.   °· The contractions do not go away with walking.   °· The discomfort is usually felt in the top of the uterus and spreads to the lower abdomen and low back.   °· True labor can be  determined by your health care provider with an exam. This will show that the cervix is dilating and getting thinner.   °WHAT TO REMEMBER °· Keep up with your usual exercises and follow other instructions given by your health care provider.   °· Take medicines as directed by your health care provider.   °· Keep your regular prenatal appointments.   °· Eat and drink lightly if you think you are going into labor.   °· If Braxton Hicks contractions are making you uncomfortable:   °¨ Change your position from lying down or resting to walking, or from walking to resting.   °¨ Sit and rest in a tub of warm water.   °¨ Drink 2-3 glasses of water. Dehydration may cause these contractions.   °¨ Do slow and deep breathing several times an hour.   °WHEN SHOULD I SEEK IMMEDIATE MEDICAL CARE? °Seek immediate medical care if: °· Your contractions become stronger, more regular, and closer together.   °· You have fluid leaking or gushing from your vagina.   °· You have a fever.   °· You pass blood-tinged mucus.   °· You have vaginal bleeding.   °· You have continuous abdominal pain.   °· You have low back pain that you never had before.   °· You feel your baby's head pushing down and causing pelvic pressure.   °· Your baby is not moving as much as it used to.   °Document Released: 09/16/2005 Document Revised: 09/21/2013 Document Reviewed: 06/28/2013 °[employer]® Patient Information ©2015 [employer]. This information is not intended to replace advice given to you by your health care   provider. Make sure you discuss any questions you have with your health care provider. ° °

## 2015-04-22 NOTE — MAU Provider Note (Signed)
History  24 yo G1P0 presents unannounced to MAU w/ c/o low back pain and pelvic pressure for several days. Intercourse 2 days ago. +FM. Denies leaking or bleeding.  Patient Active Problem List   Diagnosis Date Noted  . Back pain affecting pregnancy 04/21/2015  . Pelvic pain affecting pregnancy 04/21/2015    Chief Complaint  Patient presents with  . Back Pain   HPI As above OB History    Gravida Para Term Preterm AB TAB SAB Ectopic Multiple Living   1         0      Past Medical History  Diagnosis Date  . PID (acute pelvic inflammatory disease)   . BV (bacterial vaginosis)   . Migraine     Past Surgical History  Procedure Laterality Date  . No past surgeries      Family History  Problem Relation Age of Onset  . Cancer Maternal Aunt     lung cancer  . Cancer Cousin     lung  . Hypertension Mother     History  Substance Use Topics  . Smoking status: Former Smoker -- 3 years    Quit date: 12/08/2012  . Smokeless tobacco: Former Neurosurgeon    Quit date: 10/02/2014  . Alcohol Use: No     Comment: in past    Allergies: No Known Allergies  No prescriptions prior to admission    ROS  Back pain +FM -VB -LOF Physical Exam   Blood pressure 97/73, pulse 84, temperature 98.5 F (36.9 C), resp. rate 16, height  (1.6 m), weight 58.605 kg (129 lb 3.2 oz), last menstrual period 08/23/2014.  Results for orders placed or performed during the hospital encounter of 04/21/15 (from the past 72 hour(s))  Urinalysis, Routine w reflex microscopic (not at [facility])     Status: None   Collection Time: 04/21/15 11:55 PM  Result Value Ref Range   Color, Urine YELLOW YELLOW   APPearance CLEAR CLEAR   Specific Gravity, Urine 1.010 1.005 - 1.030   pH 6.0 5.0 - 8.0   Glucose, UA NEGATIVE NEGATIVE mg/dL   Hgb urine dipstick NEGATIVE NEGATIVE   Bilirubin Urine NEGATIVE NEGATIVE   Ketones, ur NEGATIVE NEGATIVE mg/dL   Protein, ur NEGATIVE NEGATIVE mg/dL   Urobilinogen, UA 0.2  0.0 - 1.0 mg/dL   Nitrite NEGATIVE NEGATIVE   Leukocytes, UA NEGATIVE NEGATIVE    Comment: MICROSCOPIC NOT DONE ON URINES WITH NEGATIVE PROTEIN, BLOOD, LEUKOCYTES, NITRITE, OR GLUCOSE <1000 mg/dL.  Wet prep, genital     Status: Abnormal   Collection Time: 04/22/15 12:42 AM  Result Value Ref Range   Yeast Wet Prep HPF POC NONE SEEN NONE SEEN   Trich, Wet Prep NONE SEEN NONE SEEN   Clue Cells Wet Prep HPF POC NONE SEEN NONE SEEN   WBC, Wet Prep HPF POC FEW (A) NONE SEEN    Comment: MODERATE BACTERIA SEEN     Physical Exam Gen: NAD Abdomen: soft, NTND, +BS Back: Neg CVAT bilaterally Pelvic: Cvx closed/thick/high, small amount of white discharge - no odor - sample collected for wet prep and GC/CT Ext: WNL FHRT: Cat 1 U/C's: Occasional ED Course  Assessment: Common discomforts of pregnancy. Pain relieved w/ 1 gram of Tylenol. Neg UA and wet prep.  Plan: Script for Tylenol PTL precautions Pelvic rest - advised to refrain from intercourse or use condom until term Kick counts reviewed OB f/u as scheduled   Sherre Scarlet CNM, MS 04/24/2015 10:34 AM

## 2015-04-24 LAB — GC/CHLAMYDIA PROBE AMP (~~LOC~~) NOT AT ARMC
Chlamydia: NEGATIVE
Neisseria Gonorrhea: NEGATIVE

## 2015-05-01 LAB — OB RESULTS CONSOLE GBS: GBS: NEGATIVE

## 2015-05-20 ENCOUNTER — Inpatient Hospital Stay (HOSPITAL_COMMUNITY)
Admission: AD | Admit: 2015-05-20 | Discharge: 2015-05-20 | Disposition: A | Discharge status: Home / Self Care | Payer: Medicaid Other | Source: Ambulatory Visit | Attending: Obstetrics and Gynecology | Admitting: Obstetrics and Gynecology

## 2015-05-20 ENCOUNTER — Encounter (HOSPITAL_COMMUNITY): Payer: Self-pay

## 2015-05-20 DIAGNOSIS — Z87891 Personal history of nicotine dependence: Secondary | ICD-10-CM | POA: Insufficient documentation

## 2015-05-20 DIAGNOSIS — O479 False labor, unspecified: Secondary | ICD-10-CM

## 2015-05-20 DIAGNOSIS — N898 Other specified noninflammatory disorders of vagina: Secondary | ICD-10-CM | POA: Diagnosis present

## 2015-05-20 DIAGNOSIS — G43909 Migraine, unspecified, not intractable, without status migrainosus: Secondary | ICD-10-CM

## 2015-05-20 LAB — WET PREP, GENITAL
CLUE CELLS WET PREP: NONE SEEN
Trich, Wet Prep: NONE SEEN
YEAST WET PREP: NONE SEEN

## 2015-05-20 LAB — POCT FERN TEST: POCT Fern Test: NEGATIVE

## 2015-05-20 NOTE — Discharge Instructions (Signed)
Third Trimester of Pregnancy °The third trimester is from week 29 through week 42, months 7 through 9. This trimester is when your unborn baby (fetus) is growing very fast. At the end of the ninth month, the unborn baby is about 20 inches in length. It weighs about 6-10 pounds.  °HOME CARE  °· Avoid all smoking, herbs, and alcohol. Avoid drugs not approved by your doctor. °· Only take medicine as told by your doctor. Some medicines are safe and some are not during pregnancy. °· Exercise only as told by your doctor. Stop exercising if you start having cramps. °· Eat regular, healthy meals. °· Wear a good support bra if your breasts are tender. °· Do not use hot tubs, steam rooms, or saunas. °· Wear your seat belt when driving. °· Avoid raw meat, uncooked cheese, and liter boxes and soil used by cats. °· Take your prenatal vitamins. °· Try taking medicine that helps you poop (stool softener) as needed, and if your doctor approves. Eat more fiber by eating fresh fruit, vegetables, and whole grains. Drink enough fluids to keep your pee (urine) clear or pale yellow. °· Take warm water baths (sitz baths) to soothe pain or discomfort caused by hemorrhoids. Use hemorrhoid cream if your doctor approves. °· If you have puffy, bulging veins (varicose veins), wear support hose. Raise (elevate) your feet for 15 minutes, 3-4 times a day. Limit salt in your diet. °· Avoid heavy lifting, wear low heels, and sit up straight. °· Rest with your legs raised if you have leg cramps or low back pain. °· Visit your dentist if you have not gone during your pregnancy. Use a soft toothbrush to brush your teeth. Be gentle when you floss. °· You can have sex (intercourse) unless your doctor tells you not to. °· Do not travel far distances unless you must. Only do so with your doctor's approval. °· Take prenatal classes. °· Practice driving to the hospital. °· Pack your hospital bag. °· Prepare the baby's room. °· Go to your doctor visits. °GET  HELP IF: °· You are not sure if you are in labor or if your water has broken. °· You are dizzy. °· You have mild cramps or pressure in your lower belly (abdominal). °· You have a nagging pain in your belly area. °· You continue to feel sick to your stomach (nauseous), throw up (vomit), or have watery poop (diarrhea). °· You have bad smelling fluid coming from your vagina. °· You have pain with peeing (urination). °GET HELP RIGHT AWAY IF:  °· You have a fever. °· You are leaking fluid from your vagina. °· You are spotting or bleeding from your vagina. °· You have severe belly cramping or pain. °· You lose or gain weight rapidly. °· You have trouble catching your breath and have chest pain. °· You notice sudden or extreme puffiness (swelling) of your face, hands, ankles, feet, or legs. °· You have not felt the baby move in over an hour. °· You have severe headaches that do not go away with medicine. °· You have vision changes. °Document Released: 12/11/2009 Document Revised: 01/11/2013 Document Reviewed: 11/17/2012 °ExitCare® Patient Information ©2015 [employer]. This information is not intended to replace advice given to you by your health care provider. Make sure you discuss any questions you have with your health care provider. °Fetal Movement Counts °Patient Name: __________________________________________________ Patient Due Date: ____________________ °Performing a fetal movement count is highly recommended in high-risk pregnancies, but it is good   for every pregnant woman to do. Your health care provider may ask you to start counting fetal movements at 28 weeks of the pregnancy. Fetal movements often increase:  After eating a full meal.  After physical activity.  After eating or drinking something sweet or cold.  At rest. Pay attention to when you feel the baby is most active. This will help you notice a pattern of your baby's sleep and wake cycles and what factors contribute to an increase in fetal  movement. It is important to perform a fetal movement count at the same time each day when your baby is normally most active.  HOW TO COUNT FETAL MOVEMENTS  Find a quiet and comfortable area to sit or lie down on your left side. Lying on your left side provides the best blood and oxygen circulation to your baby.  Write down the day and time on a sheet of paper or in a journal.  Start counting kicks, flutters, swishes, rolls, or jabs in a 2-hour period. You should feel at least 10 movements within 2 hours.  If you do not feel 10 movements in 2 hours, wait 2-3 hours and count again. Look for a change in the pattern or not enough counts in 2 hours. SEEK MEDICAL CARE IF:  You feel less than 10 counts in 2 hours, tried twice.  There is no movement in over an hour.  The pattern is changing or taking longer each day to reach 10 counts in 2 hours.  You feel the baby is not moving as he or she usually does. Date: ____________ Movements: ____________ Start time: ____________ Elizebeth Koller time: ____________  Date: ____________ Movements: ____________ Start time: ____________ Elizebeth Koller time: ____________ Date: ____________ Movements: ____________ Start time: ____________ Elizebeth Koller time: ____________ Date: ____________ Movements: ____________ Start time: ____________ Elizebeth Koller time: ____________ Date: ____________ Movements: ____________ Start time: ____________ Elizebeth Koller time: ____________ Date: ____________ Movements: ____________ Start time: ____________ Elizebeth Koller time: ____________ Date: ____________ Movements: ____________ Start time: ____________ Elizebeth Koller time: ____________ Date: ____________ Movements: ____________ Start time: ____________ Elizebeth Koller time: ____________  Date: ____________ Movements: ____________ Start time: ____________ Elizebeth Koller time: ____________ Date: ____________ Movements: ____________ Start time: ____________ Elizebeth Koller time: ____________ Date: ____________ Movements: ____________ Start time:  ____________ Elizebeth Koller time: ____________ Date: ____________ Movements: ____________ Start time: ____________ Elizebeth Koller time: ____________ Date: ____________ Movements: ____________ Start time: ____________ Elizebeth Koller time: ____________ Date: ____________ Movements: ____________ Start time: ____________ Elizebeth Koller time: ____________ Date: ____________ Movements: ____________ Start time: ____________ Elizebeth Koller time: ____________  Date: ____________ Movements: ____________ Start time: ____________ Elizebeth Koller time: ____________ Date: ____________ Movements: ____________ Start time: ____________ Elizebeth Koller time: ____________ Date: ____________ Movements: ____________ Start time: ____________ Elizebeth Koller time: ____________ Date: ____________ Movements: ____________ Start time: ____________ Elizebeth Koller time: ____________ Date: ____________ Movements: ____________ Start time: ____________ Elizebeth Koller time: ____________ Date: ____________ Movements: ____________ Start time: ____________ Elizebeth Koller time: ____________ Date: ____________ Movements: ____________ Start time: ____________ Elizebeth Koller time: ____________  Date: ____________ Movements: ____________ Start time: ____________ Elizebeth Koller time: ____________ Date: ____________ Movements: ____________ Start time: ____________ Elizebeth Koller time: ____________ Date: ____________ Movements: ____________ Start time: ____________ Elizebeth Koller time: ____________ Date: ____________ Movements: ____________ Start time: ____________ Elizebeth Koller time: ____________ Date: ____________ Movements: ____________ Start time: ____________ Elizebeth Koller time: ____________ Date: ____________ Movements: ____________ Start time: ____________ Elizebeth Koller time: ____________ Date: ____________ Movements: ____________ Start time: ____________ Elizebeth Koller time: ____________  Date: ____________ Movements: ____________ Start time: ____________ Elizebeth Koller time: ____________ Date: ____________ Movements: ____________ Start time: ____________ Elizebeth Koller time: ____________ Date:  ____________ Movements: ____________ Start time: ____________ Elizebeth Koller time: ____________ Date: ____________  Movements: ____________ Start time: ____________ Finish time: ____________ °Date: ____________ Movements: ____________ Start time: ____________ Finish time: ____________ °Date: ____________ Movements: ____________ Start time: ____________ Finish time: ____________ °Date: ____________ Movements: ____________ Start time: ____________ Finish time: ____________  °Date: ____________ Movements: ____________ Start time: ____________ Finish time: ____________ °Date: ____________ Movements: ____________ Start time: ____________ Finish time: ____________ °Date: ____________ Movements: ____________ Start time: ____________ Finish time: ____________ °Date: ____________ Movements: ____________ Start time: ____________ Finish time: ____________ °Date: ____________ Movements: ____________ Start time: ____________ Finish time: ____________ °Date: ____________ Movements: ____________ Start time: ____________ Finish time: ____________ °Date: ____________ Movements: ____________ Start time: ____________ Finish time: ____________  °Date: ____________ Movements: ____________ Start time: ____________ Finish time: ____________ °Date: ____________ Movements: ____________ Start time: ____________ Finish time: ____________ °Date: ____________ Movements: ____________ Start time: ____________ Finish time: ____________ °Date: ____________ Movements: ____________ Start time: ____________ Finish time: ____________ °Date: ____________ Movements: ____________ Start time: ____________ Finish time: ____________ °Date: ____________ Movements: ____________ Start time: ____________ Finish time: ____________ °Date: ____________ Movements: ____________ Start time: ____________ Finish time: ____________  °Date: ____________ Movements: ____________ Start time: ____________ Finish time: ____________ °Date: ____________ Movements: ____________ Start  time: ____________ Finish time: ____________ °Date: ____________ Movements: ____________ Start time: ____________ Finish time: ____________ °Date: ____________ Movements: ____________ Start time: ____________ Finish time: ____________ °Date: ____________ Movements: ____________ Start time: ____________ Finish time: ____________ °Date: ____________ Movements: ____________ Start time: ____________ Finish time: ____________ °Document Released: 10/16/2006 Document Revised: 01/31/2014 Document Reviewed: 07/13/2012 °ExitCare® Patient Information ©2015 [employer]. This information is not intended to replace advice given to you by your health care provider. Make sure you discuss any questions you have with your health care provider. °Braxton Hicks Contractions °Contractions of the uterus can occur throughout pregnancy. Contractions are not always a sign that you are in labor.  °WHAT ARE BRAXTON HICKS CONTRACTIONS?  °Contractions that occur before labor are called Braxton Hicks contractions, or false labor. Toward the end of pregnancy (32-34 weeks), these contractions can develop more often and may become more forceful. This is not true labor because these contractions do not result in opening (dilatation) and thinning of the cervix. They are sometimes difficult to tell apart from true labor because these contractions can be forceful and people have different pain tolerances. You should not feel embarrassed if you go to the hospital with false labor. Sometimes, the only way to tell if you are in true labor is for your health care provider to look for changes in the cervix. °If there are no prenatal problems or other health problems associated with the pregnancy, it is completely safe to be sent home with false labor and await the onset of true labor. °HOW CAN YOU TELL THE DIFFERENCE BETWEEN TRUE AND FALSE LABOR? °False Labor °· The contractions of false labor are usually shorter and not as hard as those of true labor.    °· The contractions are usually irregular.   °· The contractions are often felt in the front of the lower abdomen and in the groin.   °· The contractions may go away when you walk around or change positions while lying down.   °· The contractions get weaker and are shorter lasting as time goes on.   °· The contractions do not usually become progressively stronger, regular, and closer together as with true labor.   °True Labor °· Contractions in true labor last 30-70 seconds, become very regular, usually become more intense, and increase in frequency.   °· The contractions do not go away with walking.   °· The discomfort   is usually felt in the top of the uterus and spreads to the lower abdomen and low back.   True labor can be determined by your health care provider with an exam. This will show that the cervix is dilating and getting thinner.  WHAT TO REMEMBER  Keep up with your usual exercises and follow other instructions given by your health care provider.   Take medicines as directed by your health care provider.   Keep your regular prenatal appointments.   Eat and drink lightly if you think you are going into labor.   If Braxton Hicks contractions are making you uncomfortable:   Change your position from lying down or resting to walking, or from walking to resting.   Sit and rest in a tub of warm water.   Drink 2-3 glasses of water. Dehydration may cause these contractions.   Do slow and deep breathing several times an hour.  WHEN SHOULD I SEEK IMMEDIATE MEDICAL CARE? Seek immediate medical care if:  Your contractions become stronger, more regular, and closer together.   You have fluid leaking or gushing from your vagina.   You have a fever.   You pass blood-tinged mucus.   You have vaginal bleeding.   You have continuous abdominal pain.   You have low back pain that you never had before.   You feel your baby's head pushing down and causing pelvic  pressure.   Your baby is not moving as much as it used to.  Document Released: 09/16/2005 Document Revised: 09/21/2013 Document Reviewed: 06/28/2013 [facility] LPExit[facility] Patient Information 2015 [employer] [place]ExitCare, [place]. This information is not intended to replace advice given to you by your health care provider. Make sure you discuss any questions you have with your health care provider.

## 2015-05-20 NOTE — MAU Note (Signed)
2cm in office on Wednesday.  Contractions every 3-4 min apart.  Started at 11 pm.  Having mixture of fluid and mucus for a few weeks.  No bleeding. Baby moving well.

## 2015-05-20 NOTE — MAU Provider Note (Signed)
  History  24 yo G1P0 @ 39.0 wks presents to MAU w/ c/o q 3-4 min ctxs w/ change in vaginal discharge since 11 pm last evening. +FM. Denies VB.   Patient Active Problem List   Diagnosis Date Noted  . Irregular uterine contractions 05/20/2015  . Migraine headache 05/20/2015    Chief Complaint  Patient presents with  . Contractions   HPI As above OB History    Gravida Para Term Preterm AB TAB SAB Ectopic Multiple Living   1         0      Past Medical History  Diagnosis Date  . PID (acute pelvic inflammatory disease)   . BV (bacterial vaginosis)   . Migraine     Past Surgical History  Procedure Laterality Date  . No past surgeries      Family History  Problem Relation Age of Onset  . Cancer Maternal Aunt     lung cancer  . Cancer Cousin     lung  . Hypertension Mother     Social History  Substance Use Topics  . Smoking status: Former Smoker -- 3 years    Quit date: 12/08/2012  . Smokeless tobacco: Former Neurosurgeon    Quit date: 10/02/2014  . Alcohol Use: No     Comment: in past    Allergies: No Known Allergies  No prescriptions prior to admission    ROS  As noted in HPI Physical Exam   Blood pressure 98/71, pulse 84, temperature 98 F (36.7 C), temperature source Oral, resp. rate 16, last menstrual period 08/23/2014, SpO2 100 %.  Results for orders placed or performed during the hospital encounter of 05/20/15 (from the past 48 hour(s))  Wet prep, genital     Status: Abnormal   Collection Time: 05/20/15  3:36 AM  Result Value Ref Range   Yeast Wet Prep HPF POC NONE SEEN NONE SEEN   Trich, Wet Prep NONE SEEN NONE SEEN   Clue Cells Wet Prep HPF POC NONE SEEN NONE SEEN   WBC, Wet Prep HPF POC FEW (A) NONE SEEN    Comment: MODERATE BACTERIA SEEN  Fern Test     Status: None   Collection Time: 05/20/15  3:49 AM  Result Value Ref Range   POCT Fern Test Negative = intact amniotic membranes      Physical Exam Gen: NAD Abdomen: gravid, soft, NT, no  guarding or rebound SSE: discharge thick and white -- c/w leukorrhea - wet prep & GC/CT obtained. Neg pooling, neg valsalva, neg fern. Pelvic: Cvx FT/thick/high/posterior FHRT: BL 140 w/ moderate variability, +accels, no decels Ctxs: Rare ED Course  Assessment: Not in labor No evidence of ROM Cat 1 FHRT  Plan: Labor precautions OB f/u as previously scheduled Call w/ questions/concerns   Sherre Scarlet CNM, MS 05/20/15, 03:27 AM

## 2015-05-25 ENCOUNTER — Inpatient Hospital Stay (HOSPITAL_COMMUNITY)
Admission: AD | Admit: 2015-05-25 | Discharge: 2015-05-27 | DRG: 775 | Disposition: A | Discharge status: Home / Self Care | Payer: Medicaid Other | Source: Ambulatory Visit | Attending: Obstetrics and Gynecology | Admitting: Obstetrics and Gynecology

## 2015-05-25 ENCOUNTER — Encounter (HOSPITAL_COMMUNITY): Payer: Self-pay | Admitting: *Deleted

## 2015-05-25 ENCOUNTER — Inpatient Hospital Stay (HOSPITAL_COMMUNITY): Payer: Medicaid Other | Admitting: Anesthesiology

## 2015-05-25 DIAGNOSIS — Z8249 Family history of ischemic heart disease and other diseases of the circulatory system: Secondary | ICD-10-CM

## 2015-05-25 DIAGNOSIS — Z809 Family history of malignant neoplasm, unspecified: Secondary | ICD-10-CM | POA: Diagnosis not present

## 2015-05-25 DIAGNOSIS — Z87891 Personal history of nicotine dependence: Secondary | ICD-10-CM

## 2015-05-25 DIAGNOSIS — Z3A39 39 weeks gestation of pregnancy: Secondary | ICD-10-CM | POA: Diagnosis present

## 2015-05-25 DIAGNOSIS — G43909 Migraine, unspecified, not intractable, without status migrainosus: Secondary | ICD-10-CM | POA: Diagnosis present

## 2015-05-25 LAB — CBC
HEMATOCRIT: 34.4 % — AB (ref 36.0–46.0)
Hemoglobin: 11.5 g/dL — ABNORMAL LOW (ref 12.0–15.0)
MCH: 28.5 pg (ref 26.0–34.0)
MCHC: 33.4 g/dL (ref 30.0–36.0)
MCV: 85.1 fL (ref 78.0–100.0)
Platelets: 324 10*3/uL (ref 150–400)
RBC: 4.04 MIL/uL (ref 3.87–5.11)
RDW: 14 % (ref 11.5–15.5)
WBC: 16.2 10*3/uL — ABNORMAL HIGH (ref 4.0–10.5)

## 2015-05-25 LAB — TYPE AND SCREEN
ABO/RH(D): B POS
Antibody Screen: NEGATIVE

## 2015-05-25 LAB — ABO/RH: ABO/RH(D): B POS

## 2015-05-25 LAB — RPR: RPR Ser Ql: NONREACTIVE

## 2015-05-25 LAB — HIV ANTIBODY (ROUTINE TESTING W REFLEX): HIV Screen 4th Generation wRfx: NONREACTIVE

## 2015-05-25 MED ORDER — PRENATAL MULTIVITAMIN CH
1.0000 | ORAL_TABLET | Freq: Every day | ORAL | Status: DC
  Administered 2015-05-26 – 2015-05-27 (×2): 1 via ORAL
  Filled 2015-05-25 (×2): qty 1

## 2015-05-25 MED ORDER — CITRIC ACID-SODIUM CITRATE 334-500 MG/5ML PO SOLN: 30.0000 mL | ORAL | Status: DC | PRN

## 2015-05-25 MED ORDER — ACETAMINOPHEN 325 MG PO TABS
650.0000 mg | ORAL_TABLET | ORAL | Status: DC | PRN
  Administered 2015-05-26: 650 mg via ORAL
  Filled 2015-05-25: qty 2

## 2015-05-25 MED ORDER — FENTANYL CITRATE (PF) 100 MCG/2ML IJ SOLN
100.0000 ug | INTRAMUSCULAR | Status: DC | PRN
  Administered 2015-05-25 (×2): 100 ug via INTRAVENOUS
  Filled 2015-05-25 (×2): qty 2

## 2015-05-25 MED ORDER — WITCH HAZEL-GLYCERIN EX PADS
1.0000 "application " | MEDICATED_PAD | CUTANEOUS | Status: DC | PRN
  Administered 2015-05-26: 1 via TOPICAL

## 2015-05-25 MED ORDER — FLEET ENEMA 7-19 GM/118ML RE ENEM: 1.0000 | ENEMA | RECTAL | Status: DC | PRN

## 2015-05-25 MED ORDER — DIPHENHYDRAMINE HCL 50 MG/ML IJ SOLN: 12.5000 mg | INTRAMUSCULAR | Status: DC | PRN

## 2015-05-25 MED ORDER — LACTATED RINGERS IV SOLN
INTRAVENOUS | Status: DC
  Administered 2015-05-25: 11:00:00 via INTRAVENOUS

## 2015-05-25 MED ORDER — ACETAMINOPHEN 325 MG PO TABS: 650.0000 mg | ORAL_TABLET | ORAL | Status: DC | PRN

## 2015-05-25 MED ORDER — OXYCODONE-ACETAMINOPHEN 5-325 MG PO TABS
1.0000 | ORAL_TABLET | ORAL | Status: DC | PRN
  Administered 2015-05-25 – 2015-05-27 (×4): 1 via ORAL
  Filled 2015-05-25 (×4): qty 1

## 2015-05-25 MED ORDER — LANOLIN HYDROUS EX OINT: TOPICAL_OINTMENT | CUTANEOUS | Status: DC | PRN

## 2015-05-25 MED ORDER — OXYTOCIN BOLUS FROM INFUSION
500.0000 mL | INTRAVENOUS | Status: DC
  Administered 2015-05-25: 500 mL via INTRAVENOUS

## 2015-05-25 MED ORDER — LIDOCAINE HCL (PF) 1 % IJ SOLN
30.0000 mL | INTRAMUSCULAR | Status: DC | PRN
  Filled 2015-05-25 (×2): qty 30

## 2015-05-25 MED ORDER — LACTATED RINGERS IV SOLN
500.0000 mL | INTRAVENOUS | Status: DC | PRN
  Administered 2015-05-25 (×2): 500 mL via INTRAVENOUS

## 2015-05-25 MED ORDER — ZOLPIDEM TARTRATE 5 MG PO TABS: 5.0000 mg | ORAL_TABLET | Freq: Every evening | ORAL | Status: DC | PRN

## 2015-05-25 MED ORDER — LIDOCAINE HCL (PF) 1 % IJ SOLN
INTRAMUSCULAR | Status: DC | PRN
  Administered 2015-05-25 (×2): 4 mL via EPIDURAL

## 2015-05-25 MED ORDER — IBUPROFEN 600 MG PO TABS
600.0000 mg | ORAL_TABLET | Freq: Four times a day (QID) | ORAL | Status: DC
  Administered 2015-05-25 – 2015-05-27 (×9): 600 mg via ORAL
  Filled 2015-05-25 (×8): qty 1

## 2015-05-25 MED ORDER — FENTANYL 2.5 MCG/ML BUPIVACAINE 1/10 % EPIDURAL INFUSION (WH - ANES)
INTRAMUSCULAR | Status: AC
  Filled 2015-05-25: qty 125

## 2015-05-25 MED ORDER — OXYCODONE-ACETAMINOPHEN 5-325 MG PO TABS: 1.0000 | ORAL_TABLET | ORAL | Status: DC | PRN

## 2015-05-25 MED ORDER — SENNOSIDES-DOCUSATE SODIUM 8.6-50 MG PO TABS
2.0000 | ORAL_TABLET | ORAL | Status: DC
  Administered 2015-05-25 – 2015-05-26 (×2): 2 via ORAL
  Filled 2015-05-25 (×2): qty 2

## 2015-05-25 MED ORDER — SIMETHICONE 80 MG PO CHEW: 80.0000 mg | CHEWABLE_TABLET | ORAL | Status: DC | PRN

## 2015-05-25 MED ORDER — FENTANYL 2.5 MCG/ML BUPIVACAINE 1/10 % EPIDURAL INFUSION (WH - ANES)
14.0000 mL/h | INTRAMUSCULAR | Status: DC | PRN
  Administered 2015-05-25: 14 mL/h via EPIDURAL

## 2015-05-25 MED ORDER — ONDANSETRON HCL 4 MG PO TABS: 4.0000 mg | ORAL_TABLET | ORAL | Status: DC | PRN

## 2015-05-25 MED ORDER — ONDANSETRON HCL 4 MG/2ML IJ SOLN: 4.0000 mg | Freq: Four times a day (QID) | INTRAMUSCULAR | Status: DC | PRN

## 2015-05-25 MED ORDER — DIBUCAINE 1 % RE OINT
1.0000 | TOPICAL_OINTMENT | RECTAL | Status: DC | PRN
Start: 2015-05-25 — End: 2015-05-27
  Administered 2015-05-26: 1 via RECTAL
  Filled 2015-05-25: qty 28

## 2015-05-25 MED ORDER — TETANUS-DIPHTH-ACELL PERTUSSIS 5-2.5-18.5 LF-MCG/0.5 IM SUSP
0.5000 mL | Freq: Once | INTRAMUSCULAR | Status: AC
  Administered 2015-05-26: 0.5 mL via INTRAMUSCULAR
  Filled 2015-05-25: qty 0.5

## 2015-05-25 MED ORDER — MEDROXYPROGESTERONE ACETATE 150 MG/ML IM SUSP: 150.0000 mg | INTRAMUSCULAR | Status: DC | PRN

## 2015-05-25 MED ORDER — DIPHENHYDRAMINE HCL 25 MG PO CAPS: 25.0000 mg | ORAL_CAPSULE | Freq: Four times a day (QID) | ORAL | Status: DC | PRN

## 2015-05-25 MED ORDER — EPHEDRINE 5 MG/ML INJ
10.0000 mg | INTRAVENOUS | Status: DC | PRN
  Filled 2015-05-25: qty 2

## 2015-05-25 MED ORDER — ONDANSETRON HCL 4 MG/2ML IJ SOLN
4.0000 mg | INTRAMUSCULAR | Status: DC | PRN
Start: 2015-05-25 — End: 2015-05-27

## 2015-05-25 MED ORDER — OXYCODONE-ACETAMINOPHEN 5-325 MG PO TABS: 2.0000 | ORAL_TABLET | ORAL | Status: DC | PRN

## 2015-05-25 MED ORDER — PHENYLEPHRINE 40 MCG/ML (10ML) SYRINGE FOR IV PUSH (FOR BLOOD PRESSURE SUPPORT)
80.0000 ug | PREFILLED_SYRINGE | INTRAVENOUS | Status: DC | PRN
  Filled 2015-05-25: qty 2

## 2015-05-25 MED ORDER — FENTANYL 2.5 MCG/ML BUPIVACAINE 1/10 % EPIDURAL INFUSION (WH - ANES): 14.0000 mL/h | INTRAMUSCULAR | Status: DC | PRN

## 2015-05-25 MED ORDER — PHENYLEPHRINE 40 MCG/ML (10ML) SYRINGE FOR IV PUSH (FOR BLOOD PRESSURE SUPPORT)
PREFILLED_SYRINGE | INTRAVENOUS | Status: AC
  Filled 2015-05-25: qty 20

## 2015-05-25 MED ORDER — OXYTOCIN 40 UNITS IN LACTATED RINGERS INFUSION - SIMPLE MED
62.5000 mL/h | INTRAVENOUS | Status: DC
  Filled 2015-05-25: qty 1000

## 2015-05-25 MED ORDER — EPHEDRINE 5 MG/ML INJ: 10.0000 mg | INTRAVENOUS | Status: DC | PRN

## 2015-05-25 MED ORDER — BENZOCAINE-MENTHOL 20-0.5 % EX AERO
1.0000 "application " | INHALATION_SPRAY | CUTANEOUS | Status: DC | PRN
  Administered 2015-05-26 – 2015-05-27 (×2): 1 via TOPICAL
  Filled 2015-05-25 (×2): qty 56

## 2015-05-25 MED ORDER — PHENYLEPHRINE 40 MCG/ML (10ML) SYRINGE FOR IV PUSH (FOR BLOOD PRESSURE SUPPORT): 80.0000 ug | PREFILLED_SYRINGE | INTRAVENOUS | Status: DC | PRN

## 2015-05-25 NOTE — Progress Notes (Signed)
  Subjective: Now on Birthing Suite--family at bedside.  Received Fentanyl at 0440 with benefit.  Has elected to defer epidural at present.  Objective: BP 101/76 mmHg  Pulse 90  Temp(Src) 98.2 F (36.8 C) (Oral)  Resp 18  Ht  (1.6 m)  Wt 60.328 kg (133 lb)  BMI 23.57 kg/m2  LMP 08/23/2014      FHT: Category 1 UC:   regular, every 3-4 minutes SVE:   Dilation: 3 Effacement (%): 90 Station: 0 Exam by:: Nigel Bridgeman CNM at [phone]   Assessment:  IUP at 39 2/7 weeks  Early labor  Plan: Offered AROM as labor augmentation--patient declined at present. Will recheck prior to 7am, with recommendation for AROM if no progress. Patient agreeable with that plan.  Nigel Bridgeman CNM 05/25/2015, 5:21 AM

## 2015-05-25 NOTE — Anesthesia Preprocedure Evaluation (Signed)
Anesthesia Evaluation  Patient identified by MRN, date of birth, ID band Patient awake    Reviewed: Allergy & Precautions, H&P , NPO status , Patient's Chart, lab work & pertinent test results, reviewed documented beta blocker date and time   Airway Mallampati: II  TM Distance: >3 FB Neck ROM: full    Dental no notable dental hx.    Pulmonary neg pulmonary ROS, former smoker,  breath sounds clear to auscultation  Pulmonary exam normal       Cardiovascular negative cardio ROS Normal cardiovascular examRhythm:regular Rate:Normal     Neuro/Psych  Headaches, negative psych ROS   GI/Hepatic negative GI ROS, Neg liver ROS,   Endo/Other  negative endocrine ROS  Renal/GU negative Renal ROS  negative genitourinary   Musculoskeletal   Abdominal   Peds  Hematology negative hematology ROS (+)   Anesthesia Other Findings Pregnancy - uncomplicated Platelets and allergies reviewed Denies active cardiac or pulmonary symptoms, METS > 4  Denies blood thinning medications, bleeding disorders, hypertension, asthma, supine hypotension syndrome, previous anesthesia difficulties    Reproductive/Obstetrics (+) Pregnancy                             Anesthesia Physical Anesthesia Plan  ASA: II  Anesthesia Plan: Epidural   Post-op Pain Management:    Induction:   Airway Management Planned:   Additional Equipment:   Intra-op Plan:   Post-operative Plan:   Informed Consent: I have reviewed the patients History and Physical, chart, labs and discussed the procedure including the risks, benefits and alternatives for the proposed anesthesia with the patient or authorized representative who has indicated his/her understanding and acceptance.     Plan Discussed with:   Anesthesia Plan Comments:         Anesthesia Quick Evaluation

## 2015-05-25 NOTE — Progress Notes (Signed)
Labor Progress  Subjective: Pt report increase pressure. Pt appears to be aggrivated with the whole labor process.  She's unsure why she is in such pain.  Labor process reviewed.  Pain management options reviewed.  Pt agreed to IV pain meds, and an epidural  Objective: BP 106/64 mmHg  Pulse 98  Temp(Src) 98.3 F (36.8 C) (Oral)  Resp 18  Ht  (1.6 m)  Wt 133 lb (60.328 kg)  BMI 23.57 kg/m2  LMP 08/23/2014 I/O last 3 completed shifts: In: -  Out: 1 [Emesis/NG output:1]   FHT:134, moderate variability, + accel, audible decel x 2.5 minutes at 0708, tracing appears to be maternal  CTX:  regular, every 3 minutes Uterus gravid, soft non tender SVE:  Dilation: 3 Effacement (%): 90 Station: 0 Exam by:: Zophia Marrone, CNM    Assessment:  IUP at 39.2 weeks NICHD: Category 2 Membranes:  AROM x 1hrs, no s/s of infection Labor progress: adquate labor GBS: negative FSE  Plan: Continue labor plan Continuous monitoring Epidural per pt request Will reassess with cervical exam at 1100 or earlier if necessary       Suella Cogar, CNM, MSN 05/25/2015. 7:49 AM

## 2015-05-25 NOTE — Anesthesia Procedure Notes (Signed)
Epidural Patient location during procedure: OB  Staffing Anesthesiologist: Maryann Mccall Performed by: anesthesiologist   Preanesthetic Checklist Completed: patient identified, site marked, surgical consent, pre-op evaluation, timeout performed, IV checked, risks and benefits discussed and monitors and equipment checked  Epidural Patient position: sitting Prep: site prepped and draped and DuraPrep Patient monitoring: continuous pulse ox and blood pressure Approach: midline Location: L3-L4 Injection technique: LOR saline  Needle:  Needle type: Tuohy  Needle gauge: 17 G Needle length: 9 cm and 9 Needle insertion depth: 5 cm cm Catheter type: closed end flexible Catheter size: 19 Gauge Catheter at skin depth: 9 cm Test dose: negative  Assessment Events: blood not aspirated, injection not painful, no injection resistance, negative IV test and no paresthesia  Additional Notes Patient identified. Risks/Benefits/Options discussed with patient including but not limited to bleeding, infection, nerve damage, paralysis, failed block, incomplete pain control, headache, blood pressure changes, nausea, vomiting, reactions to medications, itching and postpartum back pain. Confirmed with bedside nurse the patient's most recent platelet count. Confirmed with patient that they are not currently taking any anticoagulation, have any bleeding history or any family history of bleeding disorders. Patient expressed understanding and wished to proceed. All questions were answered. Sterile technique was used throughout the entire procedure. Please see nursing notes for vital signs. Test dose was given through epidural catheter and negative prior to continuing to dose epidural or start infusion. Warning signs of high block given to the patient including shortness of breath, tingling/numbness in hands, complete motor block, or any concerning symptoms with instructions to call for help. Patient was given  instructions on fall risk and not to get out of bed. All questions and concerns addressed with instructions to call with any issues or inadequate analgesia.      
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
## 2015-05-25 NOTE — Progress Notes (Signed)
  Subjective: Breathing with UCs.  Family at [facility].  Vomited x 2.  Objective: BP 106/64 mmHg  Pulse 98  Temp(Src) 98.3 F (36.8 C) (Oral)  Resp 18  Ht  (1.6 m)  Wt 60.328 kg (133 lb)  BMI 23.57 kg/m2  LMP 08/23/2014   Total I/O In: -  Out: 1 [Emesis/NG output:1]  FHT: Category 1 UC:   regular, every 3 minutes SVE:   Dilation: 3 Effacement (%): 90 Station: 0 Exam by:: Emilee Hero, CNM  BBOW--AROM with consent, moderate MSF   Assessment:  Early labor Moderate MSF GBS negative  Plan: Reviewed plan for continued observation of labor status, with augmentation prn. Patient currently declines epidural at this time. Declines Zofran at present.  Nigel Bridgeman CNM 05/25/2015, 6:53 AM

## 2015-05-25 NOTE — MAU Note (Signed)
Report called to [facility] on BS. Will go to room 160 after IV and labs

## 2015-05-25 NOTE — Progress Notes (Signed)
Labor Progress  Subjective: Sleep, no complaints  Objective: BP 111/75 mmHg  Pulse 80  Temp(Src) 98 F (36.7 C) (Oral)  Resp 18  Ht  (1.6 m)  Wt 133 lb (60.328 kg)  BMI 23.57 kg/m2  SpO2 100%  LMP 08/23/2014 I/O last 3 completed shifts: In: -  Out: 1 [Emesis/NG output:1]   FHT:140, moderate variability, + accel, no decels CTX:  regular, every 3 minutes Uterus gravid, soft non tender SVE:  Dilation: Lip/rim Effacement (%): 100 Station: +1 Exam by:: Sorcha Rotunno, CNM  Assessment:  IUP at 39.2 weeks NICHD: Category 1 Membranes:  AROM x 4hrs, no s/s of infection Labor progress: adquate labor GBS: negative   Plan: Continue labor plan Continuous monitoring Frequent position changes to facilitate fetal rotation and descent. Will reassess with cervical exam at 1300 or earlier if necessary       Adithi Gammon, CNM, MSN 05/25/2015. 11:05 AM

## 2015-05-25 NOTE — MAU Note (Signed)
Contractions and pressure started yesterday 1am. No leaking or bleeding at present time.

## 2015-05-26 LAB — CBC
HCT: 28.8 % — ABNORMAL LOW (ref 36.0–46.0)
Hemoglobin: 9.4 g/dL — ABNORMAL LOW (ref 12.0–15.0)
MCH: 27.9 pg (ref 26.0–34.0)
MCHC: 32.6 g/dL (ref 30.0–36.0)
MCV: 85.5 fL (ref 78.0–100.0)
PLATELETS: 244 10*3/uL (ref 150–400)
RBC: 3.37 MIL/uL — ABNORMAL LOW (ref 3.87–5.11)
RDW: 14.2 % (ref 11.5–15.5)
WBC: 19.1 10*3/uL — ABNORMAL HIGH (ref 4.0–10.5)

## 2015-05-26 MED ORDER — FERROUS SULFATE 325 (65 FE) MG PO TABS
325.0000 mg | ORAL_TABLET | Freq: Two times a day (BID) | ORAL | Status: DC
  Administered 2015-05-26 – 2015-05-27 (×3): 325 mg via ORAL
  Filled 2015-05-26 (×3): qty 1

## 2015-05-26 NOTE — Lactation Note (Signed)
This note was copied from the chart of Candice Kelcey Korus. Lactation Consultation Note  Patient Name: Candice White [id number] Date: 05/26/2015 Reason for consult: Initial assessment Assisted Mom with positioning and obtaining more depth with latch. Basic teaching reviewed, cluster feeding discussed. Lactation brochure left for review, advised of OP services and support group. Mom requested hand pump for home. Demonstrated use/cleaning. Encouraged to call for assist as needed.   Maternal Data Has patient been taught Hand Expression?: Yes Does the patient have breastfeeding experience prior to this delivery?: No  Feeding Feeding Type: Breast Fed Length of feed: 10 min  LATCH Score/Interventions Latch: Repeated attempts needed to sustain latch, nipple held in mouth throughout feeding, stimulation needed to elicit sucking reflex. Intervention(s): Adjust position;Assist with latch;Breast massage;Breast compression  Audible Swallowing: A few with stimulation  Type of Nipple: Everted at rest and after stimulation  Comfort (Breast/Nipple): Soft / non-tender     Hold (Positioning): Assistance needed to correctly position infant at breast and maintain latch. Intervention(s): Breastfeeding basics reviewed;Support Pillows;Position options;Skin to skin  LATCH Score: 7  Lactation Tools Discussed/Used WIC Program: Yes   Consult Status Consult Status: Follow-up Date: 05/27/15 Follow-up type: In-patient    Alfred Levins 05/26/2015, 3:41 PM

## 2015-05-26 NOTE — Progress Notes (Signed)
UR chart review completed.  

## 2015-05-26 NOTE — Progress Notes (Signed)
Candice White  Post Partum Day 1:S/P SVD with 2nd Degree Vaginal and SF Left Labial Lacerations  Subjective: Patient up ad lib, denies syncope or dizziness. Reports consuming regular diet without issues and denies N/V. Denies issues with urination and reports bleeding is "light."  Patient is breastfeeding.  Desires depo for postpartum contraception.  Pain is being  managed with use of motrin and percocet, but patient reports discomfort in vaginal area.  Objective: Filed Vitals:   05/25/15 1700 05/25/15 1800 05/25/15 2210 05/26/15 0539  BP: 105/64 102/63 96/62 97/58   Pulse: 98 101 102 68  Temp: 98.4 F (36.9 C) 97.5 F (36.4 C) 98 F (36.7 C) 98.2 F (36.8 C)  TempSrc: Oral Oral  Oral  Resp: Height:      Weight:      SpO2:        Recent Labs  05/25/15 0419 05/26/15 0540  HGB 11.5* 9.4*  HCT 34.4* 28.8*    Physical Exam:  General: alert, cooperative and no distress Mood/Affect: Appropriate/Bright Lungs: clear to auscultation, no wheezes, rales or rhonchi, symmetric air entry.  Heart: normal rate and regular rhythm. Breast: not examined. Abdomen:  + bowel sounds, Soft, Mild Tenderness Uterine Fundus: firm, U/ Lochia: appropriate Laceration: Healing Well Skin: Warm, Dry DVT Evaluation: Negative Homan's sign. No cords or calf tenderness. No significant calf/ankle edema.  Assessment S/P Vaginal Delivery-Day 1 Normal Involution BreastFeeding   Plan: Continue pain medication as ordered Vaginal care including peri-care bottle, tucks pads, and dermoplast spray Desires to have depo injection at 6 week appt Continue current care Dr. Kathie Rhodes. Rivard to be updated on patient status   Candice White, Candice Faster, MSN, CNM 05/26/2015, 9:29 AM

## 2015-05-26 NOTE — Anesthesia Postprocedure Evaluation (Signed)
Anesthesia Post Note  Patient: Candice White  Procedure(s) Performed: * No procedures listed *  Anesthesia type: Epidural  Patient location: Mother/Baby  Post pain: Pain level controlled  Post assessment: Post-op Vital signs reviewed  Last Vitals:  Filed Vitals:   05/26/15 0539  BP: 97/58  Pulse: 68  Temp: 36.8 C  Resp: 20    Post vital signs: Reviewed  Level of consciousness: awake  Complications: No apparent anesthesia complications

## 2015-05-27 MED ORDER — FERROUS SULFATE 325 (65 FE) MG PO TABS: 325.0000 mg | ORAL_TABLET | Freq: Two times a day (BID) | ORAL | Status: DC

## 2015-05-27 MED ORDER — MEDROXYPROGESTERONE ACETATE 150 MG/ML IM SUSP: 150.0000 mg | INTRAMUSCULAR | Status: DC

## 2015-05-27 MED ORDER — OXYCODONE-ACETAMINOPHEN 5-325 MG PO TABS: 1.0000 | ORAL_TABLET | Freq: Three times a day (TID) | ORAL | Status: DC | PRN

## 2015-05-27 MED ORDER — IBUPROFEN 600 MG PO TABS: 600.0000 mg | ORAL_TABLET | Freq: Four times a day (QID) | ORAL | Status: DC | PRN

## 2015-05-27 NOTE — Discharge Instructions (Signed)
Postpartum Care After Vaginal Delivery °After you deliver your newborn (postpartum period), the usual stay in the hospital is 24-72 hours. If there were problems with your labor or delivery, or if you have other medical problems, you might be in the hospital longer.  °While you are in the hospital, you will receive help and instructions on how to care for yourself and your newborn during the postpartum period.  °While you are in the hospital: °· Be sure to tell your nurses if you have pain or discomfort, as well as where you feel the pain and what makes the pain worse. °· If you had an incision made near your vagina (episiotomy) or if you had some tearing during delivery, the nurses may put ice packs on your episiotomy or tear. The ice packs may help to reduce the pain and swelling. °· If you are breastfeeding, you may feel uncomfortable contractions of your uterus for a couple of weeks. This is normal. The contractions help your uterus get back to normal size. °· It is normal to have some bleeding after delivery. °¨ For the first 1-3 days after delivery, the flow is red and the amount may be similar to a period. °¨ It is common for the flow to start and stop. °¨ In the first few days, you may pass some small clots. Let your nurses know if you begin to pass large clots or your flow increases. °¨ Do not  flush blood clots down the toilet before having the nurse look at them. °¨ During the next 3-10 days after delivery, your flow should become more watery and pink or brown-tinged in color. °¨ Ten to fourteen days after delivery, your flow should be a small amount of yellowish-white discharge. °¨ The amount of your flow will decrease over the first few weeks after delivery. Your flow may stop in 6-8 weeks. Most women have had their flow stop by 12 weeks after delivery. °· You should change your sanitary pads frequently. °· Wash your hands thoroughly with soap and water for at least 20 seconds after changing pads, using  the toilet, or before holding or feeding your newborn. °· You should feel like you need to empty your bladder within the first 6-8 hours after delivery. °· In case you become weak, lightheaded, or faint, call your nurse before you get out of bed for the first time and before you take a shower for the first time. °· Within the first few days after delivery, your breasts may begin to feel tender and full. This is called engorgement. Breast tenderness usually goes away within 48-72 hours after engorgement occurs. You may also notice milk leaking from your breasts. If you are not breastfeeding, do not stimulate your breasts. Breast stimulation can make your breasts produce more milk. °· Spending as much time as possible with your newborn is very important. During this time, you and your newborn can feel close and get to know each other. Having your newborn stay in your room (rooming in) will help to strengthen the bond with your newborn.  It will give you time to get to know your newborn and become comfortable caring for your newborn. °· Your hormones change after delivery. Sometimes the hormone changes can temporarily cause you to feel sad or tearful. These feelings should not last more than a few days. If these feelings last longer than that, you should talk to your caregiver. °· If desired, talk to your caregiver about methods of family planning or contraception. °·   Talk to your caregiver about immunizations. Your caregiver may want you to have the following immunizations before leaving the hospital:  Tetanus, diphtheria, and pertussis (Tdap) or tetanus and diphtheria (Td) immunization. It is very important that you and your family (including grandparents) or others caring for your newborn are up-to-date with the Tdap or Td immunizations. The Tdap or Td immunization can help protect your newborn from getting ill.  Rubella immunization.  Varicella (chickenpox) immunization.  Influenza immunization. You should  receive this annual immunization if you did not receive the immunization during your pregnancy. Document Released: 07/14/2007 Document Revised: 06/10/2012 Document Reviewed: 05/13/2012 [facility] Patient Information 2015 [place], [place]. This information is not intended to replace advice given to you by your health care provider. Make sure you discuss any questions you have with your health care provider. Medroxyprogesterone injection [Contraceptive] What is this medicine? MEDROXYPROGESTERONE (me DROX ee proe JES te rone) contraceptive injections prevent pregnancy. They provide effective birth control for 3 months. Depo-subQ Provera 104 is also used for treating pain related to endometriosis. This medicine may be used for other purposes; ask your health care provider or pharmacist if you have questions. COMMON BRAND NAME(S): Depo-Provera, Depo-subQ Provera 104 What should I tell my health care provider before I take this medicine? They need to know if you have any of these conditions: -frequently drink alcohol -asthma -blood vessel disease or a history of a blood clot in the lungs or legs -bone disease such as osteoporosis -breast cancer -diabetes -eating disorder (anorexia nervosa or bulimia) -high blood pressure -HIV infection or AIDS -kidney disease -liver disease -mental depression -migraine -seizures (convulsions) -stroke -tobacco smoker -vaginal bleeding -an unusual or allergic reaction to medroxyprogesterone, other hormones, medicines, foods, dyes, or preservatives -pregnant or trying to get pregnant -breast-feeding How should I use this medicine? Depo-Provera Contraceptive injection is given into a muscle. Depo-subQ Provera 104 injection is given under the skin. These injections are given by a health care professional. You must not be pregnant before getting an injection. The injection is usually given during the first 5 days after the start of a menstrual period or 6 weeks after  delivery of a baby. Talk to your pediatrician regarding the use of this medicine in children. Special care may be needed. These injections have been used in female children who have started having menstrual periods. Overdosage: If you think you have taken too much of this medicine contact a poison control center or emergency room at once. NOTE: This medicine is only for you. Do not share this medicine with others. What if I miss a dose? Try not to miss a dose. You must get an injection once every 3 months to maintain birth control. If you cannot keep an appointment, call and reschedule it. If you wait longer than 13 weeks between Depo-Provera contraceptive injections or longer than 14 weeks between Depo-subQ Provera 104 injections, you could get pregnant. Use another method for birth control if you miss your appointment. You may also need a pregnancy test before receiving another injection. What may interact with this medicine? Do not take this medicine with any of the following medications: -bosentan This medicine may also interact with the following medications: -aminoglutethimide -antibiotics or medicines for infections, especially rifampin, rifabutin, rifapentine, and griseofulvin -aprepitant -barbiturate medicines such as phenobarbital or primidone -bexarotene -carbamazepine -medicines for seizures like ethotoin, felbamate, oxcarbazepine, phenytoin, topiramate -modafinil -St. John's wort This list may not describe all possible interactions. Give your health care provider a list of all  the medicines, herbs, non-prescription drugs, or dietary supplements you use. Also tell them if you smoke, drink alcohol, or use illegal drugs. Some items may interact with your medicine. What should I watch for while using this medicine? This drug does not protect you against HIV infection (AIDS) or other sexually transmitted diseases. Use of this product may cause you to lose calcium from your bones. Loss  of calcium may cause weak bones (osteoporosis). Only use this product for more than 2 years if other forms of birth control are not right for you. The longer you use this product for birth control the more likely you will be at risk for weak bones. Ask your health care professional how you can keep strong bones. You may have a change in bleeding pattern or irregular periods. Many females stop having periods while taking this drug. If you have received your injections on time, your chance of being pregnant is very low. If you think you may be pregnant, see your health care professional as soon as possible. Tell your health care professional if you want to get pregnant within the next year. The effect of this medicine may last a long time after you get your last injection. What side effects may I notice from receiving this medicine? Side effects that you should report to your doctor or health care professional as soon as possible: -allergic reactions like skin rash, itching or hives, swelling of the face, lips, or tongue -breast tenderness or discharge -breathing problems -changes in vision -depression -feeling faint or lightheaded, falls -fever -pain in the abdomen, chest, groin, or leg -problems with balance, talking, walking -unusually weak or tired -yellowing of the eyes or skin Side effects that usually do not require medical attention (report to your doctor or health care professional if they continue or are bothersome): -acne -fluid retention and swelling -headache -irregular periods, spotting, or absent periods -temporary pain, itching, or skin reaction at site where injected -weight gain This list may not describe all possible side effects. Call your doctor for medical advice about side effects. You may report side effects to FDA at 1-800-FDA-1088. Where should I keep my medicine? This does not apply. The injection will be given to you by a health care professional. NOTE: This sheet  is a summary. It may not cover all possible information. If you have questions about this medicine, talk to your doctor, pharmacist, or health care provider.  2015, Elsevier/Gold Standard. (2008-10-07 18:37:56) Iron-Rich Diet An iron-rich diet contains foods that are good sources of iron. Iron is an important mineral that helps your body produce hemoglobin. Hemoglobin is a protein in red blood cells that carries oxygen to the body's tissues. Sometimes, the iron level in your blood can be low. This may be caused by:  A lack of iron in your diet.  Blood loss.  Times of growth, such as during pregnancy or during a child's growth and development. Low levels of iron can cause a decrease in the number of red blood cells. This can result in iron deficiency anemia. Iron deficiency anemia symptoms include:  Tiredness.  Weakness.  Irritability.  Increased chance of infection. Here are some recommendations for daily iron intake:  Males older than 23 years of age need 8 mg of iron per day.  Women ages 38 to 41 need 18 mg of iron per day.  Pregnant women need 27 mg of iron per day, and women who are over 72 years of age and breastfeeding need 9 mg  of iron per day.  Women over the age of 33 need 8 mg of iron per day. SOURCES OF IRON There are 2 types of iron that are found in food: heme iron and nonheme iron. Heme iron is absorbed by the body better than nonheme iron. Heme iron is found in meat, poultry, and fish. Nonheme iron is found in grains, beans, and vegetables. Heme Iron Sources Food / Iron (mg)  Chicken liver, 3 oz (85 g)/ 10 mg  Beef liver, 3 oz (85 g)/ 5.5 mg  Oysters, 3 oz (85 g)/ 8 mg  Beef, 3 oz (85 g)/ 2 to 3 mg  Shrimp, 3 oz (85 g)/ 2.8 mg  [place], 3 oz (85 g)/ 2 mg  Chicken, 3 oz (85 g) / 1 mg  Fish (tuna, halibut), 3 oz (85 g)/ 1 mg  Pork, 3 oz (85 g)/ 0.9 mg Nonheme Iron Sources Food / Iron (mg)  Ready-to-eat breakfast cereal, iron-fortified / 3.9 to 7  mg  Tofu,  cup / 3.4 mg  Kidney beans,  cup / 2.6 mg  Baked potato with skin / 2.7 mg  Asparagus,  cup / 2.2 mg  Avocado / 2 mg  Dried peaches,  cup / 1.6 mg  Raisins,  cup / 1.5 mg  Soy milk, 1 cup / 1.5 mg  Whole-wheat bread, 1 slice / 1.2 mg  Spinach, 1 cup / 0.8 mg  Broccoli,  cup / 0.6 mg IRON ABSORPTION Certain foods can decrease the body's absorption of iron. Try to avoid these foods and beverages while eating meals with iron-containing foods:  Coffee.  Tea.  Fiber.  Soy. Foods containing vitamin C can help increase the amount of iron your body absorbs from iron sources, especially from nonheme sources. Eat foods with vitamin C along with iron-containing foods to increase your iron absorption. Foods that are high in vitamin C include many fruits and vegetables. Some good sources are:  Fresh orange juice.  Oranges.  Strawberries.  Mangoes.  Grapefruit.  Red bell peppers.  Green bell peppers.  Broccoli.  Potatoes with skin.  Tomato juice. Document Released: 04/30/2005 Document Revised: 12/09/2011 Document Reviewed: 03/07/2011 [facility] Patient Information 2015 [place], [place]. This information is not intended to replace advice given to you by your health care provider. Make sure you discuss any questions you have with your health care provider.

## 2015-05-27 NOTE — Lactation Note (Signed)
This note was copied from the chart of Candice Lovell Roe. Lactation Consultation Note Mom reports that breastfeeding is going well and that baby latches easily. Baby was showing feeing cues and mom latched baby to left side in laid back position. Infant did latch easily but noted to have shallow latch and dimpling of cheeks. Assisted mom in positioning in football hold and baby noted to have deeper latch and dimpling. Audible swallows heard and baby content after 10 minutes of feeding.  Breast noted to be filling , encouraged to express with hand pump as needed. Aware of support group, outpatient services and expression and storage of milk.  Patient Name: Candice White [id number] Date: 05/27/2015 Reason for consult: Follow-up assessment   Maternal Data    Feeding Feeding Type: Breast Fed Length of feed: 10 min  LATCH Score/Interventions Latch: Grasps breast easily, tongue down, lips flanged, rhythmical sucking.  Audible Swallowing: A few with stimulation  Type of Nipple: Everted at rest and after stimulation  Comfort (Breast/Nipple): Soft / non-tender     Hold (Positioning): Assistance needed to correctly position infant at breast and maintain latch.  LATCH Score: 8  Lactation Tools Discussed/Used     Consult Status      Candice White 05/27/2015, 4:17 PM

## 2015-05-27 NOTE — Discharge Summary (Signed)
Vaginal Delivery Discharge Summary  Candice White  DOB:    1991-08-31 MRN:    [id number] CSN:    [id number]  Date of admission:                  May 25, 2015  Date of discharge:                   May 27, 2015  Procedures this admission:   SVD with Repair of 2nd degree vaginal and left labial lacerations  Date of Delivery: May 25, 2015  Newborn Data:  Live born female  Birth Weight: 7 lb 3.7 oz (3280 g) APGAR: 8, 9  Home with mother. Name: Sya Circumcision Plan: Outpt at [facility]  History of Present Illness:  Candice White is a 24 y.o. female, G1P1001, who presents at [redacted]w[redacted]d weeks gestation. The patient has been followed at [facility]cology division of [facility]. She was admitted onset of labor. Her pregnancy has been complicated by:  Patient Active Problem List   Diagnosis Date Noted  . Normal labor 05/25/2015  . Normal vaginal delivery 05/25/2015  . Irregular uterine contractions 05/20/2015  . Migraine headache 05/20/2015     Hospital Course:  Admitted for early labor. Negative GBS. Progressed with augmentation. Utilized epidural for pain management.  Delivery was performed by V.Standard, CNM without complication. Patient and baby tolerated the procedure without difficulty, with  2nd degree vaginal and left labial laceration noted. Infant status was stable and remained in room with mother.  Mother and infant then had an uncomplicated postpartum course, with breast feeding going well. Candice White's physical exam was WNL, and she was discharged home in stable condition. Contraception plan was depo, which she desires at the 6wk PPP.  She received adequate benefit from po pain medications.   Feeding:  breast  Contraception:  Depo-Provera  Hemoglobin Results:  CBC Latest Ref Rng 05/26/2015 05/25/2015 12/13/2013  WBC 4.0 - 10.5 K/uL 19.1(H) 16.2(H) 7.7  Hemoglobin 12.0 - 15.0 g/dL 7.8(G) 11.5(L) 12.6  Hematocrit 36.0 - 46.0 %  28.8(L) 34.4(L) 38.4  Platelets 150 - 400 K/uL 244 324 331     Discharge Physical Exam:   General: alert, cooperative and no distress Mood/Affect: Appropriate/Bright Chest: clear to auscultation, no wheezes, rales or rhonchi, symmetric air entry.  CVS exam: normal rate and regular rhythm. Breast exam: not examined Abdomen:  + bowel sounds, Soft, Mildly Tender Uterine Fundus: firm, U/-1 Lochia: appropriate Laceration: Not Examined DVT Evaluation: Negative Homan's sign. No cords or calf tenderness. No significant calf/ankle edema. Skin exam: Warm, Dry.  Procedures &/or Complications: Intrapartum Procedures: spontaneous vaginal delivery Postpartum Procedures: none Complications-Operative and Postpartum: vaginal and labial laceration   Discharge Information:  Diagnoses: Term Pregnancy-delivered Activity:  pelvic rest Diet:   routine Medications: PNV, Ibuprofen, Iron and Percocet Condition: stable Instructions:  Pain Management, Peri-Care, Breastfeeding, Who and When to call for postpartum complications. Information Sheet(s) given Iron rich Diet, PPD & BB, Depo Discharge to: home  Follow-up Information    Follow up with [facility]. Schedule an appointment as soon as possible for a visit in 6 weeks.   Specialty:  Obstetrics and Gynecology   Why:  Please call if you have any questions or concerns prior to your next visit.   Contact information:   [address] [place] [address] [phone]       Marlene Bast MSN, CNM 05/27/2015 11:37  AM    

## 2015-05-28 ENCOUNTER — Ambulatory Visit: Payer: Self-pay

## 2015-05-28 NOTE — Lactation Note (Signed)
This note was copied from the chart of Candice Nikiya Starn. Lactation Consultation Note  Consulted with mom on how feeding are going today. Mom reported that her breast feel fuller and that she feels he is latching on well and that she is hearing audible swallows and feels breast softening after feeding. Sya was on phototherapy blanket over night and was taken off this morning. Plan is to recheck bili at 2. Asked mom to call Lactation prior to next feeding for feeding observation.  Patient Name: Candice White [id number] Date: 05/28/2015     Maternal Data    Feeding Feeding Type: Breast Fed Length of feed: 10 min  LATCH Score/Interventions Latch: Grasps breast easily, tongue down, lips flanged, rhythmical sucking.  Audible Swallowing: A few with stimulation  Type of Nipple: Everted at rest and after stimulation  Comfort (Breast/Nipple): Soft / non-tender     Hold (Positioning): Assistance needed to correctly position infant at breast and maintain latch.  LATCH Score: 8  Lactation Tools Discussed/Used     Consult Status      Candice White 05/28/2015, 11:29 AM

## 2015-09-15 ENCOUNTER — Encounter: Payer: Medicaid Other | Admitting: Family Medicine

## 2015-09-18 ENCOUNTER — Encounter: Payer: Medicaid Other | Admitting: Family Medicine

## 2015-10-11 ENCOUNTER — Encounter: Payer: Medicaid Other | Admitting: Family Medicine

## 2015-11-01 ENCOUNTER — Encounter: Payer: Medicaid Other | Admitting: Family Medicine

## 2015-11-08 ENCOUNTER — Ambulatory Visit: Payer: Medicaid Other | Admitting: Family Medicine

## 2015-11-23 ENCOUNTER — Encounter: Payer: Medicaid Other | Admitting: Family Medicine

## 2015-11-30 ENCOUNTER — Encounter: Payer: Self-pay | Admitting: Internal Medicine

## 2015-11-30 ENCOUNTER — Ambulatory Visit (INDEPENDENT_AMBULATORY_CARE_PROVIDER_SITE_OTHER): Payer: Medicaid Other | Admitting: Internal Medicine

## 2015-11-30 ENCOUNTER — Other Ambulatory Visit (HOSPITAL_COMMUNITY)
Admission: RE | Admit: 2015-11-30 | Discharge: 2015-11-30 | Disposition: A | Discharge status: Home / Self Care | Payer: Medicaid Other | Source: Ambulatory Visit | Attending: Family Medicine | Admitting: Family Medicine

## 2015-11-30 VITALS — BP 102/60 | HR 103 | Temp 98.2°F | Wt 112.0 lb

## 2015-11-30 DIAGNOSIS — Z113 Encounter for screening for infections with a predominantly sexual mode of transmission: Secondary | ICD-10-CM | POA: Diagnosis not present

## 2015-11-30 DIAGNOSIS — N898 Other specified noninflammatory disorders of vagina: Secondary | ICD-10-CM | POA: Diagnosis present

## 2015-11-30 DIAGNOSIS — Z309 Encounter for contraceptive management, unspecified: Secondary | ICD-10-CM | POA: Diagnosis not present

## 2015-11-30 DIAGNOSIS — Z3009 Encounter for other general counseling and advice on contraception: Secondary | ICD-10-CM | POA: Insufficient documentation

## 2015-11-30 LAB — POCT WET PREP (WET MOUNT)
Clue Cells Wet Prep Whiff POC: NEGATIVE
WBC, Wet Prep HPF POC: >20

## 2015-11-30 LAB — POCT URINE PREGNANCY: PREG TEST UR: NEGATIVE

## 2015-11-30 LAB — HIV ANTIBODY (ROUTINE TESTING W REFLEX): HIV: NONREACTIVE

## 2015-11-30 MED ORDER — CEFTRIAXONE SODIUM 1 G IJ SOLR
250.0000 mg | Freq: Once | INTRAMUSCULAR | Status: AC
  Administered 2015-11-30: 250 mg via INTRAMUSCULAR

## 2015-11-30 MED ORDER — AZITHROMYCIN 500 MG PO TABS
1000.0000 mg | ORAL_TABLET | Freq: Once | ORAL | Status: AC
  Administered 2015-11-30: 1000 mg via ORAL

## 2015-11-30 MED ORDER — NORELGESTROMIN-ETH ESTRADIOL 150-35 MCG/24HR TD PTWK: 1.0000 | MEDICATED_PATCH | TRANSDERMAL | Status: DC

## 2015-11-30 NOTE — Patient Instructions (Addendum)
It was so nice to meet you!  I have prescribed the Ortho Evra patch for you to start taking. Please keep change the patch every week for 3 weeks, and then do not use a patch during the week of your period.  We gave you a shot of Ceftriaxone and some tablets of Azithromycin today. If you are still having discharge at the beginning of next week, please give [place] a call. We may need to give you another dose of the Azithromycin.  I will call you with the results of your tests.  -Dr. Milta Deiters Estradiol; Norelgestromin skin patches What is this medicine? ETHINYL ESTRADIOL;NORELGESTROMIN (ETH in il es tra DYE ole; nor el JES troe min) skin patch is used as a contraceptive (birth control method). This medicine combines two types of female hormones, an estrogen and a progestin. This patch is used to prevent ovulation and pregnancy. This medicine may be used for other purposes; ask your health care provider or pharmacist if you have questions. What should I tell my health care provider before I take this medicine? They need to know if you have or ever had any of these conditions: -abnormal vaginal bleeding -blood vessel disease or blood clots -breast, cervical, endometrial, ovarian, liver, or uterine cancer -diabetes -gallbladder disease -heart disease or recent heart attack -high blood pressure -high cholesterol -kidney disease -liver disease -migraine headaches -stroke -systemic lupus erythematosus (SLE) -an unusual or allergic reaction to estrogens, progestins, other medicines, foods, dyes, or preservatives -pregnant or trying to get pregnant -breast-feeding How should I use this medicine? This patch is applied to the skin. Follow the directions on the prescription label. Apply to clean, dry, healthy skin on the buttock, abdomen, upper outer arm or upper torso, in a place where it will not be rubbed by tight clothing. Do not use lotions or other cosmetics on the site where the patch  will go. Press the patch firmly in place for 10 seconds to ensure good contact with the skin. Change the patch every 7 days on the same day of the week for 3 weeks. You will then have a break from the patch for 1 week, after which you will apply a new patch. Do not use your medicine more often than directed. Contact your pediatrician regarding the use of this medicine in children. Special care may be needed. This medicine has been used in female children who have started having menstrual periods. A patient package insert for the product will be given with each prescription and refill. Read this sheet carefully each time. The sheet may change frequently. Overdosage: If you think you have taken too much of this medicine contact a poison control center or emergency room at once. NOTE: This medicine is only for you. Do not share this medicine with others. What if I miss a dose? You will need to replace your patch once a week as directed. If your patch is lost or falls off, contact your health care professional for advice. You may need to use another form of birth control if your patch has been off for more than 1 day. What may interact with this medicine? -acetaminophen -antibiotics or medicines for infections, especially rifampin, rifabutin, rifapentine, and griseofulvin, and possibly penicillins or tetracyclines -aprepitant -ascorbic acid (vitamin C) -atorvastatin -barbiturate medicines, such as phenobarbital -bosentan -carbamazepine -caffeine -clofibrate -cyclosporine -dantrolene -doxercalciferol -felbamate -grapefruit juice -hydrocortisone -medicines for anxiety or sleeping problems, such as diazepam or temazepam -medicines for diabetes, including pioglitazone -modafinil -mycophenolate -nefazodone -  oxcarbazepine -phenytoin -prednisolone -ritonavir or other medicines for HIV infection or AIDS -rosuvastatin -selegiline -soy isoflavones supplements -St. John's wort -tamoxifen or  raloxifene -theophylline -thyroid hormones -topiramate -warfarin This list may not describe all possible interactions. Give your health care provider a list of all the medicines, herbs, non-prescription drugs, or dietary supplements you use. Also tell them if you smoke, drink alcohol, or use illegal drugs. Some items may interact with your medicine. What should I watch for while using this medicine? Visit your doctor or health care professional for regular checks on your progress. You will need a regular breast and pelvic exam and Pap smear while on this medicine. Use an additional method of contraception during the first cycle that you use this patch. If you have any reason to think you are pregnant, stop using this medicine right away and contact your doctor or health care professional. If you are using this medicine for hormone related problems, it may take several cycles of use to see improvement in your condition. Smoking increases the risk of getting a blood clot or having a stroke while you are using hormonal birth control, especially if you are more than 24 years old. You are strongly advised not to smoke. This medicine can make your body retain fluid, making your fingers, hands, or ankles swell. Your blood pressure can go up. Contact your doctor or health care professional if you feel you are retaining fluid. This medicine can make you more sensitive to the sun. Keep out of the sun. If you cannot avoid being in the sun, wear protective clothing and use sunscreen. Do not use sun lamps or tanning beds/booths. If you wear contact lenses and notice visual changes, or if the lenses begin to feel uncomfortable, consult your eye care specialist. In some women, tenderness, swelling, or minor bleeding of the gums may occur. Notify your dentist if this happens. Brushing and flossing your teeth regularly may help limit this. See your dentist regularly and inform your dentist of the medicines you are  taking. If you are going to have elective surgery or a MRI, you may need to stop using this medicine before the surgery or MRI. Consult your health care professional for advice. This medicine does not protect you against HIV infection (AIDS) or any other sexually transmitted diseases. What side effects may I notice from receiving this medicine? Side effects that you should report to your doctor or health care professional as soon as possible: -breast tissue changes or discharge -changes in vaginal bleeding during your period or between your periods -chest pain -coughing up blood -dizziness or fainting spells -headaches or migraines -leg, arm or groin pain -severe or sudden headaches -stomach pain (severe) -sudden shortness of breath -sudden loss of coordination, especially on one side of the body -speech problems -symptoms of vaginal infection like itching, irritation or unusual discharge -tenderness in the upper abdomen -vomiting -weakness or numbness in the arms or legs, especially on one side of the body -yellowing of the eyes or skin Side effects that usually do not require medical attention (report to your doctor or health care professional if they continue or are bothersome): -breakthrough bleeding and spotting that continues beyond the 3 initial cycles of pills -breast tenderness -mood changes, anxiety, depression, frustration, anger, or emotional outbursts -increased sensitivity to sun or ultraviolet light -nausea -skin rash, acne, or brown spots on the skin -weight gain (slight) This list may not describe all possible side effects. Call your doctor for medical advice about  side effects. You may report side effects to FDA at 1-800-FDA-1088. Where should I keep my medicine? Keep out of the reach of children. Store at room temperature between 15 and 30 degrees C (59 and 86 degrees F). Keep the patch in its pouch until time of use. Throw away any unused medicine after the  expiration date. Dispose of used patches properly. Since a used patch may still contain active hormones, fold the patch in half so that it sticks to itself prior to disposal. Throw away in a place where children or pets cannot reach. NOTE: This sheet is a summary. It may not cover all possible information. If you have questions about this medicine, talk to your doctor, pharmacist, or health care provider.    2016, Elsevier/Gold Standard. (2008-09-01 62:13:08)

## 2015-11-30 NOTE — Assessment & Plan Note (Addendum)
Pt has been having unprotected intercourse. She is not on any birth control right now, but is open to starting a birth control today. She has been on Depo in the past, but it made her depressed. She has also been on an OCP in the past, but kept forgetting to take it. We discussed the risks and benefits of different methods of birth control. Pt is interested in the patch. - Urine pregnancy test was negative - Will prescribe Ortho Evra patch - Pt counseled that the patch will not protect against STDs - Follow-up as needed with PCP

## 2015-11-30 NOTE — Assessment & Plan Note (Addendum)
Pt has a history of BV and PID. Vaginal discharge is thick and white on exam. No cervical motion tenderness, fevers, or chills. Some adnexal tenderness bilaterally. - Urine pregnancy test negative - Wet prep- few yeast and TNTC WBC, no clue cells, no trich - GC/Chlamydia pending - HIV, syphilis pending - Given her history of PID, unprotected intercourse, TNTC WBC, and adnexal tenderness, will treat her for cervicitis with Ceftriaxone  IM and Azithromycin 1g PO in the office. - May consider treating with Diflucan if her discharge does not improve, given that she had few yeast on wet prep and her discharge was thick and white - Precepted with Dr. Mauricio Po

## 2015-11-30 NOTE — Progress Notes (Signed)
[facility] Phone: [phone]  Subjective:  Vaginal discharge: Has been having vaginal discharge for 1 month. Went away for a couple days, then came back and was "chunky". The discharge is white/green. She is sexually active with one partner, but she has not been using any birth control. Has not been using condoms. Her vaginal area is "itchy". No dysuria, no urinary frequency. No fevers, chills. Has been having sharp suprapubic abdominal pain since having her baby. LMP was the middle of February, but she doesn't remember exactly when. She has been on Depo before, but it made her depressed. She has been on OCPs before, but she kept forgetting to take it. She is interested in the patch.   ROS: See HPI for pertinent positives and negatives Past Medical History- hx of BV and PID, recently had a baby 05/2015 Reviewed problem list.  Medications- reviewed and updated Current Outpatient Prescriptions  Medication Sig Dispense Refill  . ferrous sulfate 325 (65 FE) MG tablet Take 1 tablet (325 mg total) by mouth 2 (two) times daily with a meal. For 14 days, then take once daily for 28 days. 45 tablet 3  . ibuprofen (ADVIL,MOTRIN) 600 MG tablet Take 1 tablet (600 mg total) by mouth every 6 (six) hours as needed. 30 tablet 2  . medroxyPROGESTERone (DEPO-PROVERA) 150 MG/ML injection Inject 1 mL (150 mg total) into the muscle every 3 (three) months. 1 mL 12  . oxyCODONE-acetaminophen (PERCOCET/ROXICET) 5-325 MG per tablet Take 1-2 tablets by mouth every 8 (eight) hours as needed (for pain scale greater than 7). 14 tablet 0   No current facility-administered medications for this visit.   Chief complaint-noted Family history reviewed for today's visit. No changes. Social history- patient is a former smoker. Quit in 2016.   Objective: There were no vitals taken for this visit. Gen: NAD, alert, cooperative with exam HEENT: NCAT, EOMI, MMM Neck: FROM, supple Resp: normal work of  breathing GI: SNTND, BS present, no guarding or organomegaly GU: External genitalia appears normal, no lesions, cervix appears normal, vaginal discharge is thick and white in appearance, vaginal walls appear normal, no cervical motion tenderness, mild adnexal tenderness bilaterally Msk: No edema, warm, normal tone, moves UE/LE spontaneously Neuro: Alert and oriented, no gross deficits Skin: No rashes, no lesions Psych: Appropriate behavior  Assessment/Plan: Vaginal Discharge: Pt has a history of BV and PID. Vaginal discharge is thick and white on exam. No cervical motion tenderness, fevers, or chills. Some adnexal tenderness bilaterally. - Urine pregnancy test negative - Wet prep- few yeast and TNTC WBC, no clue cells, no trich - GC/Chlamydia pending - HIV, syphilis pending - Given her history of PID, unprotected intercourse, TNTC WBC, and adnexal tenderness, will treat her for cervicitis with Ceftriaxone  IM and Azithromycin 1g PO in the office. - May consider treating with Diflucan if her discharge does not improve, given that she had few yeast on wet prep and her discharge was thick and white - Precepted with Dr. Mauricio Po  Birth Control Counseling:  Pt has been having unprotected intercourse. She is not on any birth control right now, but is open to starting a birth control today. She has been on Depo in the past, but it made her depressed. She has also been on an OCP in the past, but kept forgetting to take it. We discussed the risks and benefits of different methods of birth control. Pt is interested in the patch. - Urine pregnancy test was negative - Will  prescribe Ortho Evra patch - Follow-up as needed with PCP   Willadean Carol, MD PGY-1

## 2015-12-01 ENCOUNTER — Other Ambulatory Visit: Payer: Self-pay | Admitting: Internal Medicine

## 2015-12-01 LAB — CERVICOVAGINAL ANCILLARY ONLY
CHLAMYDIA, DNA PROBE: NEGATIVE
NEISSERIA GONORRHEA: NEGATIVE

## 2015-12-01 LAB — RPR

## 2015-12-01 MED ORDER — FLUCONAZOLE 150 MG PO TABS: 150.0000 mg | ORAL_TABLET | Freq: Once | ORAL | Status: DC

## 2015-12-01 NOTE — Progress Notes (Unsigned)
Gonorrhea and Chlamydia testing came back negative. Will call in a prescription for Diflucan 150mg  x 1, given that her wet prep showed few yeast.  Willadean CarolKaty Jahaziel Francois, MD PGY-1

## 2015-12-08 ENCOUNTER — Ambulatory Visit (INDEPENDENT_AMBULATORY_CARE_PROVIDER_SITE_OTHER): Payer: Medicaid Other | Admitting: Family Medicine

## 2015-12-08 ENCOUNTER — Encounter: Payer: Self-pay | Admitting: Family Medicine

## 2015-12-08 VITALS — BP 78/56 | HR 86 | Temp 98.4°F | Wt 113.2 lb

## 2015-12-08 DIAGNOSIS — B3731 Acute candidiasis of vulva and vagina: Secondary | ICD-10-CM

## 2015-12-08 DIAGNOSIS — B373 Candidiasis of vulva and vagina: Secondary | ICD-10-CM

## 2015-12-08 MED ORDER — FLUCONAZOLE 150 MG PO TABS: 150.0000 mg | ORAL_TABLET | Freq: Once | ORAL | Status: DC

## 2015-12-08 NOTE — Progress Notes (Signed)
Patient ID: Candice White, female   DOB: August 14, 1991, 25 y.o.   MRN: [id number]   [facility] Yolande Jollyaleb G Guiseppe Flanagan, MD Phone: [phone]  Subjective:   # Here for F/U Vaginal Discharge - Recently seen in clinic for vaginal discharge - Was evaluated and concern for cervicitis at evaluation.  - Given Abx at the clinic visit.  - Laboratory testing only positive for yeast. Was not treated for yeast.  - Continues to have some discharge. Improving symptoms overall however.  - No abdominal pain, fever, or chills. No nausea or vomiting.  - Negative pregnancy test last week.  - started on OCP's at last visit as well.  - Needs treatment for Yeast.   # HCM - - needs pap smear when not having an infection.  - Denies flu vaccine at this time.  - Not smoking with estrogen.  - TDAP up to date.  - Chlamydia screening done and negative.   All relevant systems were reviewed and were negative unless otherwise noted in the HPI  Past Medical History Reviewed problem list.  Medications- reviewed and updated Current Outpatient Prescriptions  Medication Sig Dispense Refill  . ibuprofen (ADVIL,MOTRIN) 600 MG tablet Take 1 tablet (600 mg total) by mouth every 6 (six) hours as needed. 30 tablet 2  . norelgestromin-ethinyl estradiol (ORTHO EVRA) 150-35 MCG/24HR transdermal patch Place 1 patch onto the skin once a week. 3 patch 12  . fluconazole (DIFLUCAN) 150 MG tablet Take 1 tablet (150 mg total) by mouth once. 1 tablet 0   No current facility-administered medications for this visit.   Chief complaint-noted No additions to family history Social history- patient is a non smoker  Objective: BP 78/56 mmHg  Pulse 86  Temp(Src) 98.4 F (36.9 C) (Oral)  Wt 113 lb 3.2 oz (51.347 kg)  LMP 11/13/2015 (Approximate) Gen: NAD, alert, cooperative with exam HEENT: NCAT, EOMI, PERRL Neck: FROM, supple CV: RRR, good S1/S2, no murmur Resp: CTABL, no wheezes, non-labored Abd: SNTND, BS  present, no guarding or organomegaly Ext: No edema, warm, normal tone, moves UE/LE spontaneously Neuro: Alert and oriented, No gross deficits Skin: no rashes no lesions  Assessment/Plan:  # Vaginal Discharge - yeast upon review of recent testing. Was not treated for this.  - Sent in rx for diflucan - F/U if not resolving  # HCM - needs pap smear when not infected.  - return as needed for pap. ] - denies flu shot.  - Otherwise up to date.  - taking her OCP's without an issue.

## 2015-12-08 NOTE — Patient Instructions (Signed)
Thanks for coming in today.   We will send in a prescription for you for your yeast infection.   Come back when your infection is resolved, and we will perform your pap smear.   This can be done any time.   If you have any questions or concerns, then let us know.   Do not smoke cigarettes while on the birth control.    Thanks for letting [facility] take care of you.   Sincerely, Devota Pacealeb Nainika Newlun, MD Family Medicine - PGY 2

## 2016-01-16 ENCOUNTER — Ambulatory Visit (HOSPITAL_COMMUNITY)
Admission: RE | Admit: 2016-01-16 | Discharge: 2016-01-16 | Disposition: A | Discharge status: Home / Self Care | Payer: Medicaid Other | Source: Ambulatory Visit | Attending: Family Medicine | Admitting: Family Medicine

## 2016-01-16 ENCOUNTER — Encounter: Payer: Self-pay | Admitting: Family Medicine

## 2016-01-16 ENCOUNTER — Ambulatory Visit (INDEPENDENT_AMBULATORY_CARE_PROVIDER_SITE_OTHER): Payer: Medicaid Other | Admitting: Family Medicine

## 2016-01-16 VITALS — BP 96/56 | HR 74 | Temp 98.3°F | Ht 63.0 in | Wt 111.9 lb

## 2016-01-16 DIAGNOSIS — J019 Acute sinusitis, unspecified: Secondary | ICD-10-CM | POA: Diagnosis present

## 2016-01-16 DIAGNOSIS — I498 Other specified cardiac arrhythmias: Secondary | ICD-10-CM | POA: Insufficient documentation

## 2016-01-16 DIAGNOSIS — R040 Epistaxis: Secondary | ICD-10-CM

## 2016-01-16 DIAGNOSIS — R079 Chest pain, unspecified: Secondary | ICD-10-CM

## 2016-01-16 DIAGNOSIS — N912 Amenorrhea, unspecified: Secondary | ICD-10-CM | POA: Diagnosis not present

## 2016-01-16 DIAGNOSIS — B002 Herpesviral gingivostomatitis and pharyngotonsillitis: Secondary | ICD-10-CM | POA: Diagnosis not present

## 2016-01-16 LAB — POCT URINE PREGNANCY: Preg Test, Ur: NEGATIVE

## 2016-01-16 MED ORDER — AMOXICILLIN-POT CLAVULANATE 875-125 MG PO TABS: 1.0000 | ORAL_TABLET | Freq: Two times a day (BID) | ORAL | Status: DC

## 2016-01-16 MED ORDER — VALACYCLOVIR HCL 1 G PO TABS: 2000.0000 mg | ORAL_TABLET | Freq: Two times a day (BID) | ORAL | Status: DC

## 2016-01-16 NOTE — Progress Notes (Signed)
Date of Visit: 01/16/2016   HPI:  Patient presents for a same day walk-in appointment to discuss cold symptoms and nose bleed.  Has had nasal congestion and cough for about 2 weeks. No known objective fevers but has had cold sweats at night. Has had nosebleed out of R nostril occuring several times per day, lasts 5 mins at a time. Eating and drinking well. Stooling and urinating normally. Denies scratchy throat, itchy eyes, sick contacts. No dental pain or facial pain now. Works in housekeeping/cleaning of hotel rooms and thinks she may have come into contact with mold. No recent history of nosebleeds, though did have some as a child.   Also notes that she has a lip lesion on her upper lip that began yesterday morning. Has history of cold sores in the past.   Also complains of headache that has come and gone for the last 2 weeks, improves with tylenol & aleve and then returns. Also endorses chest pain in the middle of her chest that has come and gone. Happens about 2x per day. Endorses that it is accompanied by feeling out of breath, sweating. No accompanying nausea. Located in middle of chest. Does not radiate. On Saturday it was worse [place] Did not resolve with rest. Denies having chest pain now.   She is not on birth control. Previously was on the patch but has not taken recently. Unsure of LMP.   ROS: See HPI  PMFSH: history of vaginal delivery August 2016. G1P1001. Otherwise healthy.   PHYSICAL EXAM: BP 96/56 mmHg  Pulse 74  Temp(Src) 98.3 F (36.8 C) (Oral)  Ht 5\' 3"  (1.6 m)  Wt 111 lb 14.4 oz (50.758 kg)  BMI 19.83 kg/m2 Gen: NAD, pleasant, cooperative. HEENT: normocephalic, atraumatic. Moist mucous membranes. Oropharynx clear and moist. +tender anterior cervical lymphadenopathy. Vesicular lesion to middle of upper lip. Nares with boggy turbinates and drainage. Area of fresh bleeding noted on anterior nasal septum in R nostril. Heart: regular rate  and rhythm, no murmur. Chest pain not reproducible with palpation Lungs: clear to auscultation bilaterally, normal work of breathing  Abdomen: soft nontender to palpation  Neuro: alert, grossly nonfocal, speech normal  PROCEDURE NOTE:  After informed consent was obtained and documented in the chart, patient laid supine. Silver nitrate applied to anterior R nasal septum over area of bleeding. Total application time less than 10 seconds. Hemostasis noted. Antibiotic ointment applied over the site.   ASSESSMENT/PLAN:  1. URI symptoms - present for 2 weeks, with some cold sweats at night, not improving. Warrants treatment for sinusitis. Will rx augmentin. Follow up if not improving.  2. Epistaxis - likely secondary to sinusitis. Area of bleeding was cauterized today using silver nitrate. Follow up if not improving  3. Cold sore - obvious oral herpes. Treat with valtrex 2g twice daily for one day  4. Chest pain - highly doubt cardiac etiology given young age and overall health. Most likely a result of coughing/URI symptoms. She is NOT on estrogen-containing birth control. EKG performed today and is unremarkable. Follow up if not improving. Discussed chest pain ED precautions and when to seek emergency care.  FOLLOW UP: Follow up as needed if symptoms worsen or fail to improve.    GrenadaBrittany J. Pollie MeyerMcIntyre, MD [facility]

## 2016-01-16 NOTE — Patient Instructions (Signed)
I sent in two medicines for you - valtrex (valacyclovir) - for cold sore. Two pills now, then repeat in 12 hours - augmentin - to treat sinus infection  We cauterized the nosebleed today. Keep vaseline applied to the inside of your nose for the next several days If nosebleed recurs or worsens please call us and I can refer you to an ear nose and throat doctor  Be well, Dr. Pollie MeyerMcIntyre   Sinusitis, Adult Sinusitis is redness, soreness, and inflammation of the paranasal sinuses. Paranasal sinuses are air pockets within the bones of your face. They are located beneath your eyes, in the middle of your forehead, and above your eyes. In healthy paranasal sinuses, mucus is able to drain out, and air is able to circulate through them by way of your nose. However, when your paranasal sinuses are inflamed, mucus and air can become trapped. This can allow bacteria and other germs to grow and cause infection. Sinusitis can develop quickly and last only a short time (acute) or continue over a long period (chronic). Sinusitis that lasts for more than 12 weeks is considered chronic. CAUSES Causes of sinusitis include:  Allergies.  Structural abnormalities, such as displacement of the cartilage that separates your nostrils (deviated septum), which can decrease the air flow through your nose and sinuses and affect sinus drainage.  Functional abnormalities, such as when the small hairs (cilia) that line your sinuses and help remove mucus do not work properly or are not present. SIGNS AND SYMPTOMS Symptoms of acute and chronic sinusitis are the same. The primary symptoms are pain and pressure around the affected sinuses. Other symptoms include:  Upper toothache.  Earache.  Headache.  Bad breath.  Decreased sense of smell and taste.  A cough, which worsens when you are lying flat.  Fatigue.  Fever.  Thick drainage from your nose, which often is green and may contain pus (purulent).  Swelling and  warmth over the affected sinuses. DIAGNOSIS Your health care provider will perform a physical exam. During your exam, your health care provider may perform any of the following to help determine if you have acute sinusitis or chronic sinusitis:  Look in your nose for signs of abnormal growths in your nostrils (nasal polyps).  Tap over the affected sinus to check for signs of infection.  View the inside of your sinuses using an imaging device that has a light attached (endoscope). If your health care provider suspects that you have chronic sinusitis, one or more of the following tests may be recommended:  Allergy tests.  Nasal culture. A sample of mucus is taken from your nose, sent to a lab, and screened for bacteria.  Nasal cytology. A sample of mucus is taken from your nose and examined by your health care provider to determine if your sinusitis is related to an allergy. TREATMENT Most cases of acute sinusitis are related to a viral infection and will resolve on their own within 10 days. Sometimes, medicines are prescribed to help relieve symptoms of both acute and chronic sinusitis. These may include pain medicines, decongestants, nasal steroid sprays, or saline sprays. However, for sinusitis related to a bacterial infection, your health care provider will prescribe antibiotic medicines. These are medicines that will help kill the bacteria causing the infection. Rarely, sinusitis is caused by a fungal infection. In these cases, your health care provider will prescribe antifungal medicine. For some cases of chronic sinusitis, surgery is needed. Generally, these are cases in which sinusitis recurs more than  3 times per year, despite other treatments. HOME CARE INSTRUCTIONS  Drink plenty of water. Water helps thin the mucus so your sinuses can drain more easily.  Use a humidifier.  Inhale steam 3-4 times a day (for example, sit in the bathroom with the shower running).  Apply a warm,  moist washcloth to your face 3-4 times a day, or as directed by your health care provider.  Use saline nasal sprays to help moisten and clean your sinuses.  Take medicines only as directed by your health care provider.  If you were prescribed either an antibiotic or antifungal medicine, finish it all even if you start to feel better. SEEK IMMEDIATE MEDICAL CARE IF:  You have increasing pain or severe headaches.  You have nausea, vomiting, or drowsiness.  You have swelling around your face.  You have vision problems.  You have a stiff neck.  You have difficulty breathing.   This information is not intended to replace advice given to you by your health care provider. Make sure you discuss any questions you have with your health care provider.   Document Released: 09/16/2005 Document Revised: 10/07/2014 Document Reviewed: 10/01/2011 Elsevier Interactive Patient Education 2016 [employer].   Nosebleed Nosebleeds are common. They are due to a crack in the inside lining of your nose (mucous membrane) or from a small blood vessel that starts to bleed. Nosebleeds can be caused by many conditions, such as injury, infections, dry mucous membranes or dry climate, medicines, nose picking, and home heating and cooling systems. Most nosebleeds come from blood vessels in the front of your nose. HOME CARE INSTRUCTIONS   Try controlling your nosebleed by pinching your nostrils gently and continuously for at least 10 minutes.  Avoid blowing or sniffing your nose for a number of hours after having a nosebleed.  Do not put gauze inside your nose yourself. If your nose was packed by your health care provider, try to maintain the pack inside of your nose until your health care provider removes it.  If a gauze pack was used and it starts to fall out, gently replace it or cut off the end of it.  If a balloon catheter was used to pack your nose, do not cut or remove it unless your health care  provider has instructed you to do that.  Avoid lying down while you are having a nosebleed. Sit up and lean forward.  Use a nasal spray decongestant to help with a nosebleed as directed by your health care provider.  Do not use petroleum jelly or mineral oil in your nose. These can drip into your lungs.  Maintain humidity in your home by using less air conditioning or by using a humidifier.  Aspirinand blood thinners make bleeding more likely. If you are prescribed these medicines and you suffer from nosebleeds, ask your health care provider if you should stop taking the medicines or adjust the dose. Do not stop medicines unless directed by your health care provider  Resume your normal activities as you are able, but avoid straining, lifting, or bending at the waist for several days.  If your nosebleed was caused by dry mucous membranes, use over-the-counter saline nasal spray or gel. This will keep the mucous membranes moist and allow them to heal. If you must use a lubricant, choose the water-soluble variety. Use it only sparingly, and do not use it within several hours of lying down.  Keep all follow-up visits as directed by your health care provider. This  is important. SEEK MEDICAL CARE IF:  You have a fever.  You get frequent nosebleeds.  You are getting nosebleeds more often. SEEK IMMEDIATE MEDICAL CARE IF:  Your nosebleed lasts longer than 20 minutes.  Your nosebleed occurs after an injury to your face, and your nose looks crooked or broken.  You have unusual bleeding from other parts of your body.  You have unusual bruising on other parts of your body.  You feel light-headed or you faint.  You become sweaty.  You vomit blood.  Your nosebleed occurs after a head injury.   This information is not intended to replace advice given to you by your health care provider. Make sure you discuss any questions you have with your health care provider.   Document Released:  06/26/2005 Document Revised: 10/07/2014 Document Reviewed: 05/02/2014 Elsevier Interactive Patient Education 2016 [employer] Inc[employer]  Cold Sore A cold sore (fever blister) is a skin infection caused by the herpes simplex virus (HSV-1). HSV-1 is closely related to the virus that causes genital herpes (HSV-2), but they are not the same even though both viruses can cause oral and genital infections. Cold sores are small, fluid-filled sores inside of the mouth or on the lips, gums, nose, chin, cheeks, or fingers.  The herpes simplex virus can be easily passed (contagious) to other people through close personal contact, such as kissing or sharing personal items. The virus can also spread to other parts of the body, such as the eyes or genitals. Cold sores are contagious until the sores crust over completely. They often heal within 2 weeks.  Once a person is infected, the herpes simplex virus remains permanently in the body. Therefore, there is no cure for cold sores, and they often recur when a person is tired, stressed, sick, or gets too much sun. Additional factors that can cause a recurrence include hormone changes in menstruation or pregnancy, certain drugs, and cold weather.  CAUSES  Cold sores are caused by the herpes simplex virus. The virus is spread from person to person through close contact, such as through kissing, touching the affected area, or sharing personal items such as lip balm, razors, or eating utensils.  SYMPTOMS  The first infection may not cause symptoms. If symptoms develop, the symptoms often go through different stages. Here is how a cold sore develops:   Tingling, itching, or burning is felt 1-2 days before the outbreak.   Fluid-filled blisters appear on the lips, inside the mouth, nose, or on the cheeks.   The blisters start to ooze clear fluid.   The blisters dry up and a yellow crust appears in its place.   The crust falls off.  Symptoms depend on whether it is the  initial outbreak or a recurrence. Some other symptoms with the first outbreak may include:   Fever.   Sore throat.   Headache.   Muscle aches.   Swollen neck glands.  DIAGNOSIS  A diagnosis is often made based on your symptoms and looking at the sores. Sometimes, a sore may be swabbed and then examined in the lab to make a final diagnosis. If the sores are not present, blood tests can find the herpes simplex virus.  TREATMENT  There is no cure for cold sores and no vaccine for the herpes simplex virus. Within 2 weeks, most cold sores go away on their own without treatment. Medicines cannot make the infection go away, but medicine can help relieve some of the pain associated with the sores, can  work to stop the virus from multiplying, and can also shorten healing time. Medicine may be in the form of creams, gels, pills, or a shot.  HOME CARE INSTRUCTIONS   Only take over-the-counter or prescription medicines for pain, discomfort, or fever as directed by your caregiver. Do not use aspirin.   Use a cotton-tip swab to apply creams or gels to your sores.   Do not touch the sores or pick the scabs. Wash your hands often. Do not touch your eyes without washing your hands first.   Avoid kissing, oral sex, and sharing personal items until sores heal.   Apply an ice pack on your sores for 10-15 minutes to ease any discomfort.   Avoid hot, cold, or salty foods because they may hurt your mouth. Eat a soft, bland diet to avoid irritating the sores. Use a straw to drink if you have pain when drinking out of a glass.   Keep sores clean and dry to prevent an infection of other tissues.   Avoid the sun and limit stress if these things trigger outbreaks. If sun causes cold sores, apply sunscreen on the lips before being out in the sun.  SEEK MEDICAL CARE IF:   You have a fever or persistent symptoms for more than 2-3 days.   You have a fever and your symptoms suddenly get worse.    You have pus, not clear fluid, coming from the sores.   You have redness that is spreading.   You have pain or irritation in your eye.   You get sores on your genitals.   Your sores do not heal within 2 weeks.   You have a weakened immune system.   You have frequent recurrences of cold sores.  MAKE SURE YOU:   Understand these instructions.  Will watch your condition.  Will get help right away if you are not doing well or get worse.   This information is not intended to replace advice given to you by your health care provider. Make sure you discuss any questions you have with your health care provider.   Document Released: 09/13/2000 Document Revised: 10/07/2014 Document Reviewed: 01/29/2012 Elsevier Interactive Patient Education [employer]

## 2016-03-12 ENCOUNTER — Encounter: Payer: Medicaid Other | Admitting: Family Medicine

## 2016-04-04 ENCOUNTER — Encounter: Payer: Self-pay | Admitting: Internal Medicine

## 2016-04-04 ENCOUNTER — Other Ambulatory Visit (HOSPITAL_COMMUNITY)
Admission: RE | Admit: 2016-04-04 | Discharge: 2016-04-04 | Disposition: A | Discharge status: Home / Self Care | Payer: Medicaid Other | Source: Ambulatory Visit | Attending: Family Medicine | Admitting: Family Medicine

## 2016-04-04 ENCOUNTER — Ambulatory Visit (INDEPENDENT_AMBULATORY_CARE_PROVIDER_SITE_OTHER): Payer: Medicaid Other | Admitting: Internal Medicine

## 2016-04-04 VITALS — BP 104/68 | HR 84 | Temp 98.3°F | Wt 113.0 lb

## 2016-04-04 DIAGNOSIS — Z113 Encounter for screening for infections with a predominantly sexual mode of transmission: Secondary | ICD-10-CM | POA: Insufficient documentation

## 2016-04-04 DIAGNOSIS — N898 Other specified noninflammatory disorders of vagina: Secondary | ICD-10-CM

## 2016-04-04 LAB — POCT WET PREP (WET MOUNT): CLUE CELLS WET PREP WHIFF POC: NEGATIVE

## 2016-04-04 LAB — POCT URINE PREGNANCY: Preg Test, Ur: NEGATIVE

## 2016-04-04 NOTE — Patient Instructions (Addendum)
It was so nice to see you again!  I have ordered some labs to look for yeast, bacteria, and STDs. I will call you with the results.  -Dr. Nancy MarusMayo

## 2016-04-04 NOTE — Progress Notes (Signed)
   [facility] Phone: [phone]  Subjective:  Vaginal Discharge: Discharge for 1 month. No odor. No vaginal itchiness. The discharge was green/yellow and now it is cream colored. No dysuria, no urinary frequency. No fevers, no chills, no nausea, no vomiting, no abdominal pain. She otherwise feels well.  ROS: See HPI for pertinent positives and negatives Past Medical History- hx of BV Reviewed problem list.  Medications- reviewed and updated Current Outpatient Prescriptions  Medication Sig Dispense Refill  . amoxicillin-clavulanate (AUGMENTIN) 875-125 MG tablet Take 1 tablet by mouth 2 (two) times daily. 20 tablet 0  . fluconazole (DIFLUCAN) 150 MG tablet Take 1 tablet (150 mg total) by mouth once. 1 tablet 0  . ibuprofen (ADVIL,MOTRIN) 600 MG tablet Take 1 tablet (600 mg total) by mouth every 6 (six) hours as needed. 30 tablet 2  . norelgestromin-ethinyl estradiol (ORTHO EVRA) 150-35 MCG/24HR transdermal patch Place 1 patch onto the skin once a week. 3 patch 12  . valACYclovir (VALTREX) 1000 MG tablet Take 2 tablets (2,000 mg total) by mouth every 12 (twelve) hours. For 1 day 4 tablet 0   No current facility-administered medications for this visit.   Chief complaint-noted Family history reviewed for today's visit. No changes. Social history- patient is a former smoker  Objective: BP 104/68 mmHg  Pulse 84  Temp(Src) 98.3 F (36.8 C) (Oral)  Wt 113 lb (51.256 kg)  LMP 03/21/2016  Breastfeeding? No Gen: NAD, alert, cooperative with exam HEENT: NCAT, EOMI, MMM Neck: FROM, supple Resp: Normal work of breathing GU: Normal external genitalia, no lesions, normal appearing cervix, moderate amount of white discharge present Msk: No edema, warm, normal tone, moves UE/LE spontaneously Neuro: Alert and oriented, no gross deficits Skin: No rashes, no lesions Psych: Appropriate behavior  Assessment/Plan: Vaginal Discharge: Pt with vaginal discharge x 1 month.  No odor, no itchiness. She has a history of BV and PID. Do not think this is PID because she has been afebrile without abdominal pain. Cervix appears normal. Vaginal discharge is likely secondary to yeast infection vs BV vs normal physiologic discharge. - Urine pregnancy test negative - Wet prep - GC/Chlamydia - Unable to get HIV/syphilis today, as the lab was already closed. Consider obtaining these at next visit. - Follow-up as needed with PCP   Willadean CarolKaty Mayo, MD PGY-1

## 2016-04-04 NOTE — Assessment & Plan Note (Signed)
Pt with vaginal discharge x 1 month. No odor, no itchiness. She has a history of BV and PID. Do not think this is PID because she has been afebrile without abdominal pain. Cervix appears normal. Vaginal discharge is likely secondary to yeast infection vs BV vs normal physiologic discharge.  - Urine pregnancy test negative  - Wet prep  - GC/Chlamydia  - Unable to get HIV/syphilis today, as the lab was already closed. Consider obtaining these at next visit.  - Follow-up as needed with PCP

## 2016-04-05 LAB — CERVICOVAGINAL ANCILLARY ONLY
CHLAMYDIA, DNA PROBE: NEGATIVE
Neisseria Gonorrhea: NEGATIVE

## 2016-05-23 ENCOUNTER — Encounter: Payer: Self-pay | Admitting: Student

## 2016-05-23 ENCOUNTER — Ambulatory Visit (INDEPENDENT_AMBULATORY_CARE_PROVIDER_SITE_OTHER): Payer: Medicaid Other | Admitting: Student

## 2016-05-23 ENCOUNTER — Encounter: Payer: Self-pay | Admitting: Family Medicine

## 2016-05-23 VITALS — BP 100/60 | HR 90 | Temp 98.4°F | Wt 115.0 lb

## 2016-05-23 DIAGNOSIS — R42 Dizziness and giddiness: Secondary | ICD-10-CM | POA: Insufficient documentation

## 2016-05-23 DIAGNOSIS — R1031 Right lower quadrant pain: Secondary | ICD-10-CM

## 2016-05-23 DIAGNOSIS — G8929 Other chronic pain: Secondary | ICD-10-CM | POA: Diagnosis not present

## 2016-05-23 LAB — POCT URINE PREGNANCY: Preg Test, Ur: NEGATIVE

## 2016-05-23 NOTE — Patient Instructions (Signed)
It was great seeing you today! We have addressed the following issues today  1. Dizziness: this is likely from being on your feet for long time. Less likely to come from your heart. I recommend maintaining good hydration. Avoid caffeinated fluids such as soda and coffee. I also recommend stretching her leg muscles frequently. Please don't hesitate to give us a call if you symptoms get worse or you start having trouble breathing, chest pain or other symptoms concerning to you.    If we did any lab work today, and the results require attention, either me or my nurse will get in touch with you. If everything is normal, you will get a letter in mail. If you don't hear from us in two weeks, please give us a call. Otherwise, I look forward to talking with you again at our next visit. If you have any questions or concerns before then, please call the clinic at [phone].  Please bring all your medications to every doctors visit   Sign up for My Chart to have easy access to your labs results, and communication with your Primary care physician.    Please check-out at the front desk before leaving the clinic.   Take Care,

## 2016-05-23 NOTE — Assessment & Plan Note (Signed)
Likely vasovagal from long standing at work. Doubt cardiac etiology in this young patient without previous cardiac history of family history of cardiac disease. Encouraged her to hydrate herself frequently. Advised her to cut down on sodas and caffeinated drinks. I was differential are anemia and orthostatic hypotension and which are less likely.

## 2016-05-23 NOTE — Progress Notes (Signed)
   Subjective:    Patient ID: Candice White is a 25 y.o. old female.  HPI #Dizziness: this has been going on for a week. She describes the dizziness as lightheaded. She denies vertigo. She also reports diaphoresis yesterday. She works at [employer]. She is on her feet a lot. Dizziness improves with sitting. Reports mid sternal chest pain that improves with rubbing. She doesn't have chest pain now. Denies fall, loss of consciousness, palpitation, nausea, vomiting, fever, chills or shortness of breath. Reports sodas  #Cramping over her right RLQ: pain is sometimes sharp this has been going on for a week. It is on and off. It happens when she is dizzy. Lasts about 2-3 minutes and resolves on its own. Denies dysuria, vaginal discharge or bleeding. Sexually active with one female partner.  PMH: No cardiac history FM: Mother with hypertension, no other cardiac history SH: denies smoking  Review of Systems Per HPI Objective:   Vitals:   05/23/16 0900  BP: 100/60  Pulse: 90  Temp: 98.4 F (36.9 C)  TempSrc: Oral  SpO2: 94%  Weight: 115 lb (52.2 kg)    GEN: appears well, no apparent distress. HEENT:   Oropharynx: mmm without erythema or exudation CVS: RRR, normal s1 and s2, no murmurs, no edema RESP: no increased work of breathing, good air movement bilaterally, no crackles or wheeze GI: bowel sounds normal, soft, non-tender,non-distended GU: No suprapubic or CVA tenderness MSK: No tenderness to palpation over her sternum HEM: No cervical lymphadenopathy NEURO: alert and oriented appropriately, no gross defecits  PSYCH: appropriate mood and affect     Assessment & Plan:  Dizziness Likely vasovagal from long standing at work. Doubt cardiac etiology in this young patient without previous cardiac history of family history of cardiac disease. Encouraged her to hydrate herself frequently. Advised her to cut down on sodas and caffeinated drinks. I was differential are anemia and  orthostatic hypotension and which are less likely.   Abdominal pain, acute, right lower quadrant Unclear etiology. Doubt this is related to her dizziness. Pregnancy test is negative today. Doubt infectious etiologies without dysuria or fever or vaginal discharge. Her exam is totally benign. Reassured patient's and discussed return precautions.

## 2016-05-23 NOTE — Assessment & Plan Note (Signed)
Unclear etiology. Doubt this is related to her dizziness. Pregnancy test is negative today. Doubt infectious etiologies without dysuria or fever or vaginal discharge. Her exam is totally benign. Reassured patient's and discussed return precautions.

## 2016-06-06 ENCOUNTER — Ambulatory Visit: Payer: Medicaid Other | Admitting: Family Medicine

## 2016-06-21 ENCOUNTER — Ambulatory Visit: Payer: Medicaid Other | Admitting: Family Medicine

## 2016-06-26 ENCOUNTER — Ambulatory Visit: Payer: Medicaid Other | Admitting: Family Medicine

## 2016-12-12 ENCOUNTER — Ambulatory Visit (HOSPITAL_COMMUNITY)
Admission: EM | Admit: 2016-12-12 | Discharge: 2016-12-12 | Disposition: A | Discharge status: Home / Self Care | Payer: Medicaid Other | Attending: Family Medicine | Admitting: Family Medicine

## 2016-12-12 ENCOUNTER — Telehealth (HOSPITAL_COMMUNITY): Payer: Self-pay | Admitting: Emergency Medicine

## 2016-12-12 ENCOUNTER — Encounter (HOSPITAL_COMMUNITY): Payer: Self-pay | Admitting: Emergency Medicine

## 2016-12-12 DIAGNOSIS — Z801 Family history of malignant neoplasm of trachea, bronchus and lung: Secondary | ICD-10-CM | POA: Insufficient documentation

## 2016-12-12 DIAGNOSIS — Z87891 Personal history of nicotine dependence: Secondary | ICD-10-CM | POA: Insufficient documentation

## 2016-12-12 DIAGNOSIS — R42 Dizziness and giddiness: Secondary | ICD-10-CM | POA: Diagnosis not present

## 2016-12-12 DIAGNOSIS — N760 Acute vaginitis: Secondary | ICD-10-CM

## 2016-12-12 DIAGNOSIS — G8929 Other chronic pain: Secondary | ICD-10-CM | POA: Insufficient documentation

## 2016-12-12 DIAGNOSIS — L739 Follicular disorder, unspecified: Secondary | ICD-10-CM | POA: Diagnosis not present

## 2016-12-12 DIAGNOSIS — R21 Rash and other nonspecific skin eruption: Secondary | ICD-10-CM | POA: Insufficient documentation

## 2016-12-12 DIAGNOSIS — G43909 Migraine, unspecified, not intractable, without status migrainosus: Secondary | ICD-10-CM | POA: Diagnosis not present

## 2016-12-12 DIAGNOSIS — Z8249 Family history of ischemic heart disease and other diseases of the circulatory system: Secondary | ICD-10-CM | POA: Insufficient documentation

## 2016-12-12 DIAGNOSIS — R109 Unspecified abdominal pain: Secondary | ICD-10-CM | POA: Insufficient documentation

## 2016-12-12 DIAGNOSIS — Z79899 Other long term (current) drug therapy: Secondary | ICD-10-CM | POA: Insufficient documentation

## 2016-12-12 DIAGNOSIS — N898 Other specified noninflammatory disorders of vagina: Secondary | ICD-10-CM | POA: Diagnosis present

## 2016-12-12 DIAGNOSIS — N739 Female pelvic inflammatory disease, unspecified: Secondary | ICD-10-CM | POA: Insufficient documentation

## 2016-12-12 MED ORDER — CEPHALEXIN 500 MG PO CAPS: 500.0000 mg | ORAL_CAPSULE | Freq: Four times a day (QID) | ORAL | 0 refills | Status: DC

## 2016-12-12 MED ORDER — FLUCONAZOLE 150 MG PO TABS: 150.0000 mg | ORAL_TABLET | Freq: Every day | ORAL | 0 refills | Status: DC

## 2016-12-12 NOTE — ED Provider Notes (Signed)
[facility]    CSN: [id number] Arrival date & time: 12/12/16  1520     History   Chief Complaint Chief Complaint  Patient presents with  . Vaginal Discharge  . Rash    HPI Candice White is a 26 y.o. female.   HPI  Vaginal irritation and discharge. Started approximately 5 days ago. Getting worse. Thick white chunky material. Patient states she section active without condoms and with a single partner who denies any STDs. Patient also states that she had vaginal area waxed just prior to onset of symptoms. She also has small little painful hair bumps throughout her private region. She's not tried anything for her symptoms of then daily hygiene with antibacterial soap.   Past Medical History:  Diagnosis Date  . BV (bacterial vaginosis)   . Migraine   . PID (acute pelvic inflammatory disease)     Patient Active Problem List   Diagnosis Date Noted  . Dizziness 05/23/2016  . Abdominal pain, chronic, right lower quadrant 05/23/2016  . Vaginal discharge 11/30/2015  . Birth control counseling 11/30/2015  . Normal labor 05/25/2015  . Normal vaginal delivery 05/25/2015  . Irregular uterine contractions 05/20/2015  . Migraine headache 05/20/2015  . Abdominal pain, acute, right lower quadrant 11/21/2014    Past Surgical History:  Procedure Laterality Date  . NO PAST SURGERIES      OB History    Gravida Para Term Preterm AB Living   1 1 1     1    SAB TAB Ectopic Multiple Live Births         0 1       Home Medications    Prior to Admission medications   Medication Sig Start Date End Date Taking? Authorizing Provider  amoxicillin-clavulanate (AUGMENTIN) 875-125 MG tablet Take 1 tablet by mouth 2 (two) times daily. 01/16/16   Latrelle DodrillBrittany J McIntyre, MD  fluconazole (DIFLUCAN) 150 MG tablet Take 1 tablet (150 mg total) by mouth once. 12/08/15   Yolande Jollyaleb G Melancon, MD  ibuprofen (ADVIL,MOTRIN) 600 MG tablet Take 1 tablet (600 mg total) by mouth every 6 (six) hours as  needed. 05/27/15   Gerrit HeckJessica Emly, CNM  norelgestromin-ethinyl estradiol (ORTHO EVRA) 150-35 MCG/24HR transdermal patch Place 1 patch onto the skin once a week. 11/30/15   Campbell StallKaty Dodd Mayo, MD  valACYclovir (VALTREX) 1000 MG tablet Take 2 tablets (2,000 mg total) by mouth every 12 (twelve) hours. For 1 day 01/16/16   Latrelle DodrillBrittany J McIntyre, MD    Family History Family History  Problem Relation Age of Onset  . Cancer Maternal Aunt     lung cancer  . Cancer Cousin     lung  . Hypertension Mother     Social History Social History  Substance Use Topics  . Smoking status: Former Smoker    Years: 3.00    Quit date: 12/08/2012  . Smokeless tobacco: Former NeurosurgeonUser    Quit date: 10/02/2014  . Alcohol use No     Comment: in past     Allergies   Patient has no known allergies.   Review of Systems Review of Systems Per HPI with all other pertinent systems negative.   Physical Exam Triage Vital Signs ED Triage Vitals [12/12/16 1538]  Enc Vitals Group     BP 110/70     Pulse Rate 80     Resp      Temp 98.3 F (36.8 C)     Temp Source Oral  SpO2 100 %     Weight      Height      Head Circumference      Peak Flow      Pain Score      Pain Loc      Pain Edu?      Excl. in GC?    No data found.   Updated Vital Signs BP 110/70 (BP Location: Right Arm)   Pulse 80   Temp 98.3 F (36.8 C) (Oral)   LMP 11/24/2016 (Approximate)   SpO2 100%   Visual Acuity Right Eye Distance:   Left Eye Distance:   Bilateral Distance:    Right Eye Near:   Left Eye Near:    Bilateral Near:     Physical Exam Physical Exam  Constitutional: oriented to person, place, and time. appears well-developed and well-nourished. No distress.  HENT:  Head: Normocephalic and atraumatic.  Eyes: EOMI. PERRL.  Neck: Normal range of motion.  Cardiovascular: RRR, no m/r/g, 2+ distal pulses,  Pulmonary/Chest: Effort normal and breath sounds normal. No respiratory distress.  Abdominal: Soft. Bowel sounds are  normal. NonTTP, no distension.  Musculoskeletal: Normal range of motion. Non ttp, no effusion.  Neurological: alert and oriented to person, place, and time.  Skin: Skin is warm. No rash noted. non diaphoretic.  Psychiatric: normal mood and affect. behavior is normal. Judgment and thought content normal.  GU: Numerous pustular her follicles throughout the perineal region and labial folds. Vaginal walls well rugated with thick white discharge present.  UC Treatments / Results  Labs (all labs ordered are listed, but only abnormal results are displayed) Labs Reviewed - No data to display  EKG  EKG Interpretation None       Radiology No results found.  Procedures Procedures (including critical care time)  Medications Ordered in UC Medications - No data to display   Initial Impression / Assessment and Plan / UC Course  I have reviewed the triage vital signs and the nursing notes.  Pertinent labs & imaging results that were available during my care of the patient were reviewed by me and considered in my medical decision making (see chart for details).     Likely yeast vaginitis with associated folliculitis from recent waxing. Patient to start Keflex and Diflucan. Wet prep sent and additional medications were called in as necessary pending results.   Final Clinical Impressions(s) / UC Diagnoses   Final diagnoses:  None    New Prescriptions New Prescriptions   No medications on file     Ozella Rocks, MD 12/12/16 1655

## 2016-12-12 NOTE — ED Triage Notes (Signed)
Pt got a bikini wax 5 days ago and since then she has developed a itchy, irritating rash along with white, chunky vaginal discharge.

## 2016-12-12 NOTE — Telephone Encounter (Signed)
Received message that pharmacy had concerns scripts not signed.  Called [facility]/[address], provided verbal orders for 2 medications.    Patient showed up in department while writing note.  Told patient this nurse has spoken to pharmacist at [facility]/[address] and orders have been received

## 2016-12-12 NOTE — Discharge Instructions (Signed)
You have developed a folliculitis or hair follicle infection. This is easily treated with antibiotics. It also appears that you have a vaginal yeast infection. Please start the Diflucan as prescribed. We will call you if any of your other lab results come back positive and you need additional treatment.

## 2016-12-13 LAB — CERVICOVAGINAL ANCILLARY ONLY: Wet Prep (BD Affirm): POSITIVE — AB

## 2016-12-14 LAB — CERVICOVAGINAL ANCILLARY ONLY
Chlamydia: NEGATIVE
Neisseria Gonorrhea: NEGATIVE

## 2016-12-16 ENCOUNTER — Ambulatory Visit: Payer: Medicaid Other | Admitting: Family Medicine

## 2016-12-20 ENCOUNTER — Telehealth (HOSPITAL_COMMUNITY): Payer: Self-pay | Admitting: Emergency Medicine

## 2016-12-20 MED ORDER — METRONIDAZOLE 500 MG PO TABS: 500.0000 mg | ORAL_TABLET | Freq: Two times a day (BID) | ORAL | 0 refills | Status: DC

## 2016-12-20 NOTE — Telephone Encounter (Signed)
Called pt and notified of recent lab results (Gc/Chlam, yeast, BV) Pt ID'd properly... Reports feeling better but still having vag d/c Pt requested for us to call Rx to [facility] ([address]) Pt is taking/tolarating well meds given at visit.  Adv pt if sx are not getting better to return or to f/u w/PCP Education on safe sex given Notified pt that lab results can be obtained through MyChart Pt verb understanding.

## 2016-12-20 NOTE — Telephone Encounter (Signed)
-----   Message from Eustace MooreLaura W Murray, MD sent at 12/17/2016 10:31 PM EDT ----- Please let patient know that tests for candida (yeast) and gardnerella (bacterial vaginosis) were positive.  Rx fluconazole was given at the urgent care visit 12/12/16.   If there are persistent symptoms such as vaginal irritation/discharge, ok to send rx for metronidazole 500mg  bid x 7d #14 no refills to pharmacy.   Recheck or followup with PCP for further evaluation if symptoms are not improving.  LM

## 2016-12-25 ENCOUNTER — Ambulatory Visit: Payer: Medicaid Other | Admitting: Family Medicine

## 2017-01-28 ENCOUNTER — Encounter: Payer: Self-pay | Admitting: Family Medicine

## 2017-01-28 ENCOUNTER — Ambulatory Visit (INDEPENDENT_AMBULATORY_CARE_PROVIDER_SITE_OTHER): Payer: Medicaid Other | Admitting: Family Medicine

## 2017-01-28 ENCOUNTER — Other Ambulatory Visit (HOSPITAL_COMMUNITY)
Admission: RE | Admit: 2017-01-28 | Discharge: 2017-01-28 | Disposition: A | Discharge status: Home / Self Care | Payer: Medicaid Other | Source: Ambulatory Visit | Attending: Family Medicine | Admitting: Family Medicine

## 2017-01-28 VITALS — BP 110/70 | HR 63 | Temp 98.3°F | Ht 63.0 in | Wt 109.0 lb

## 2017-01-28 DIAGNOSIS — N898 Other specified noninflammatory disorders of vagina: Secondary | ICD-10-CM | POA: Insufficient documentation

## 2017-01-28 LAB — POCT WET PREP (WET MOUNT)
Clue Cells Wet Prep Whiff POC: NEGATIVE
TRICHOMONAS WET PREP HPF POC: ABSENT

## 2017-01-28 MED ORDER — METRONIDAZOLE 500 MG PO TABS: 500.0000 mg | ORAL_TABLET | Freq: Two times a day (BID) | ORAL | 0 refills | Status: DC

## 2017-01-28 MED ORDER — FLUCONAZOLE 150 MG PO TABS: 150.0000 mg | ORAL_TABLET | Freq: Every day | ORAL | 0 refills | Status: DC

## 2017-01-28 NOTE — Progress Notes (Signed)
   Subjective:  Candice White is a 26 y.o. female who presents to the [facility] today with a chief complaint of vaginal discharge.   HPI:  Vaginal Discharge Symptoms started several weeks ago. She was seen at urgent care about 6 weeks ago and given a course of keflex and diflucan which did not seem to help her symptoms. No abdominal pain. No fevers or chills. No nausea or vomiting. She has not tried any other treatments.   ROS: Per HPI  PMH: Smoking history reviewed.   Objective:  Physical Exam: BP 110/70   Pulse 63   Temp 98.3 F (36.8 C) (Oral)   Ht  (1.6 m)   Wt 109 lb (49.4 kg)   LMP  (LMP Unknown)   SpO2 97%   BMI 19.31 kg/m   Gen: NAD, resting comfortably GI: Normal bowel sounds present. Soft, Nontender, Nondistended. GU: Normal external female genitalia. Thick white discharge noted in vaginal vault. No CMT.  Skin: warm, dry Neuro: grossly normal, moves all extremities Psych: Normal affect and thought content  Results for orders placed or performed in visit on 01/28/17 (from the past 72 hour(s))  POCT Wet Prep Mellody Drown [place])     Status: Abnormal   Collection Time: 01/28/17 11:55 AM  Result Value Ref Range   Source Wet Prep POC vaginal    WBC, Wet Prep HPF POC 5-10    Bacteria Wet Prep HPF POC Many (A) Few   Clue Cells Wet Prep HPF POC Few (A) None   Clue Cells Wet Prep Whiff POC Negative Whiff    Yeast Wet Prep HPF POC Few    Trichomonas Wet Prep HPF POC Absent Absent     Assessment/Plan:  Vaginal Discharge Wet prep consistent with BV and yeast vaginitis. Will send in Rx for flagyl and dicflucan. GC/CT pending. Return precautions reviewed. Follow up as needed.   Katina Degree. Jimmey Ralph, MD [facility] Resident PGY-3 01/28/2017 11:59 AM

## 2017-01-28 NOTE — Patient Instructions (Addendum)
Start the flagyl.  Take the dose of diflucan today. Take again in 3 days.  Let us know if your symptoms are not improving.  Take care,  Dr Jimmey Ralph

## 2017-01-29 LAB — CERVICOVAGINAL ANCILLARY ONLY
Chlamydia: NEGATIVE
NEISSERIA GONORRHEA: NEGATIVE

## 2017-01-30 ENCOUNTER — Encounter: Payer: Self-pay | Admitting: Family Medicine

## 2017-02-17 ENCOUNTER — Encounter: Payer: Medicaid Other | Admitting: Family Medicine

## 2017-05-18 ENCOUNTER — Ambulatory Visit (HOSPITAL_COMMUNITY)
Admission: EM | Admit: 2017-05-18 | Discharge: 2017-05-18 | Disposition: A | Discharge status: Home / Self Care | Payer: Medicaid Other | Attending: Family Medicine | Admitting: Family Medicine

## 2017-05-18 ENCOUNTER — Encounter (HOSPITAL_COMMUNITY): Payer: Self-pay | Admitting: Emergency Medicine

## 2017-05-18 DIAGNOSIS — M779 Enthesopathy, unspecified: Secondary | ICD-10-CM | POA: Diagnosis not present

## 2017-05-18 DIAGNOSIS — M79631 Pain in right forearm: Secondary | ICD-10-CM | POA: Diagnosis not present

## 2017-05-18 NOTE — Discharge Instructions (Signed)
Apply cold compresses or ice to the area of pain. Wear the thumb spica splint all at work and other times that you are having pain. May take ibuprofen or Aleve to help with pain. If possible try to change the way your hand and digits are working with your job to off set the repetitive use of your thumb. May also want explained to your supervisor that you are having pain with the repetitive use of your job. Sometimes there are solutions to these type problems that they can offer.

## 2017-05-18 NOTE — ED Provider Notes (Signed)
[facility]    CSN: [id number] Arrival date & time: 05/18/17  1624     History   Chief Complaint Chief Complaint  Patient presents with  . Arm Pain    HPI Candice White is a 26 y.o. female.   26 year old female has a job in which requires repetitive use of her hands, digits or wrist. Today she was noticing that there was sharp pain radiating from the thumb up to the forearm. She has a specific area of the proximal phalanx of the right thumb that when touched a sharp shooting pain radiates to the forearm. Denies blunt trauma or known injury.      Past Medical History:  Diagnosis Date  . BV (bacterial vaginosis)   . Migraine   . PID (acute pelvic inflammatory disease)     Patient Active Problem List   Diagnosis Date Noted  . Dizziness 05/23/2016  . Abdominal pain, chronic, right lower quadrant 05/23/2016  . Vaginal discharge 11/30/2015  . Birth control counseling 11/30/2015  . Normal labor 05/25/2015  . Normal vaginal delivery 05/25/2015  . Irregular uterine contractions 05/20/2015  . Migraine headache 05/20/2015  . Abdominal pain, acute, right lower quadrant 11/21/2014    Past Surgical History:  Procedure Laterality Date  . NO PAST SURGERIES      OB History    Gravida Para Term Preterm AB Living   1 1 1     1    SAB TAB Ectopic Multiple Live Births         0 1       Home Medications    Prior to Admission medications   Medication Sig Start Date End Date Taking? Authorizing Provider  ibuprofen (ADVIL,MOTRIN) 600 MG tablet Take 1 tablet (600 mg total) by mouth every 6 (six) hours as needed. 05/27/15   Gerrit Heck, CNM  valACYclovir (VALTREX) 1000 MG tablet Take 2 tablets (2,000 mg total) by mouth every 12 (twelve) hours. For 1 day 01/16/16   Latrelle Dodrill, MD    Family History Family History  Problem Relation Age of Onset  . Cancer Maternal Aunt        lung cancer  . Cancer Cousin        lung  . Hypertension Mother     Social  History Social History  Substance Use Topics  . Smoking status: Former Smoker    Years: 3.00    Quit date: 12/08/2012  . Smokeless tobacco: Former Neurosurgeon    Quit date: 10/02/2014  . Alcohol use No     Comment: in past     Allergies   Patient has no known allergies.   Review of Systems Review of Systems  Constitutional: Negative.  Negative for activity change, chills and fever.  HENT: Negative.   Respiratory: Negative.   Cardiovascular: Negative.   Musculoskeletal:       As per HPI  Skin: Negative for color change, pallor and rash.  Neurological: Negative.   All other systems reviewed and are negative.    Physical Exam Triage Vital Signs ED Triage Vitals  Enc Vitals Group     BP 05/18/17 1637 (!) 96/57     Pulse Rate 05/18/17 1637 63     Resp 05/18/17 1637 16     Temp 05/18/17 1637 98.4 F (36.9 C)     Temp Source 05/18/17 1637 Oral     SpO2 05/18/17 1637 100 %     Weight --      Height --  Head Circumference --      Peak Flow --      Pain Score 05/18/17 1634 10     Pain Loc --      Pain Edu? --      Excl. in GC? --    No data found.   Updated Vital Signs BP (!) 96/57 (BP Location: Right Arm)   Pulse 63   Temp 98.4 F (36.9 C) (Oral)   Resp 16   SpO2 100%   Visual Acuity Right Eye Distance:   Left Eye Distance:   Bilateral Distance:    Right Eye Near:   Left Eye Near:    Bilateral Near:     Physical Exam  Constitutional: She is oriented to person, place, and time. She appears well-developed and well-nourished. No distress.  HENT:  Head: Normocephalic and atraumatic.  Eyes: EOM are normal.  Neck: Neck supple.  Musculoskeletal: Normal range of motion. She exhibits tenderness. She exhibits no edema or deformity.  No swelling to the digits, wrist or forearm. Negative Finkelstein sign. No tenderness along the extensor pollicis longus. There is tenderness along other tendons from the digits and the forearm. Distal neurovascular motor sensory is  grossly intact. Movement of the fingers including grasp flexion and extension are fully intact. Capillary refill is brisk. Extension of the wrist against resistance does not cause pain however flexion against resistance does cause some sharp pain rating along the volar aspect of the forearm. No injury is noted. No skin changes.  Neurological: She is alert and oriented to person, place, and time. No cranial nerve deficit.  Skin: Skin is warm and dry.     UC Treatments / Results  Labs (all labs ordered are listed, but only abnormal results are displayed) Labs Reviewed - No data to display  EKG  EKG Interpretation None       Radiology No results found.  Procedures Procedures (including critical care time)  Medications Ordered in UC Medications - No data to display   Initial Impression / Assessment and Plan / UC Course  I have reviewed the triage vital signs and the nursing notes.  Pertinent labs & imaging results that were available during my care of the patient were reviewed by me and considered in my medical decision making (see chart for details).     Apply cold compresses or ice to the area of pain. Wear the thumb spica splint all at work and other times that you are having pain. May take ibuprofen or Aleve to help with pain. If possible try to change the way your hand and digits are working with your job to off set the repetitive use of your thumb. May also want explained to your supervisor that you are having pain with the repetitive use of your job. Sometimes there are solutions to these type problems that they can offer.   Final Clinical Impressions(s) / UC Diagnoses   Final diagnoses:  Pain of right forearm  Tendinitis   Thumb spica splint New Prescriptions Current Discharge Medication List       Controlled Substance Prescriptions [place] Controlled Substance Registry consulted? Not Applicable   Hayden Rasmussen, NP 05/18/17 1715

## 2017-05-18 NOTE — ED Triage Notes (Addendum)
Patient reports pain in right arm.  Pain is sharp.  No known injury.  Pain is in right radial side of hand and rotates around forearm.  No pain above elbow Patient is right handed.    Patient works-packing-packs small jars

## 2017-05-27 ENCOUNTER — Encounter: Payer: Self-pay | Admitting: Internal Medicine

## 2017-05-27 ENCOUNTER — Ambulatory Visit (INDEPENDENT_AMBULATORY_CARE_PROVIDER_SITE_OTHER): Payer: Medicaid Other | Admitting: Internal Medicine

## 2017-05-27 ENCOUNTER — Other Ambulatory Visit (HOSPITAL_COMMUNITY)
Admission: RE | Admit: 2017-05-27 | Discharge: 2017-05-27 | Disposition: A | Discharge status: Home / Self Care | Payer: Medicaid Other | Source: Ambulatory Visit | Attending: Family Medicine | Admitting: Family Medicine

## 2017-05-27 VITALS — BP 108/60 | HR 71 | Temp 98.2°F | Ht 63.0 in | Wt 109.0 lb

## 2017-05-27 DIAGNOSIS — F320 Major depressive disorder, single episode, mild: Secondary | ICD-10-CM

## 2017-05-27 DIAGNOSIS — Z124 Encounter for screening for malignant neoplasm of cervix: Secondary | ICD-10-CM | POA: Diagnosis present

## 2017-05-27 DIAGNOSIS — F411 Generalized anxiety disorder: Secondary | ICD-10-CM | POA: Diagnosis not present

## 2017-05-27 MED ORDER — SERTRALINE HCL 50 MG PO TABS: 50.0000 mg | ORAL_TABLET | Freq: Every day | ORAL | 0 refills | Status: DC

## 2017-05-27 NOTE — Progress Notes (Signed)
   [facility] Phone: [phone]  Subjective:  Candice White is a 26 year old female presenting to clinic for pap smear and to discuss anxiety/depression.  Pap smear: Has never had an abnormal pap. Denies vaginal discharge or vaginal lesions. Would like to have routine STD testing today. She is sexually active with one female partner for the last few years. LMP was 05/19/17. Not on any birth control. Not using condoms. States she would be fine if she became pregnant.  Depression/anxiety: Has been going on for the last 2 years. Started right after her son was born. 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 She states it was very severe when it first began. The depression has gotten better since then, but has still persisted. She often feels depressed and like she lacks motivation to do things. She has lost interest in things that she used to love. She often feels very tired. She has also been having issues with concentration. No SI/HI. She states she has a friend who is a Engineer, civil (consulting) who she confides in a lot. She considers her conversations with her friend to be like therapy. She has also been having anxiety. She feels like she worries all the time. Most of her worries are about her son. She also notes that she "snaps" easily and becomes annoyed during times where she knows she shouldn't be annoyed.   ROS: See HPI for pertinent positives and negatives  Past Medical History- migraines, hx PID  Family history reviewed for today's visit. No changes.  Social history- patient is a former smoker, quit in 2014.  Objective: BP 108/60   Pulse 71   Temp 98.2 F (36.8 C) (Oral)   Ht 5\' 3"  (1.6 m)   Wt 109 lb (49.4 kg)   LMP 05/19/2017 (Approximate)   SpO2 99%   BMI 19.31 kg/m  Gen: NAD, alert, cooperative with exam GU: External genitalia normal in appearance, no vaginal lesions, small amount of clear/white vaginal discharge present, cervix normal, bimanual exam normal without  any masses, no cervical motion tenderness Psych: Normal rate of speech, appropriate affect, normal thought content, normal judgement  PHQ-9: 16 GAD-7: 15  Assessment/Plan: Encounter for cervical cancer screening: Pap performed today. - Gonorrhea and chlamydia performed - Will obtain HIV and RPR at next visit, lab was already closed - Patient not interested in starting birth control today, states she would be fine with getting pregnant. - Will call patient with results  Depression: PHQ-9 was a 16. Has been going on for 2 years, ever since her son was born. - Start Zoloft 50mg  daily - Patient given information regarding therapists that accept Medicaid in [place] - Discussed the importance of both medication and therapy in the treatment of depression and anxiety. - Counseled that it will take 4-6 weeks for medication to take maximum effect - Follow-up in 6 weeks. Will repeat PHQ-9 at that visit. May need to increase dose of Zoloft.  Anxiety: GAD-7 was a 15.  - Start Zoloft - Patient given information on therapists that accept [employer]. - Follow-up in 6 weeks for repeat GAD-7.    Willadean Carol, MD PGY-3

## 2017-05-27 NOTE — Patient Instructions (Addendum)
It was so nice to meet you!  I will call you with your lab results!  I have started a medication called Zoloft. Please take 1 tablet daily. Please follow-up with your primary care doctor in 6 weeks so that we can make sure you are doing better.  -Dr. Nancy Marus

## 2017-05-28 DIAGNOSIS — F32A Depression, unspecified: Secondary | ICD-10-CM | POA: Insufficient documentation

## 2017-05-28 DIAGNOSIS — F411 Generalized anxiety disorder: Secondary | ICD-10-CM | POA: Insufficient documentation

## 2017-05-28 DIAGNOSIS — Z124 Encounter for screening for malignant neoplasm of cervix: Secondary | ICD-10-CM | POA: Insufficient documentation

## 2017-05-28 DIAGNOSIS — F329 Major depressive disorder, single episode, unspecified: Secondary | ICD-10-CM | POA: Insufficient documentation

## 2017-05-28 NOTE — Assessment & Plan Note (Signed)
GAD-7 was a 15.  - Start Zoloft - Patient given information on therapists that accept [employer]. - Follow-up in 6 weeks for repeat GAD-7.

## 2017-05-28 NOTE — Assessment & Plan Note (Signed)
PHQ-9 was a 16. Has been going on for 2 years, ever since her son was born. - Start Zoloft 50mg  daily - Patient given information regarding therapists [place] - Discussed the importance of both medication and therapy in the treatment of depression and anxiety. - Counseled that it will take 4-6 weeks for medication to take maximum effect - Follow-up in 6 weeks. Will repeat PHQ-9 at that visit. May need to increase dose of Zoloft.

## 2017-05-29 ENCOUNTER — Encounter: Payer: Self-pay | Admitting: *Deleted

## 2017-05-29 LAB — CYTOLOGY - PAP
DIAGNOSIS: NEGATIVE
HPV (WINDOPATH): NOT DETECTED

## 2017-05-29 NOTE — Progress Notes (Signed)
Letter mailed to patient with normal results. Jazmin Hartsell,CMA  

## 2017-09-30 NOTE — L&D Delivery Note (Signed)
Delivery Note At 2:51 PM a viable female was delivered via Vaginal, Spontaneous (Presentation: LOA;  ).  APGAR: 8, 9; weight pending .   Placenta status: spontaneous, intact, .  3VC.   Anesthesia:  Epidural Episiotomy: None Lacerations:  None QBL 614  Mom to postpartum.  Baby to Couplet care / Skin to Skin    Candice White is a P1 at term who presented yesterday with PROM. She thinks she ruptured around 0400 yesterday AM, possibly the previous evening. Amnisure positive. Was admitted and augmented with pitocin. She received epidural for comfort. Some slow, but steady progress through active labor. Baby OP. Position changes and attempted digital rotation brought her to c/c/+1. She pushed with strong urges for about for SVD vigorous female. Nuchal x2 reduced prior to birth of body. Newborn initially quiet, but good tone and HR. Bulb suctioned mouth and nares and dried and stimulated on maternal abdomen where she began to cry well. Cord clamped and cut by FOB Candice White after it stopped pulsing. Cord blood collected. Placenta delivered spontaneous, intact, 3VC. Discarded. Vagina/perineum inspected and found to be intact. Fundus firm. Small lochia. Pitocin for active 3rd stage. Candice White and [name] stable in LDRP with family at bedside. Dr Mora Appl aware.  Candice White 08/08/2018, 3:09 PM

## 2017-10-08 ENCOUNTER — Ambulatory Visit: Payer: Self-pay | Admitting: Internal Medicine

## 2017-10-22 ENCOUNTER — Ambulatory Visit: Payer: Self-pay | Admitting: Internal Medicine

## 2017-10-27 ENCOUNTER — Ambulatory Visit: Payer: Self-pay

## 2017-12-09 ENCOUNTER — Encounter: Payer: Self-pay | Admitting: Internal Medicine

## 2017-12-09 ENCOUNTER — Other Ambulatory Visit: Payer: Self-pay

## 2017-12-09 ENCOUNTER — Ambulatory Visit (INDEPENDENT_AMBULATORY_CARE_PROVIDER_SITE_OTHER): Payer: Self-pay | Admitting: Internal Medicine

## 2017-12-09 DIAGNOSIS — F411 Generalized anxiety disorder: Secondary | ICD-10-CM

## 2017-12-09 DIAGNOSIS — F320 Major depressive disorder, single episode, mild: Secondary | ICD-10-CM

## 2017-12-09 MED ORDER — VENLAFAXINE HCL ER 37.5 MG PO CP24: 37.5000 mg | ORAL_CAPSULE | Freq: Every day | ORAL | 0 refills | Status: DC

## 2017-12-09 NOTE — Patient Instructions (Signed)
It was so nice to see you today!   I have started a medication called Effexor to help with your anxiety and depression. Please take 1 tablet daily for 1 week, then take 2 tablets daily (at the same time).  We will see you back in clinic in 6 weeks.  -Dr. Nancy MarusMayo

## 2017-12-10 NOTE — Progress Notes (Addendum)
   [facility] Phone: [phone]  Subjective:  Candice White is a 27 year old female presenting to clinic with depression/anxiety. She was last seen in clinic 05/27/17. She was prescribed Zoloft 50mg  daily at the time. She took it for a couple of days and then stopped it because it caused her to feel nauseated. She tried taking it with food, but it didn't help. She states she is anxious every day. She wakes up "with a feeling of dread". She states she gets very stressed out at work. She can't stop worrying. When she feels anxious, she doesn't know how to calm herself down. Her partner will suggest that she turn the lights off take deep breaths in bed, or take a warm bath, or go for a walk. All of these things help her anxiety. She also notes fatigue and lack of energy on most days. No SI/HI.  ROS: See HPI for pertinent positives and negatives  Past Medical History- migraines, depression, GAD  Family history reviewed for today's visit. No changes.  Social history- patient is a former smoker,quit in 2014  Objective: BP 100/61   Pulse 73   Temp 97.9 F (36.6 C) (Oral)   Wt 118 lb (53.5 kg)   LMP 11/14/2017   SpO2 99%   BMI 20.90 kg/m  Gen: NAD, alert Psych: Occasionally tearful, normal rate of speech, normal thought content, normal behavior, no SI/HI.  PHQ-9: 15 GAD-7: 18  Assessment/Plan: Generalized Anxiety Disorder: Uncontrolled. GAD-7 is an 3718 today. Unable to tolerate Zoloft due to nausea. - Start Effexor 37.5mg  daily x 1 week, then increase to 75mg  daily - Patient interested in therapy- gave patient contact information for different therapy [place] - Follow-up in 6 weeks  Depression: Uncontrolled PHQ-9 is a 15. - Start Effexor as above   Candice CarolKaty Xyla Leisner, MD PGY-3

## 2017-12-10 NOTE — Assessment & Plan Note (Signed)
Uncontrolled PHQ-9 is a 15. - Start Effexor as above

## 2017-12-10 NOTE — Assessment & Plan Note (Addendum)
Uncontrolled. GAD-7 is an 6518 today. Unable to tolerate Zoloft due to nausea. - Start Effexor 37.5mg  daily x 1 week, then increase to 75mg  daily - Patient interested in therapy- gave patient contact information for different [place] offices [place] - Follow-up in 6 weeks

## 2017-12-20 ENCOUNTER — Encounter (HOSPITAL_COMMUNITY): Payer: Self-pay | Admitting: Family Medicine

## 2017-12-20 ENCOUNTER — Ambulatory Visit (HOSPITAL_COMMUNITY)
Admission: EM | Admit: 2017-12-20 | Discharge: 2017-12-20 | Disposition: A | Discharge status: Home / Self Care | Payer: Medicaid Other | Attending: Family Medicine | Admitting: Family Medicine

## 2017-12-20 DIAGNOSIS — N898 Other specified noninflammatory disorders of vagina: Secondary | ICD-10-CM | POA: Diagnosis present

## 2017-12-20 DIAGNOSIS — R110 Nausea: Secondary | ICD-10-CM | POA: Diagnosis present

## 2017-12-20 DIAGNOSIS — Z87891 Personal history of nicotine dependence: Secondary | ICD-10-CM | POA: Diagnosis not present

## 2017-12-20 DIAGNOSIS — Z3201 Encounter for pregnancy test, result positive: Secondary | ICD-10-CM | POA: Insufficient documentation

## 2017-12-20 DIAGNOSIS — O210 Mild hyperemesis gravidarum: Secondary | ICD-10-CM | POA: Diagnosis not present

## 2017-12-20 LAB — POCT URINALYSIS DIP (DEVICE)
Bilirubin Urine: NEGATIVE
GLUCOSE, UA: NEGATIVE mg/dL
Hgb urine dipstick: NEGATIVE
KETONES UR: NEGATIVE mg/dL
NITRITE: NEGATIVE
PROTEIN: NEGATIVE mg/dL
Specific Gravity, Urine: 1.01 (ref 1.005–1.030)
UROBILINOGEN UA: 0.2 mg/dL (ref 0.0–1.0)
pH: 7 (ref 5.0–8.0)

## 2017-12-20 LAB — POCT PREGNANCY, URINE: PREG TEST UR: POSITIVE — AB

## 2017-12-20 MED ORDER — PRENATAL COMPLETE 14-0.4 MG PO TABS: 1.0000 | ORAL_TABLET | Freq: Every day | ORAL | 0 refills | Status: DC

## 2017-12-20 NOTE — Discharge Instructions (Addendum)
For morning sickness: Doxylamine is available in some over-the-counter sleeping pills (eg, Unisom Sleep Tabs): One-half of the 25 mg over-the-counter tablet or two chewable 5 mg tablets can be used off-label as an antiemetic. In addition, pyridoxine (vitamin B6) 25 mg, also available over-the-counter, can be taken three or four times per day along with 12.5 mg of doxylamine   Start prenatal vitamin. We will call you with any positive test results in regards to your vaginal discharge. Please make an appointment with your OB for follow up. Tylenol as needed for pain, do not use ibuprofen. If develop pain, bleeding, dehydration or otherwise concerned please return or [facility].

## 2017-12-20 NOTE — ED Provider Notes (Signed)
[facility]    CSN: [id number] Arrival date & time: 12/20/17  1515     History   Chief Complaint Chief Complaint  Patient presents with  . Nausea  . Vaginal Discharge    HPI Candice White is a 27 y.o. female.   Candice White presents with complaints of nausea for the past week as well as increased vaginal discharge. Without vomiting diarrhea abdominal pain or fevers. States the vaginal discharge is white/green toned. Denies concern for STD's. It does not itch, and it is without odor. Denies vaginal bleeding. Has irregular periods. She states her last period was in February but does not known when specifically. That can be normal for her. States nausea is worse in mornings. Has been eating some but drinking fine. Has had 1 previous pregnancy. Breast tenderness as well. Hx of BV and yeast infections. Denies any previous std's. History of depression, she is currently not taking any medications for this.     ROS per HPI.      Past Medical History:  Diagnosis Date  . BV (bacterial vaginosis)   . Migraine   . PID (acute pelvic inflammatory disease)     Patient Active Problem List   Diagnosis Date Noted  . Depression 05/28/2017  . Generalized anxiety disorder 05/28/2017  . Migraine headache 05/20/2015    Past Surgical History:  Procedure Laterality Date  . NO PAST SURGERIES      OB History    Gravida  1   Para  1   Term  1   Preterm      AB      Living  1     SAB      TAB      Ectopic      Multiple  0   Live Births  1            Home Medications    Prior to Admission medications   Medication Sig Start Date End Date Taking? Authorizing Provider  Prenatal Vit-Fe Fumarate-FA (PRENATAL COMPLETE) 14-0.4 MG TABS Take 1 tablet by mouth daily. 12/20/17   Georgetta Haber, NP    Family History Family History  Problem Relation Age of Onset  . Cancer Maternal Aunt        lung cancer  . Cancer Cousin        lung  . Hypertension Mother      Social History Social History   Tobacco Use  . Smoking status: Former Smoker    Years: 3.00    Last attempt to quit: 12/08/2012    Years since quitting: 5.0  . Smokeless tobacco: Former Neurosurgeon    Quit date: 10/02/2014  Substance Use Topics  . Alcohol use: No    Comment: in past  . Drug use: No    Types: Marijuana    Comment: Quit on 10/02/2014 when finding out she was preg.     Allergies   Patient has no known allergies.   Review of Systems Review of Systems   Physical Exam Triage Vital Signs ED Triage Vitals  Enc Vitals Group     BP 12/20/17 1634 (!) 90/49     Pulse Rate 12/20/17 1634 73     Resp 12/20/17 1634 18     Temp 12/20/17 1634 98.2 F (36.8 C)     Temp src --      SpO2 12/20/17 1634 100 %     Weight --      Height --  Head Circumference --      Peak Flow --      Pain Score 12/20/17 1632 0     Pain Loc --      Pain Edu? --      Excl. in GC? --    No data found.  Updated Vital Signs BP (!) 90/49   Pulse 73   Temp 98.2 F (36.8 C)   Resp 18   SpO2 100%   Visual Acuity Right Eye Distance:   Left Eye Distance:   Bilateral Distance:    Right Eye Near:   Left Eye Near:    Bilateral Near:     Physical Exam  Constitutional: She is oriented to person, place, and time. She appears well-developed and well-nourished. No distress.  Cardiovascular: Normal rate, regular rhythm and normal heart sounds.  Pulmonary/Chest: Effort normal and breath sounds normal.  Abdominal: Soft. She exhibits no distension. There is no tenderness. There is no guarding and no CVA tenderness.  Denies lesions or bleeding, gu deferred at this time. Without pain.   Neurological: She is alert and oriented to person, place, and time.  Skin: Skin is warm and dry.     UC Treatments / Results  Labs (all labs ordered are listed, but only abnormal results are displayed) Labs Reviewed  POCT URINALYSIS DIP (DEVICE) - Abnormal; Notable for the following components:       Result Value   Leukocytes, UA MODERATE (*)    All other components within normal limits  POCT PREGNANCY, URINE - Abnormal; Notable for the following components:   Preg Test, Ur POSITIVE (*)    All other components within normal limits  URINE CULTURE  URINE CYTOLOGY ANCILLARY ONLY    EKG None Radiology No results found.  Procedures Procedures (including critical care time)  Medications Ordered in UC Medications - No data to display   Initial Impression / Assessment and Plan / UC Course  I have reviewed the triage vital signs and the nursing notes.  Pertinent labs & imaging results that were available during my care of the patient were reviewed by me and considered in my medical decision making (see chart for details).     Urine sent for culture and cytology at this time. Will notify of any positive findings and if any changes to treatment are needed.  Positive pregnancy. Patient encouraged to start a prenatal, set up with OB for dating as unknown LMP. Morning sickness treatments discussed. Return precautions provided. Patient verbalized understanding and agreeable to plan.    Final Clinical Impressions(s) / UC Diagnoses   Final diagnoses:  Morning sickness  Positive pregnancy test  Vaginal discharge    ED Discharge Orders        Ordered    Prenatal Vit-Fe Fumarate-FA (PRENATAL COMPLETE) 14-0.4 MG TABS  Daily     12/20/17 1655       Controlled Substance Prescriptions [place] Controlled Substance Registry consulted? Not Applicable   Georgetta HaberBurky, Niya Behler B, NP 12/20/17 1701

## 2017-12-20 NOTE — ED Triage Notes (Signed)
Pt here for nausea and vaginal discharge since last week. sts some irritation.

## 2017-12-21 ENCOUNTER — Inpatient Hospital Stay (HOSPITAL_COMMUNITY)
Admission: AD | Admit: 2017-12-21 | Discharge: 2017-12-21 | Disposition: A | Discharge status: Home / Self Care | Payer: Medicaid Other | Source: Ambulatory Visit | Attending: Obstetrics and Gynecology | Admitting: Obstetrics and Gynecology

## 2017-12-21 ENCOUNTER — Other Ambulatory Visit: Payer: Self-pay | Admitting: Advanced Practice Midwife

## 2017-12-21 ENCOUNTER — Inpatient Hospital Stay (HOSPITAL_COMMUNITY): Payer: Medicaid Other

## 2017-12-21 ENCOUNTER — Encounter (HOSPITAL_COMMUNITY): Payer: Self-pay | Admitting: *Deleted

## 2017-12-21 DIAGNOSIS — O209 Hemorrhage in early pregnancy, unspecified: Secondary | ICD-10-CM | POA: Diagnosis not present

## 2017-12-21 DIAGNOSIS — O98811 Other maternal infectious and parasitic diseases complicating pregnancy, first trimester: Secondary | ICD-10-CM | POA: Insufficient documentation

## 2017-12-21 DIAGNOSIS — Z3A01 Less than 8 weeks gestation of pregnancy: Secondary | ICD-10-CM | POA: Diagnosis not present

## 2017-12-21 DIAGNOSIS — Z3491 Encounter for supervision of normal pregnancy, unspecified, first trimester: Secondary | ICD-10-CM

## 2017-12-21 DIAGNOSIS — B373 Candidiasis of vulva and vagina: Secondary | ICD-10-CM | POA: Diagnosis not present

## 2017-12-21 DIAGNOSIS — O208 Other hemorrhage in early pregnancy: Secondary | ICD-10-CM

## 2017-12-21 DIAGNOSIS — B3731 Acute candidiasis of vulva and vagina: Secondary | ICD-10-CM

## 2017-12-21 LAB — URINE CULTURE: Culture: 50000 — AB

## 2017-12-21 LAB — URINALYSIS, ROUTINE W REFLEX MICROSCOPIC
Bilirubin Urine: NEGATIVE
Glucose, UA: NEGATIVE mg/dL
Hgb urine dipstick: NEGATIVE
KETONES UR: NEGATIVE mg/dL
Nitrite: NEGATIVE
PROTEIN: NEGATIVE mg/dL
Specific Gravity, Urine: 1.024 (ref 1.005–1.030)
pH: 7 (ref 5.0–8.0)

## 2017-12-21 LAB — CBC
HCT: 33.7 % — ABNORMAL LOW (ref 36.0–46.0)
Hemoglobin: 11.8 g/dL — ABNORMAL LOW (ref 12.0–15.0)
MCH: 31 pg (ref 26.0–34.0)
MCHC: 35 g/dL (ref 30.0–36.0)
MCV: 88.5 fL (ref 78.0–100.0)
PLATELETS: 317 10*3/uL (ref 150–400)
RBC: 3.81 MIL/uL — AB (ref 3.87–5.11)
RDW: 12.6 % (ref 11.5–15.5)
WBC: 11.3 10*3/uL — AB (ref 4.0–10.5)

## 2017-12-21 LAB — WET PREP, GENITAL
CLUE CELLS WET PREP: NONE SEEN
Sperm: NONE SEEN
Trich, Wet Prep: NONE SEEN
Yeast Wet Prep HPF POC: NONE SEEN

## 2017-12-21 LAB — POCT PREGNANCY, URINE: Preg Test, Ur: POSITIVE — AB

## 2017-12-21 LAB — HCG, QUANTITATIVE, PREGNANCY: hCG, Beta Chain, Quant, S: 72074 m[IU]/mL — ABNORMAL HIGH (ref <5)

## 2017-12-21 MED ORDER — MICONAZOLE NITRATE 2 % VA CREA: 1.0000 | TOPICAL_CREAM | Freq: Every day | VAGINAL | 0 refills | Status: DC

## 2017-12-21 NOTE — MAU Provider Note (Signed)
Chief Complaint: Vaginal Bleeding and Abdominal Pain   First Provider Initiated Contact with Patient 12/21/17 1633     SUBJECTIVE HPI: Candice White is a 27 y.o. G2P1001 at unknown weeks by unknown LMP in January or February who presents to Maternity Admissions reporting: light vaginal bleeding and mild cramping.  Vaginal Bleeding: Light Passage of tissue or clots: Denies Dizziness: Denies  B POS  Pain Location: suprapubic  Quality: cramping Severity: mild Duration: <24 hours Course: unchanged Context: Pos UPT Timing: intermittent Modifying factors: None. Hasn't tried anything for the pain Associated signs and symptoms: pos for spotting and greenish vaginal discharge. Neg for fever, chills, GI complaints or urinary complaints.   Past Medical History:  Diagnosis Date  . BV (bacterial vaginosis)   . Migraine   . PID (acute pelvic inflammatory disease)    OB History  Gravida Para Term Preterm AB Living  2 1 1     1   SAB TAB Ectopic Multiple Live Births        0 1    # Outcome Date GA Lbr Len/2nd Weight Sex Delivery Anes PTL Lv  2 Current           1 Term 05/25/15 [redacted]w[redacted]d 11:25 / 01:14 7 lb 3.7 oz (3.28 kg) M Vag-Spont EPI  LIV   Past Surgical History:  Procedure Laterality Date  . NO PAST SURGERIES     Social History   Socioeconomic History  . Marital status: Single    Spouse name: Not on file  . Number of children: Not on file  . Years of education: Not on file  . Highest education level: Not on file  Occupational History  . Not on file  Social Needs  . Financial resource strain: Not on file  . Food insecurity:    Worry: Not on file    Inability: Not on file  . Transportation needs:    Medical: Not on file    Non-medical: Not on file  Tobacco Use  . Smoking status: Former Smoker    Years: 3.00    Last attempt to quit: 12/08/2012    Years since quitting: 5.0  . Smokeless tobacco: Former Neurosurgeon    Quit date: 10/02/2014  Substance and Sexual Activity  .  Alcohol use: No    Comment: in past  . Drug use: No    Types: Marijuana    Comment: Quit on 10/02/2014 when finding out she was preg.  Marland Kitchen Sexual activity: Yes    Birth control/protection: None  Lifestyle  . Physical activity:    Days per week: Not on file    Minutes per session: Not on file  . Stress: Not on file  Relationships  . Social connections:    Talks on phone: Not on file    Gets together: Not on file    Attends religious service: Not on file    Active member of club or organization: Not on file    Attends meetings of clubs or organizations: Not on file    Relationship status: Not on file  . Intimate partner violence:    Fear of current or ex partner: Not on file    Emotionally abused: Not on file    Physically abused: Not on file    Forced sexual activity: Not on file  Other Topics Concern  . Not on file  Social History Narrative  . Not on file   No current facility-administered medications on file prior to encounter.  Current Outpatient Medications on File Prior to Encounter  Medication Sig Dispense Refill  . Prenatal Vit-Fe Fumarate-FA (PRENATAL COMPLETE) 14-0.4 MG TABS Take 1 tablet by mouth daily. 60 each 0   No Known Allergies  I have reviewed the past Medical Hx, Surgical Hx, Social Hx, Allergies and Medications.   Review of Systems  Constitutional: Negative for chills and fever.  Gastrointestinal: Positive for abdominal pain. Negative for constipation, diarrhea, nausea and vomiting.  Genitourinary: Positive for vaginal bleeding and vaginal discharge. Negative for dysuria and vaginal pain.  Neurological: Negative for dizziness.    OBJECTIVE Patient Vitals for the past 24 hrs:  BP Temp Temp src Pulse Resp Height Weight  12/21/17 1550 (!) 114/58 98.1 F (36.7 C) Oral 77 17 - -  12/21/17 1535 - - - - - 5\' 3"  (1.6 m) 115 lb 14.4 oz (52.6 kg)   Constitutional: Well-developed, well-nourished female in no acute distress.  Cardiovascular: normal  rate Respiratory: normal rate and effort.  GI: Abd soft, non-tender. Pos BS x 4 MS: Extremities nontender, no edema, normal ROM Neurologic: Alert and oriented x 4.  GU: Neg CVAT.  SPECULUM EXAM: NEFG, moderate amount of thick, white, odorless discharge, no blood noted, cervix clean  BIMANUAL: cervix closed; uterus top-normal size, no adnexal tenderness or masses. No CMT.  LAB RESULTS Results for orders placed or performed during the hospital encounter of 12/21/17 (from the past 24 hour(s))  Urinalysis, Routine w reflex microscopic     Status: Abnormal   Collection Time: 12/21/17  3:30 PM  Result Value Ref Range   Color, Urine YELLOW YELLOW   APPearance CLEAR CLEAR   Specific Gravity, Urine 1.024 1.005 - 1.030   pH 7.0 5.0 - 8.0   Glucose, UA NEGATIVE NEGATIVE mg/dL   Hgb urine dipstick NEGATIVE NEGATIVE   Bilirubin Urine NEGATIVE NEGATIVE   Ketones, ur NEGATIVE NEGATIVE mg/dL   Protein, ur NEGATIVE NEGATIVE mg/dL   Nitrite NEGATIVE NEGATIVE   Leukocytes, UA SMALL (A) NEGATIVE   RBC / HPF 0-5 0 - 5 RBC/hpf   WBC, UA 0-5 0 - 5 WBC/hpf   Bacteria, UA RARE (A) NONE SEEN   Squamous Epithelial / LPF 0-5 (A) NONE SEEN   Mucus PRESENT   hCG, quantitative, pregnancy     Status: Abnormal   Collection Time: 12/21/17  3:45 PM  Result Value Ref Range   hCG, Beta Chain, Quant, S 72,074 (H) <5 mIU/mL  CBC     Status: Abnormal   Collection Time: 12/21/17  3:45 PM  Result Value Ref Range   WBC 11.3 (H) 4.0 - 10.5 K/uL   RBC 3.81 (L) 3.87 - 5.11 MIL/uL   Hemoglobin 11.8 (L) 12.0 - 15.0 g/dL   HCT 78.233.7 (L) 95.636.0 - 21.346.0 %   MCV 88.5 78.0 - 100.0 fL   MCH 31.0 26.0 - 34.0 pg   MCHC 35.0 30.0 - 36.0 g/dL   RDW 08.612.6 57.811.5 - 46.915.5 %   Platelets 317 150 - 400 K/uL  Pregnancy, urine POC     Status: Abnormal   Collection Time: 12/21/17  3:52 PM  Result Value Ref Range   Preg Test, Ur POSITIVE (A) NEGATIVE  Wet prep, genital     Status: Abnormal   Collection Time: 12/21/17  4:00 PM  Result Value  Ref Range   Yeast Wet Prep HPF POC NONE SEEN NONE SEEN   Trich, Wet Prep NONE SEEN NONE SEEN   Clue Cells Wet Prep HPF POC NONE  SEEN NONE SEEN   WBC, Wet Prep HPF POC MANY (A) NONE SEEN   Sperm NONE SEEN     IMAGING US Ob Comp Less 14 Wks  Result Date: 12/21/2017 CLINICAL DATA:  Bleeding since December 20, 2017 EXAM: OBSTETRIC <14 WK [place] AND TRANSVAGINAL OB US TECHNIQUE: Both transabdominal and transvaginal ultrasound examinations were performed for complete evaluation of the gestation as well as the maternal uterus, adnexal regions, and pelvic cul-de-sac. Transvaginal technique was performed to assess early pregnancy. COMPARISON:  None. FINDINGS: Intrauterine gestational sac: Single Yolk sac:  Visualized. Embryo:  Visualized. Cardiac Activity: Visualized. Heart Rate: 129 bpm MSD:   mm    w     d CRL:  6.5 mm   6 w   3 d                  [place] [facility]: August 13, 2018 Subchorionic hemorrhage:  None Maternal uterus/adnexae: The left ovary is normal. The right ovary contains a 4.2 x 3.2 x 3.8 cm simple cyst/follicle. IMPRESSION: Single live IUP.  Dominant 4.2 cm follicle in the right ovary. Electronically Signed   By: Gerome Sam III M.D   On: 12/21/2017 17:40   US Ob Transvaginal  Result Date: 12/21/2017 CLINICAL DATA:  Bleeding since December 20, 2017 EXAM: OBSTETRIC <14 WK [place] AND TRANSVAGINAL OB US TECHNIQUE: Both transabdominal and transvaginal ultrasound examinations were performed for complete evaluation of the gestation as well as the maternal uterus, adnexal regions, and pelvic cul-de-sac. Transvaginal technique was performed to assess early pregnancy. COMPARISON:  None. FINDINGS: Intrauterine gestational sac: Single Yolk sac:  Visualized. Embryo:  Visualized. Cardiac Activity: Visualized. Heart Rate: 129 bpm MSD:   mm    w     d CRL:  6.5 mm   6 w   3 d                  [place] [facility]: August 13, 2018 Subchorionic hemorrhage:  None Maternal uterus/adnexae: The left ovary is normal. The right ovary contains a  4.2 x 3.2 x 3.8 cm simple cyst/follicle. IMPRESSION: Single live IUP.  Dominant 4.2 cm follicle in the right ovary. Electronically Signed   By: Gerome Sam III M.D   On: 12/21/2017 17:40    MAU COURSE CBC, Quant, ABO/Rh, ultrasound, wet prep and GC/chlamydia culture, UA  MDM - Pain and bleeding in early pregnancy with normal intrauterine pregnancy and hemodynamically stable. - Simple right ovarian cyst  ASSESSMENT 1. Normal IUP (intrauterine pregnancy) on prenatal ultrasound, first trimester   2. Vaginal bleeding affecting early pregnancy   3. Vaginal yeast infection     PLAN Discharge home in stable condition. First trimester precautions List of providers given. Follow-up Information    Obstetrician of your choice Follow up.   Why:  Start prenatal care around 10-12 weeks.       [facility] AD[facility] Follow up.   Why:  in pregnancy emergencies Contact information: [address] [id number] mc [place] [address] [phone]         Allergies as of 12/21/2017   No Known Allergies     Medication List    TAKE these medications   miconazole 2 % vaginal cream Commonly known as:  MONISTAT 7 Place 1 Applicatorful vaginally at bedtime.   PRENATAL COMPLETE 14-0.4 MG Tabs Take 1 tablet by mouth daily.        Candice White, [place] 12/21/2017  5:40 PM  4

## 2017-12-21 NOTE — Discharge Instructions (Signed)
[facility]    [facility]       Phone: [phone]  [facility] [place] Phone: [phone]  [facility] [place]  Phone: [phone]  [facility] [place]  Phone: [phone]  [facility] [place]  Phone: [phone]  [facility]       Phone: [phone]  [facility]    Phone: [phone]   [facility] [place]    Phone: [phone]  [facility]    Phone: [phone]  [facility]    Phone: [phone]  [facility]    Phone: [phone]  [facility]       Phone: [phone]   [facility]  Phone: [phone]  [facility]    Phone: [phone]  [facility]   Phone: [phone]  [facility]      Phone: [phone]  [facility]    Phone: [phone]   Vaginal Bleeding During Pregnancy, First Trimester A small amount of bleeding (spotting) from the vagina is common in early pregnancy. Sometimes the bleeding is normal and is not a problem, and sometimes it is a sign of something serious. Be sure to tell your doctor about any bleeding from your vagina right away. Follow these instructions at home:  Watch your condition for any changes.  Follow your doctor's instructions about how active you can be.  If you are on bed rest: ? You may need to stay in bed and only get up to use the bathroom. ? You may be allowed to do some activities. ? If you need help, make plans for someone to help you.  Write down: ? The number of pads you use each day. ? How often you change pads. ? How soaked (saturated) your pads are.  Do not use tampons.  Do not  douche.  Do not have sex or orgasms until your doctor says it is okay.  If you pass any tissue from your vagina, save the tissue so you can show it to your doctor.  Only take medicines as told by your doctor.  Do not take aspirin because it can make you bleed.  Keep all follow-up visits as told by your doctor. Contact a doctor if:  You bleed from your vagina.  You have cramps.  You have labor pains.  You have a fever that does not go away after you take medicine. Get help right away if:  You have very bad cramps in your back or belly (abdomen).  You pass large clots or tissue from your vagina.  You bleed more.  You feel light-headed or weak.  You pass out (faint).  You have chills.  You are leaking fluid or have a gush of fluid from your vagina.  You pass out while pooping (having a bowel movement). This information is not intended to replace advice given to you by your health care provider. Make sure you discuss any questions you have with your health care provider. Document Released: 01/31/2014 Document Revised: 02/22/2016 Document Reviewed: 05/24/2013 Elsevier Interactive Patient Education  [employer]2018 Elsevier Inc.

## 2017-12-21 NOTE — MAU Note (Signed)
Pt stated that she had some bright red bleeding yesterday and went to urgent care and found out she was pregnant.  Today she started having some mild cramps this morning and saw more spotting.  Last intercourse around 12/08/2017.

## 2017-12-22 LAB — URINE CYTOLOGY ANCILLARY ONLY
Chlamydia: NEGATIVE
Neisseria Gonorrhea: NEGATIVE
Trichomonas: NEGATIVE

## 2017-12-22 LAB — GC/CHLAMYDIA PROBE AMP (~~LOC~~) NOT AT ARMC
CHLAMYDIA, DNA PROBE: NEGATIVE
Neisseria Gonorrhea: NEGATIVE

## 2017-12-22 LAB — HIV ANTIBODY (ROUTINE TESTING W REFLEX): HIV Screen 4th Generation wRfx: NONREACTIVE

## 2017-12-23 ENCOUNTER — Inpatient Hospital Stay (HOSPITAL_COMMUNITY)
Admission: AD | Admit: 2017-12-23 | Discharge: 2017-12-24 | Disposition: A | Discharge status: Home / Self Care | Payer: Medicaid Other | Source: Ambulatory Visit | Attending: Family Medicine | Admitting: Family Medicine

## 2017-12-23 DIAGNOSIS — R1030 Lower abdominal pain, unspecified: Secondary | ICD-10-CM | POA: Insufficient documentation

## 2017-12-23 DIAGNOSIS — B373 Candidiasis of vulva and vagina: Secondary | ICD-10-CM

## 2017-12-23 DIAGNOSIS — O98811 Other maternal infectious and parasitic diseases complicating pregnancy, first trimester: Secondary | ICD-10-CM | POA: Insufficient documentation

## 2017-12-23 DIAGNOSIS — Z87891 Personal history of nicotine dependence: Secondary | ICD-10-CM | POA: Insufficient documentation

## 2017-12-23 DIAGNOSIS — O200 Threatened abortion: Secondary | ICD-10-CM | POA: Insufficient documentation

## 2017-12-23 DIAGNOSIS — O98819 Other maternal infectious and parasitic diseases complicating pregnancy, unspecified trimester: Secondary | ICD-10-CM

## 2017-12-23 DIAGNOSIS — Z3A01 Less than 8 weeks gestation of pregnancy: Secondary | ICD-10-CM | POA: Insufficient documentation

## 2017-12-23 DIAGNOSIS — O4690 Antepartum hemorrhage, unspecified, unspecified trimester: Secondary | ICD-10-CM

## 2017-12-23 LAB — URINE CYTOLOGY ANCILLARY ONLY
Bacterial vaginitis: POSITIVE — AB
Candida vaginitis: NEGATIVE

## 2017-12-24 ENCOUNTER — Encounter (HOSPITAL_COMMUNITY): Payer: Self-pay

## 2017-12-24 DIAGNOSIS — R1030 Lower abdominal pain, unspecified: Secondary | ICD-10-CM | POA: Diagnosis present

## 2017-12-24 DIAGNOSIS — O200 Threatened abortion: Secondary | ICD-10-CM

## 2017-12-24 DIAGNOSIS — Z87891 Personal history of nicotine dependence: Secondary | ICD-10-CM | POA: Diagnosis not present

## 2017-12-24 DIAGNOSIS — B373 Candidiasis of vulva and vagina: Secondary | ICD-10-CM | POA: Diagnosis not present

## 2017-12-24 DIAGNOSIS — Z3A01 Less than 8 weeks gestation of pregnancy: Secondary | ICD-10-CM | POA: Diagnosis not present

## 2017-12-24 DIAGNOSIS — O98811 Other maternal infectious and parasitic diseases complicating pregnancy, first trimester: Secondary | ICD-10-CM | POA: Diagnosis not present

## 2017-12-24 LAB — URINALYSIS, ROUTINE W REFLEX MICROSCOPIC
BACTERIA UA: NONE SEEN
BILIRUBIN URINE: NEGATIVE
Glucose, UA: NEGATIVE mg/dL
HGB URINE DIPSTICK: NEGATIVE
Ketones, ur: NEGATIVE mg/dL
NITRITE: NEGATIVE
PH: 7 (ref 5.0–8.0)
Protein, ur: NEGATIVE mg/dL
SPECIFIC GRAVITY, URINE: 1.004 — AB (ref 1.005–1.030)

## 2017-12-24 NOTE — Discharge Instructions (Signed)

## 2017-12-24 NOTE — MAU Provider Note (Signed)
History     CSN: [id number]  Arrival date and time: 12/23/17 2322   First Provider Initiated Contact with Patient 12/24/17 0058     Chief Complaint  Patient presents with  . Abdominal Pain  . Vaginal Bleeding   HPI Candice White is a 27 y.o. G2P1001 at 4534w6d who presents with spotting when she wiped and lower abdominal cramping. She states she noticed the spotting tonight on the tissue after using the bathroom. She states she also has cramping like at the beginning of her period. She was seen in MAU on 3/24 and noted to have a normal IUP with a heartbeat and a yeast infection. She states she used her medication for yeast one time so far.   OB History    Gravida  2   Para  1   Term  1   Preterm      AB      Living  1     SAB      TAB      Ectopic      Multiple  0   Live Births  1           Past Medical History:  Diagnosis Date  . BV (bacterial vaginosis)   . Migraine   . PID (acute pelvic inflammatory disease)     Past Surgical History:  Procedure Laterality Date  . NO PAST SURGERIES      Family History  Problem Relation Age of Onset  . Cancer Maternal Aunt        lung cancer  . Cancer Cousin        lung  . Hypertension Mother     Social History   Tobacco Use  . Smoking status: Former Smoker    Years: 3.00    Last attempt to quit: 12/08/2012    Years since quitting: 5.0  . Smokeless tobacco: Former NeurosurgeonUser    Quit date: 10/02/2014  Substance Use Topics  . Alcohol use: No    Comment: in past  . Drug use: No    Types: Marijuana    Comment: Quit on 10/02/2014 when finding out she was preg.    Allergies: No Known Allergies  Medications Prior to Admission  Medication Sig Dispense Refill Last Dose  . miconazole (MONISTAT 7) 2 % vaginal cream Place 1 Applicatorful vaginally at bedtime. 45 g 0 12/23/2017 at Unknown time  . Prenatal Vit-Fe Fumarate-FA (PRENATAL COMPLETE) 14-0.4 MG TABS Take 1 tablet by mouth daily. 60 each 0 Has not started     Review of Systems  Constitutional: Negative.  Negative for fatigue and fever.  HENT: Negative.   Respiratory: Negative.  Negative for shortness of breath.   Cardiovascular: Negative.  Negative for chest pain.  Gastrointestinal: Positive for abdominal pain. Negative for constipation, diarrhea, nausea and vomiting.  Genitourinary: Positive for vaginal bleeding. Negative for dysuria.  Neurological: Negative.  Negative for dizziness and headaches.   Physical Exam   Blood pressure 97/60, pulse 73, temperature 98.6 F (37 C), temperature source Oral, resp. rate 18, height 5\' 3"  (1.6 m), weight 117 lb (53.1 kg), SpO2 100 %.  Physical Exam  Nursing note and vitals reviewed. Constitutional: She is oriented to person, place, and time. She appears well-developed and well-nourished. No distress.  HENT:  Head: Normocephalic.  Eyes: Pupils are equal, round, and reactive to light.  Cardiovascular: Normal rate, regular rhythm and normal heart sounds.  Respiratory: Effort normal and breath sounds normal. No respiratory distress.  GI: Soft. Bowel sounds are normal. She exhibits no distension. There is no tenderness.  Genitourinary: Cervix exhibits no motion tenderness and no friability. Vaginal discharge found.  Genitourinary Comments: Copious thick white adherent discharge. Erythema to vaginal walls and scant amount of pink blood noted on vaginal walls. Cervix closed, thick.   Neurological: She is alert and oriented to person, place, and time.  Skin: Skin is warm and dry.  Psychiatric: She has a normal mood and affect. Her behavior is normal. Judgment and thought content normal.    MAU Course  Procedures Results for orders placed or performed during the hospital encounter of 12/23/17 (from the past 24 hour(s))  Urinalysis, Routine w reflex microscopic     Status: Abnormal   Collection Time: 12/24/17 12:05 AM  Result Value Ref Range   Color, Urine STRAW (A) YELLOW   APPearance CLEAR CLEAR    Specific Gravity, Urine 1.004 (L) 1.005 - 1.030   pH 7.0 5.0 - 8.0   Glucose, UA NEGATIVE NEGATIVE mg/dL   Hgb urine dipstick NEGATIVE NEGATIVE   Bilirubin Urine NEGATIVE NEGATIVE   Ketones, ur NEGATIVE NEGATIVE mg/dL   Protein, ur NEGATIVE NEGATIVE mg/dL   Nitrite NEGATIVE NEGATIVE   Leukocytes, UA TRACE (A) NEGATIVE   RBC / HPF 0-5 0 - 5 RBC/hpf   WBC, UA 0-5 0 - 5 WBC/hpf   Bacteria, UA NONE SEEN NONE SEEN   Squamous Epithelial / LPF 0-5 (A) NONE SEEN   Mucus PRESENT    MDM UA B Pos blood type  Assessment and Plan   1. Threatened miscarriage   2. Candidiasis of vagina during pregnancy   3. Vaginal bleeding during pregnancy    -Discharge home in stable condition -Encouraged patient to continue use of prescription for yeast infection -Miscarriage bleeding and pain precautions discussed -Patient advised to follow-up with [facility] to establish prenatal care -Patient may return to MAU as needed or if her condition were to change or worsen  Rolm Bookbinder CNM 12/24/2017, 1:09 AM

## 2017-12-24 NOTE — MAU Note (Signed)
Pt here with c/o cramping and spotting. Was seen a couple of days ago and had lab work and US.

## 2017-12-25 ENCOUNTER — Other Ambulatory Visit (HOSPITAL_COMMUNITY): Payer: Self-pay | Admitting: Obstetrics and Gynecology

## 2017-12-30 ENCOUNTER — Inpatient Hospital Stay (HOSPITAL_COMMUNITY)
Admission: AD | Admit: 2017-12-30 | Discharge: 2017-12-30 | Disposition: A | Discharge status: Home / Self Care | Payer: Medicaid Other | Source: Ambulatory Visit | Attending: Obstetrics and Gynecology | Admitting: Obstetrics and Gynecology

## 2017-12-30 ENCOUNTER — Encounter (HOSPITAL_COMMUNITY): Payer: Self-pay | Admitting: *Deleted

## 2017-12-30 ENCOUNTER — Ambulatory Visit (HOSPITAL_COMMUNITY)
Admission: RE | Admit: 2017-12-30 | Discharge: 2017-12-30 | Disposition: A | Discharge status: Home / Self Care | Payer: Medicaid Other | Source: Ambulatory Visit | Attending: Obstetrics and Gynecology | Admitting: Obstetrics and Gynecology

## 2017-12-30 ENCOUNTER — Telehealth (HOSPITAL_COMMUNITY): Payer: Self-pay

## 2017-12-30 ENCOUNTER — Other Ambulatory Visit: Payer: Self-pay

## 2017-12-30 DIAGNOSIS — Z3A01 Less than 8 weeks gestation of pregnancy: Secondary | ICD-10-CM | POA: Insufficient documentation

## 2017-12-30 DIAGNOSIS — N83201 Unspecified ovarian cyst, right side: Secondary | ICD-10-CM | POA: Diagnosis not present

## 2017-12-30 DIAGNOSIS — O208 Other hemorrhage in early pregnancy: Secondary | ICD-10-CM | POA: Insufficient documentation

## 2017-12-30 DIAGNOSIS — O468X1 Other antepartum hemorrhage, first trimester: Secondary | ICD-10-CM | POA: Diagnosis not present

## 2017-12-30 DIAGNOSIS — Z87891 Personal history of nicotine dependence: Secondary | ICD-10-CM | POA: Diagnosis not present

## 2017-12-30 DIAGNOSIS — O3481 Maternal care for other abnormalities of pelvic organs, first trimester: Secondary | ICD-10-CM | POA: Diagnosis not present

## 2017-12-30 DIAGNOSIS — O209 Hemorrhage in early pregnancy, unspecified: Secondary | ICD-10-CM | POA: Diagnosis present

## 2017-12-30 DIAGNOSIS — O418X10 Other specified disorders of amniotic fluid and membranes, first trimester, not applicable or unspecified: Secondary | ICD-10-CM

## 2017-12-30 DIAGNOSIS — Z6790 Unspecified blood type, Rh positive: Secondary | ICD-10-CM

## 2017-12-30 LAB — URINALYSIS, ROUTINE W REFLEX MICROSCOPIC
BILIRUBIN URINE: NEGATIVE
Glucose, UA: NEGATIVE mg/dL
Hgb urine dipstick: NEGATIVE
KETONES UR: NEGATIVE mg/dL
LEUKOCYTES UA: NEGATIVE
NITRITE: NEGATIVE
PH: 7 (ref 5.0–8.0)
Protein, ur: NEGATIVE mg/dL
Specific Gravity, Urine: 1.006 (ref 1.005–1.030)

## 2017-12-30 NOTE — Telephone Encounter (Signed)
Pt returned call to go over results from recent visit. Denies continued symptoms. Verbalized understanding.

## 2017-12-30 NOTE — MAU Note (Signed)
Discharge instructions and OTC medications sheet given and reviewed with pt . Pt to keep first appoint [facility] already scheduled.

## 2017-12-30 NOTE — MAU Note (Signed)
Pt presents with VB and abdominal cramping.  Reports has been seen several times for VB and has had U/S but was told "they can't tell her nothing".   States VB is off & on.  Reports VB as heavy.

## 2017-12-30 NOTE — Telephone Encounter (Signed)
Attempted to reach patient regarding test results from recent visit and assess need for new prescription. No answer at this time. Message left encouraging patient to call us back.

## 2017-12-30 NOTE — MAU Provider Note (Signed)
History     CSN: [id number]  Arrival date and time: 12/30/17 1647   First Provider Initiated Contact with Patient 12/30/17 1811      Chief Complaint  Patient presents with  . Vaginal Bleeding  . Abdominal Pain   G2P1001 @[redacted]w[redacted]d  here with VB and cramping. Sx started about 10 days ago. Bleeding is intermittent and not every day. She wears a panty liner but it's not always stained. Today when she arrived she had a large amt of bright red blood in the toilet. The cramping is bilateral in lower abdominal.   OB History    Gravida  2   Para  1   Term  1   Preterm      AB      Living  1     SAB      TAB      Ectopic      Multiple  0   Live Births  1           Past Medical History:  Diagnosis Date  . BV (bacterial vaginosis)   . Migraine   . PID (acute pelvic inflammatory disease)     Past Surgical History:  Procedure Laterality Date  . NO PAST SURGERIES      Family History  Problem Relation Age of Onset  . Cancer Maternal Aunt        lung cancer  . Cancer Cousin        lung  . Hypertension Mother     Social History   Tobacco Use  . Smoking status: Former Smoker    Years: 3.00    Last attempt to quit: 12/08/2012    Years since quitting: 5.0  . Smokeless tobacco: Former NeurosurgeonUser    Quit date: 10/02/2014  Substance Use Topics  . Alcohol use: No    Comment: in past  . Drug use: No    Types: Marijuana    Comment: Quit on 10/02/2014 when finding out she was preg.    Allergies: No Known Allergies  Medications Prior to Admission  Medication Sig Dispense Refill Last Dose  . miconazole (MONISTAT 7) 2 % vaginal cream Place 1 Applicatorful vaginally at bedtime. 45 g 0 12/23/2017 at Unknown time  . Prenatal Vit-Fe Fumarate-FA (PRENATAL COMPLETE) 14-0.4 MG TABS Take 1 tablet by mouth daily. 60 each 0 Has not started    Review of Systems  Gastrointestinal: Positive for abdominal pain and nausea. Negative for vomiting.  Genitourinary: Positive for vaginal  bleeding.   Physical Exam   Blood pressure 111/66, temperature 98.3 F (36.8 C), temperature source Oral, resp. rate 18, height 5\' 3"  (1.6 m), weight 115 lb 4 oz (52.3 kg), SpO2 100 %.  Physical Exam  Nursing note and vitals reviewed. Constitutional: She is oriented to person, place, and time. She appears well-developed and well-nourished. No distress.  HENT:  Head: Normocephalic and atraumatic.  Neck: Normal range of motion.  Respiratory: Effort normal. No respiratory distress.  Genitourinary:  Genitourinary Comments: External: no lesions or erythema Vagina: rugated, pink, moist, scant brown bloody discharge Cervix closed   Musculoskeletal: Normal range of motion.  Neurological: She is alert and oriented to person, place, and time.  Skin: Skin is warm and dry.  Psychiatric: She has a normal mood and affect.   Results for orders placed or performed during the hospital encounter of 12/30/17 (from the past 24 hour(s))  Urinalysis, Routine w reflex microscopic     Status: Abnormal   Collection  Time: 12/30/17  5:20 PM  Result Value Ref Range   Color, Urine STRAW (A) YELLOW   APPearance CLEAR CLEAR   Specific Gravity, Urine 1.006 1.005 - 1.030   pH 7.0 5.0 - 8.0   Glucose, UA NEGATIVE NEGATIVE mg/dL   Hgb urine dipstick NEGATIVE NEGATIVE   Bilirubin Urine NEGATIVE NEGATIVE   Ketones, ur NEGATIVE NEGATIVE mg/dL   Protein, ur NEGATIVE NEGATIVE mg/dL   Nitrite NEGATIVE NEGATIVE   Leukocytes, UA NEGATIVE NEGATIVE   US Ob Transvaginal  Result Date: 12/30/2017 CLINICAL DATA:  Pregnant patient with vaginal bleeding. EXAM: TRANSVAGINAL OB ULTRASOUND TECHNIQUE: Transvaginal ultrasound was performed for complete evaluation of the gestation as well as the maternal uterus, adnexal regions, and pelvic cul-de-sac. COMPARISON:  Pelvic ultrasound 12/21/2017. FINDINGS: Intrauterine gestational sac: Single Yolk sac:  Visualized. Embryo:  Visualized. Cardiac Activity: Visualized. Heart Rate: 158 bpm  CRL:   14.9 mm   7 w 5 d                  [place] EDC: 08/13/2018 Subchorionic hemorrhage:  Small Maternal uterus/adnexae: 5.3 x 4.0 x 4.9 cm cyst right ovary. Left ovary is normal. No free fluid in the pelvis. IMPRESSION: Single live intrauterine gestation. Small subchorionic hemorrhage. There is a large right ovarian cyst. Electronically Signed   By: Annia Belt M.D.   On: 12/30/2017 17:25   MAU Course  Procedures  MDM Labs and [place] ordered and reviewed. Viable IUP with small SCH and Rt ovarian cyst. Discussed findings with pt and bleeding/return precautions. Presentation, clinical findings, and plan discussed with Dr. Normand Sloop. Stable for discharge home.  Assessment and Plan   1. [redacted] weeks gestation of pregnancy   2. Subchorionic hemorrhage of placenta in first trimester, single or unspecified fetus   3. Blood type, Rh positive   4. Right ovarian cyst    Discharge home Follow up at [facility] as scheduled Bleeding/return precautions OTC nausea med list provided  Allergies as of 12/30/2017   No Known Allergies     Medication List    TAKE these medications   miconazole 2 % vaginal cream Commonly known as:  MONISTAT 7 Place 1 Applicatorful vaginally at bedtime.   PRENATAL COMPLETE 14-0.4 MG Tabs Take 1 tablet by mouth daily.      Donette Larry, CNM 12/30/2017, 6:39 PM

## 2017-12-30 NOTE — Discharge Instructions (Signed)
Morning Sickness Morning sickness is when you feel sick to your stomach (nauseous) during pregnancy. You may feel sick to your stomach and throw up (vomit). You may feel sick in the morning, but you can feel this way any time of day. Some women feel very sick to their stomach and cannot stop throwing up (hyperemesis gravidarum). Follow these instructions at home:  Only take medicines as told by your doctor.  Take multivitamins as told by your doctor. Taking multivitamins before getting pregnant can stop or lessen the harshness of morning sickness.  Eat dry toast or unsalted crackers before getting out of bed.  Eat 5 to 6 small meals a day.  Eat dry and bland foods like rice and baked potatoes.  Do not drink liquids with meals. Drink between meals.  Do not eat greasy, fatty, or spicy foods.  Have someone cook for you if the smell of food causes you to feel sick or throw up.  If you feel sick to your stomach after taking prenatal vitamins, take them at night or with a snack.  Eat protein when you need a snack (nuts, yogurt, cheese).  Eat unsweetened gelatins for dessert.  Wear a bracelet used for sea sickness (acupressure wristband).  Go to a doctor that puts thin needles into certain body points (acupuncture) to improve how you feel.  Do not smoke.  Use a humidifier to keep the air in your house free of odors.  Get lots of fresh air. Contact a doctor if:  You need medicine to feel better.  You feel dizzy or lightheaded.  You are losing weight. Get help right away if:  You feel very sick to your stomach and cannot stop throwing up.  You pass out (faint). This information is not intended to replace advice given to you by your health care provider. Make sure you discuss any questions you have with your health care provider. Document Released: 10/24/2004 Document Revised: 02/22/2016 Document Reviewed: 03/03/2013 Elsevier Interactive Patient Education  2017 Elsevier  Inc. Subchorionic Hematoma A subchorionic hematoma is a gathering of blood between the outer wall of the placenta and the inner wall of the womb (uterus). The placenta is the organ that connects the fetus to the wall of the uterus. The placenta performs the feeding, breathing (oxygen to the fetus), and waste removal (excretory work) of the fetus. Subchorionic hematoma is the most common abnormality found on a result from ultrasonography done during the first trimester or early second trimester of pregnancy. If there has been little or no vaginal bleeding, early small hematomas usually shrink on their own and do not affect your baby or pregnancy. The blood is gradually absorbed over 1-2 weeks. When bleeding starts later in pregnancy or the hematoma is larger or occurs in an older pregnant woman, the outcome may not be as good. Larger hematomas may get bigger, which increases the chances for miscarriage. Subchorionic hematoma also increases the risk of premature detachment of the placenta from the uterus, preterm (premature) labor, and stillbirth. Follow these instructions at home:  Stay on bed rest if your health care provider recommends this. Although bed rest will not prevent more bleeding or prevent a miscarriage, your health care provider may recommend bed rest until you are advised otherwise.  Avoid heavy lifting (more than 10 lb [4.5 kg]), exercise, sexual intercourse, or douching as directed by your health care provider.  Keep track of the number of pads you use each day and how soaked (saturated) they are. Write  down this information.  Do not use tampons.  Keep all follow-up appointments as directed by your health care provider. Your health care provider may ask you to have follow-up blood tests or ultrasound tests or both. Get help right away if:  You have severe cramps in your stomach, back, abdomen, or pelvis.  You have a fever.  You pass large clots or tissue. Save any tissue for your  health care provider to look at.  Your bleeding increases or you become lightheaded, feel weak, or have fainting episodes. This information is not intended to replace advice given to you by your health care provider. Make sure you discuss any questions you have with your health care provider. Document Released: 01/01/2007 Document Revised: 02/22/2016 Document Reviewed: 04/15/2013 Elsevier Interactive Patient Education  2017 [employer]

## 2018-01-01 DIAGNOSIS — O418X90 Other specified disorders of amniotic fluid and membranes, unspecified trimester, not applicable or unspecified: Secondary | ICD-10-CM | POA: Insufficient documentation

## 2018-01-01 DIAGNOSIS — O468X9 Other antepartum hemorrhage, unspecified trimester: Secondary | ICD-10-CM

## 2018-01-12 DIAGNOSIS — Z3490 Encounter for supervision of normal pregnancy, unspecified, unspecified trimester: Secondary | ICD-10-CM | POA: Insufficient documentation

## 2018-02-03 DIAGNOSIS — R87810 Cervical high risk human papillomavirus (HPV) DNA test positive: Secondary | ICD-10-CM | POA: Insufficient documentation

## 2018-02-14 ENCOUNTER — Inpatient Hospital Stay (HOSPITAL_COMMUNITY)
Admission: AD | Admit: 2018-02-14 | Discharge: 2018-02-14 | Discharge status: Against Medical Advice | Payer: Medicaid Other | Source: Ambulatory Visit | Attending: Obstetrics & Gynecology | Admitting: Obstetrics & Gynecology

## 2018-02-14 ENCOUNTER — Encounter (HOSPITAL_COMMUNITY): Payer: Self-pay | Admitting: *Deleted

## 2018-02-14 DIAGNOSIS — Z3A14 14 weeks gestation of pregnancy: Secondary | ICD-10-CM | POA: Insufficient documentation

## 2018-02-14 DIAGNOSIS — M549 Dorsalgia, unspecified: Secondary | ICD-10-CM | POA: Insufficient documentation

## 2018-02-14 DIAGNOSIS — O26892 Other specified pregnancy related conditions, second trimester: Secondary | ICD-10-CM | POA: Insufficient documentation

## 2018-02-14 DIAGNOSIS — R109 Unspecified abdominal pain: Secondary | ICD-10-CM | POA: Insufficient documentation

## 2018-02-14 LAB — URINALYSIS, ROUTINE W REFLEX MICROSCOPIC
Bilirubin Urine: NEGATIVE
GLUCOSE, UA: NEGATIVE mg/dL
Hgb urine dipstick: NEGATIVE
Ketones, ur: NEGATIVE mg/dL
LEUKOCYTES UA: NEGATIVE
Nitrite: NEGATIVE
PROTEIN: NEGATIVE mg/dL
Specific Gravity, Urine: <1.005 — ABNORMAL LOW (ref 1.005–1.030)
pH: 5.5 (ref 5.0–8.0)

## 2018-02-14 NOTE — MAU Note (Signed)
After Triage pt stated she wanted to go out to her car to get something. Pt has not returned yet to finish her evaluation.

## 2018-02-14 NOTE — MAU Note (Signed)
Pt reports having back pain off and on for several days and and sharp pain in her pelvic area every once in a while.

## 2018-02-14 NOTE — MAU Note (Signed)
Pt still not back from going to her car. Assume she has eloped and will discharge her.

## 2018-02-27 ENCOUNTER — Inpatient Hospital Stay (HOSPITAL_COMMUNITY)
Admission: AD | Admit: 2018-02-27 | Discharge: 2018-02-27 | Discharge status: Against Medical Advice | Payer: Medicaid Other | Source: Ambulatory Visit | Attending: Obstetrics and Gynecology | Admitting: Obstetrics and Gynecology

## 2018-02-27 ENCOUNTER — Encounter (HOSPITAL_COMMUNITY): Payer: Self-pay

## 2018-02-27 ENCOUNTER — Other Ambulatory Visit: Payer: Self-pay

## 2018-02-27 DIAGNOSIS — R42 Dizziness and giddiness: Secondary | ICD-10-CM | POA: Diagnosis present

## 2018-02-27 DIAGNOSIS — O26892 Other specified pregnancy related conditions, second trimester: Secondary | ICD-10-CM | POA: Diagnosis not present

## 2018-02-27 DIAGNOSIS — Z3A16 16 weeks gestation of pregnancy: Secondary | ICD-10-CM | POA: Diagnosis not present

## 2018-02-27 DIAGNOSIS — Z5321 Procedure and treatment not carried out due to patient leaving prior to being seen by health care provider: Secondary | ICD-10-CM | POA: Insufficient documentation

## 2018-02-27 LAB — URINALYSIS, ROUTINE W REFLEX MICROSCOPIC
Bilirubin Urine: NEGATIVE
Glucose, UA: NEGATIVE mg/dL
HGB URINE DIPSTICK: NEGATIVE
KETONES UR: NEGATIVE mg/dL
NITRITE: NEGATIVE
Protein, ur: NEGATIVE mg/dL
Specific Gravity, Urine: 1.009 (ref 1.005–1.030)
pH: 8 (ref 5.0–8.0)

## 2018-02-27 NOTE — MAU Note (Signed)
Keep getting dizzy, blurry eyed, feels weak like she is going to faint.

## 2018-02-27 NOTE — Progress Notes (Addendum)
G2P1 @ [redacted] wksga. Here dt dizziness since yesterday but went to sleep. Today went to work at 0800 and dizziness started again. Denies LOF or bleeding. "sometimes" feels flutter movements.   Doppler: 146  1055: pt states needs to get food from car.   1125: Pt did not return from getting food from car

## 2018-05-22 IMAGING — US US OB COMP LESS 14 WK
1 series · 15 of 28 positions shown · non-contrast
Comparison: None.

CLINICAL DATA: Bleeding since December 20, 2017

EXAM:
OBSTETRIC <14 WK US AND TRANSVAGINAL OB US
TECHNIQUE: Both transabdominal and transvaginal ultrasound examinations were
performed for complete evaluation of the gestation as well as the
maternal uterus, adnexal regions, and pelvic cul-de-sac.
Transvaginal technique was performed to assess early pregnancy.

[Series 1: us ob comp less 14 wk · 15 of 75 slices shown]
[im 1/75]
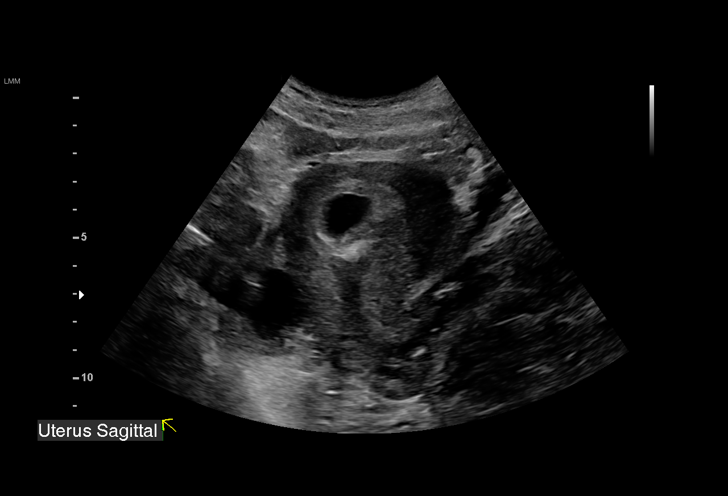
[im 6/75]
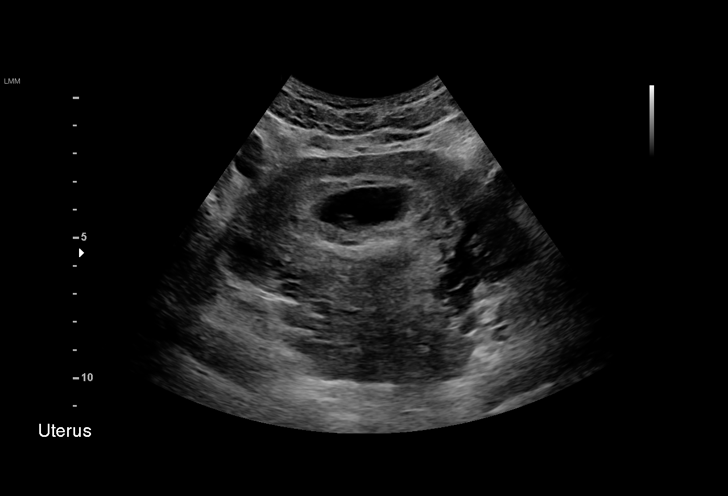
[im 11/75]
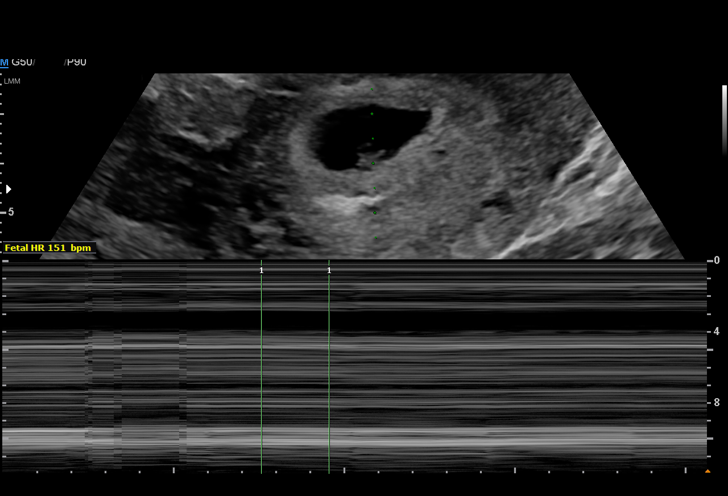
[im 17/75]
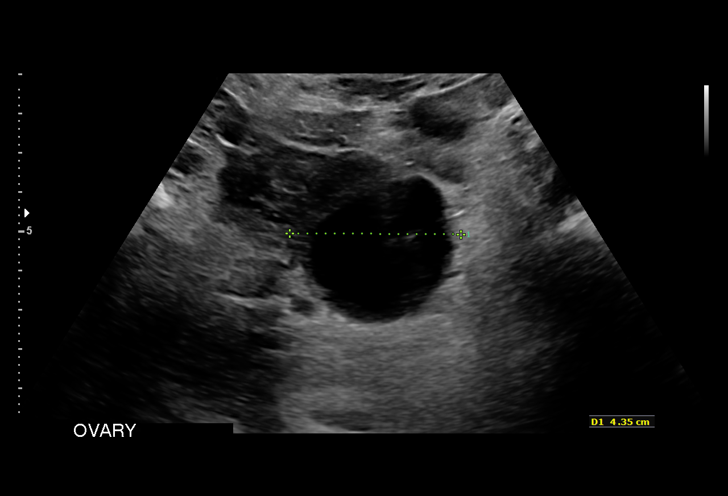
[im 22/75]
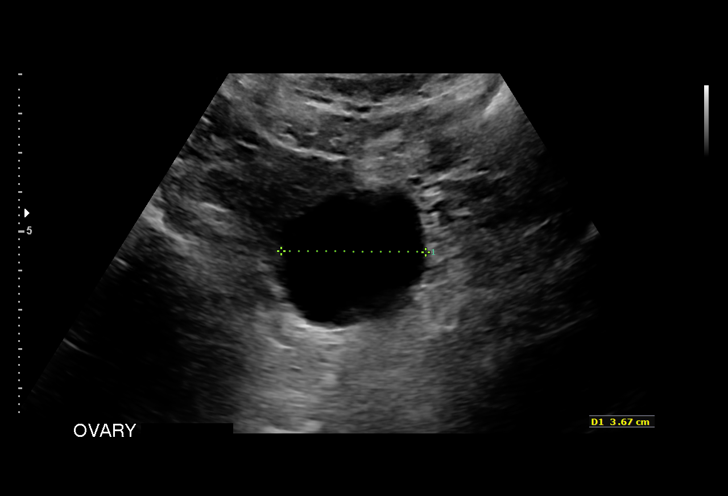
[im 28/75]
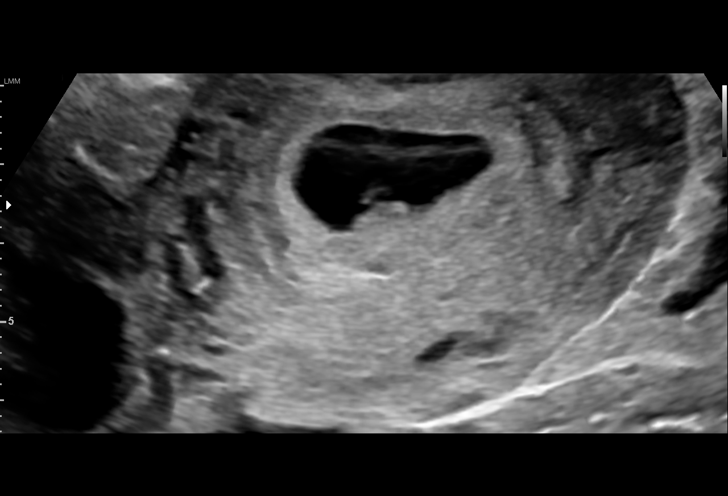
[im 33/75]
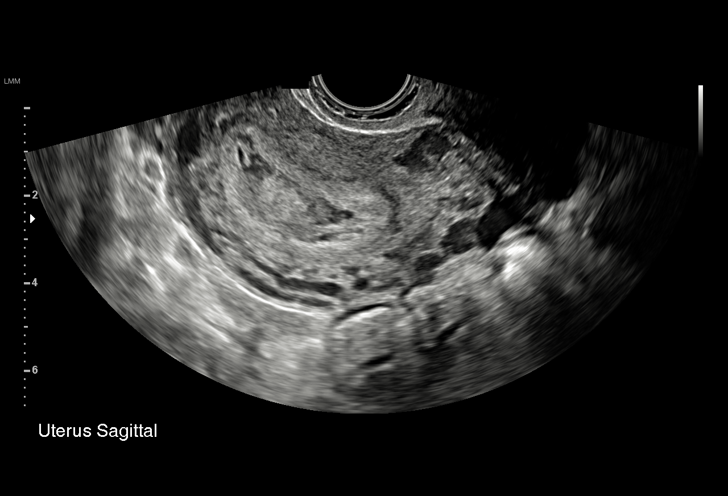
[im 39/75]
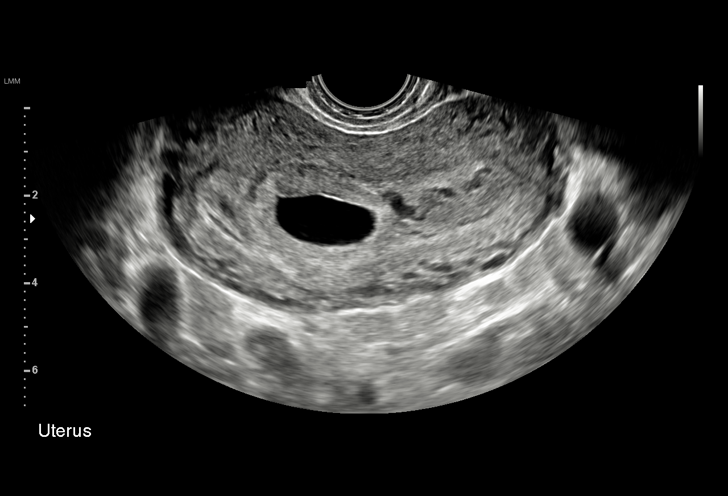
[im 42/75]
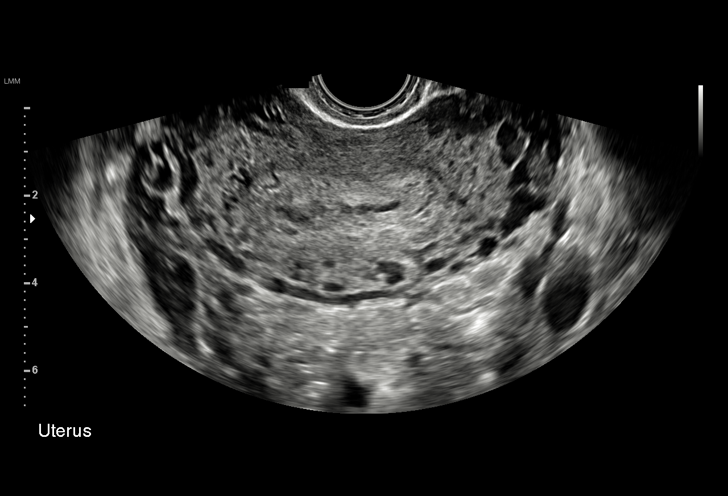
[im 47/75]
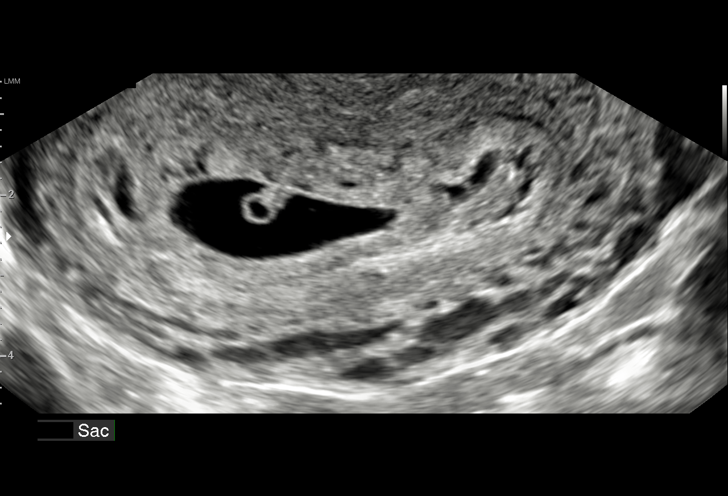
[im 53/75]
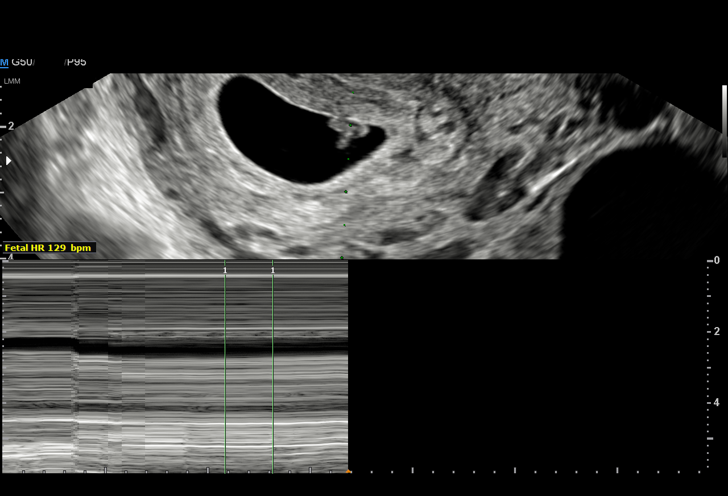
[im 58/75]
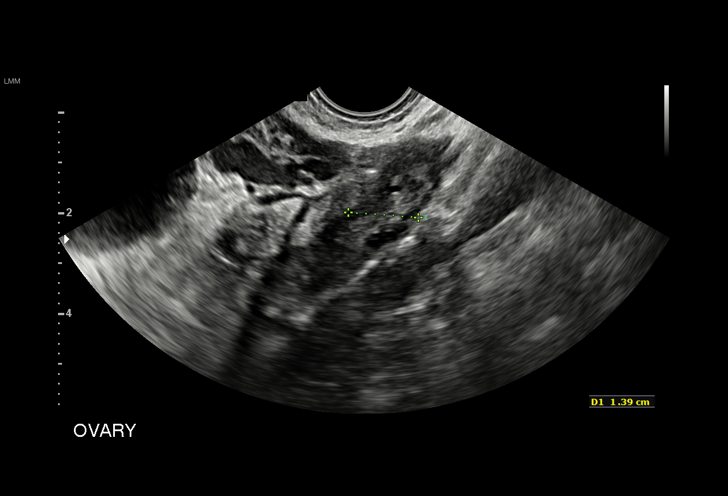
[im 64/75]
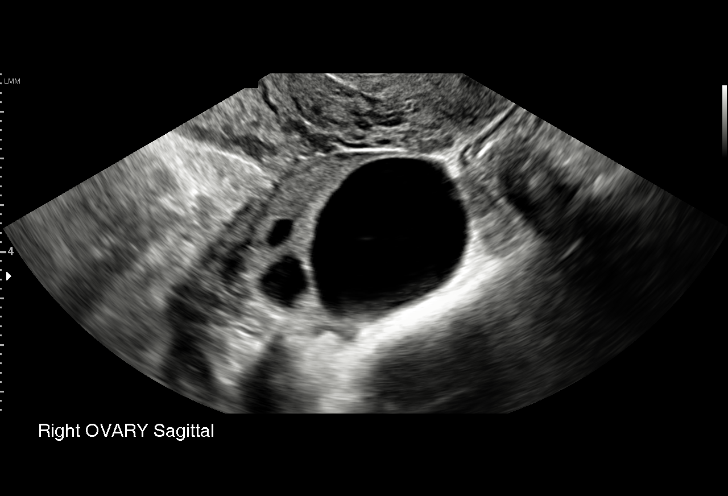
[im 69/75]
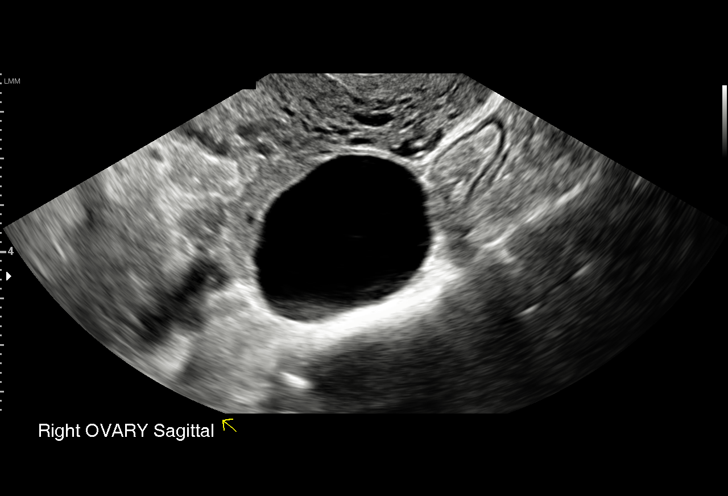
[im 75/75]
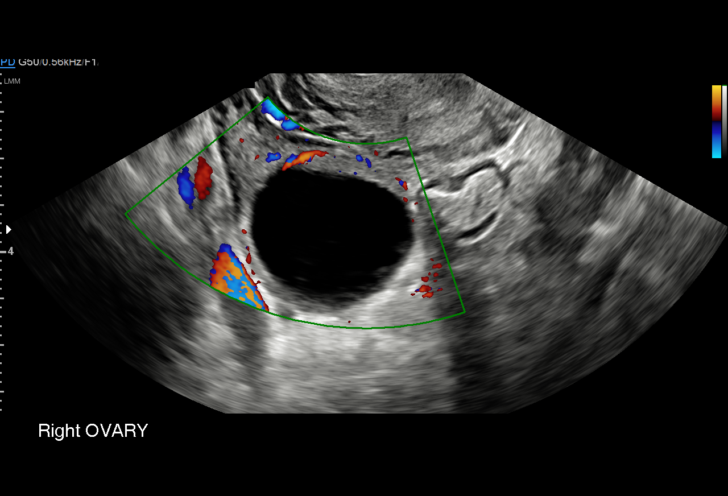

[15 of 28 positions shown; findings below may reference images not displayed]

FINDINGS: Intrauterine gestational sac: Single

Yolk sac:  Visualized.

Embryo:  Visualized.

Cardiac Activity: Visualized.

Heart Rate: 129 bpm

MSD:   mm    w     d

CRL:  6.5 mm   6 w   3 d                  US EDC: August 13, 2018

Subchorionic hemorrhage:  None

Maternal uterus/adnexae: The left ovary is normal. The right ovary
contains a 4.2 x 3.2 x 3.8 cm simple cyst/follicle.
IMPRESSION: Single live IUP.  Dominant 4.2 cm follicle in the right ovary.

## 2018-05-31 IMAGING — US US OB TRANSVAGINAL
1 series · 15 of 28 positions shown · non-contrast
Comparison: Pelvic ultrasound 12/21/2017.

CLINICAL DATA: Pregnant patient with vaginal bleeding.

EXAM:
TRANSVAGINAL OB ULTRASOUND
TECHNIQUE: Transvaginal ultrasound was performed for complete evaluation of the
gestation as well as the maternal uterus, adnexal regions, and
pelvic cul-de-sac.

[Series 1: us ob transvaginal · 31 acquisitions, 15 frames shown]
[im 1/31]
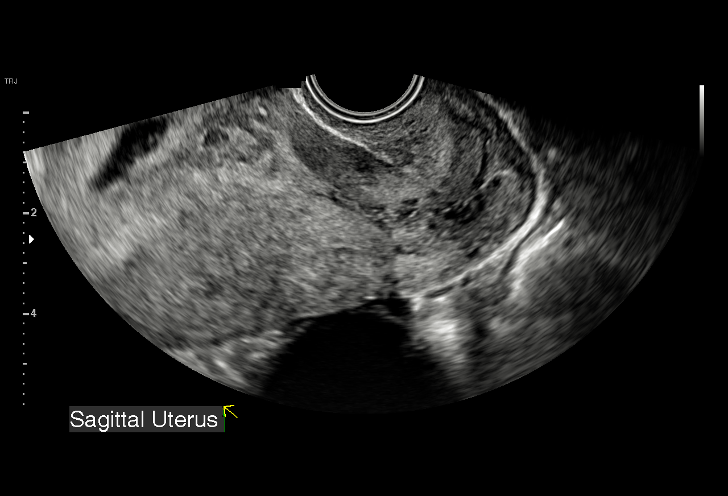
[im 3/31]
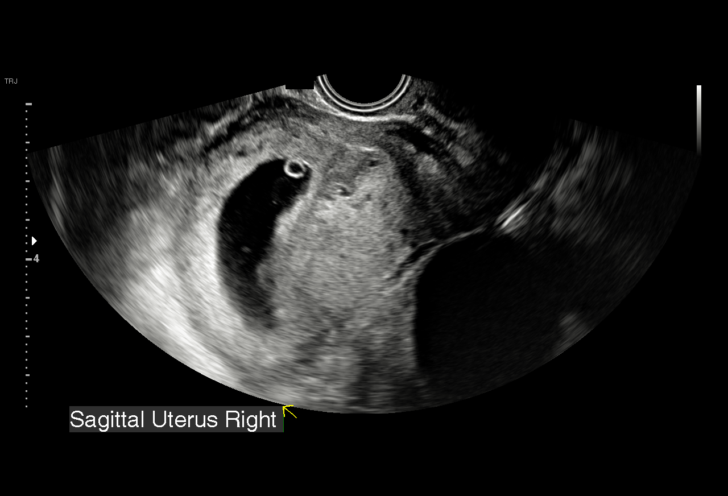
[im 5/31]
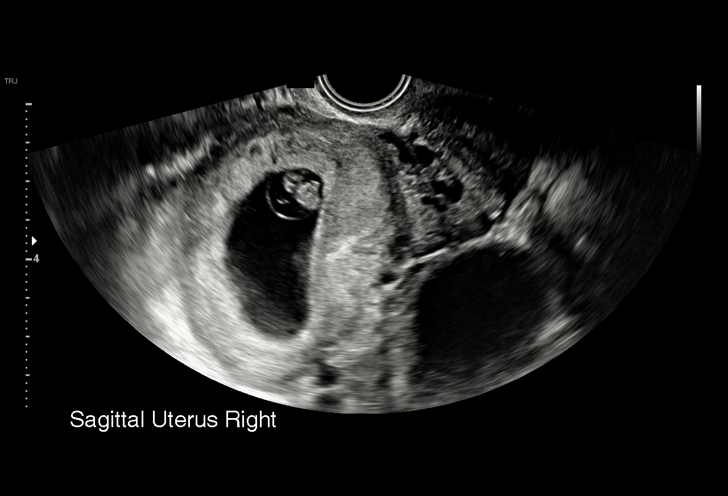
[im 7/31]
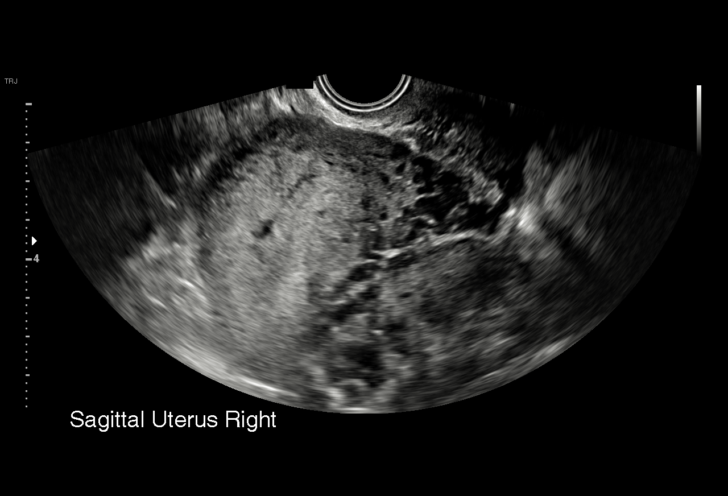
[im 9/31]
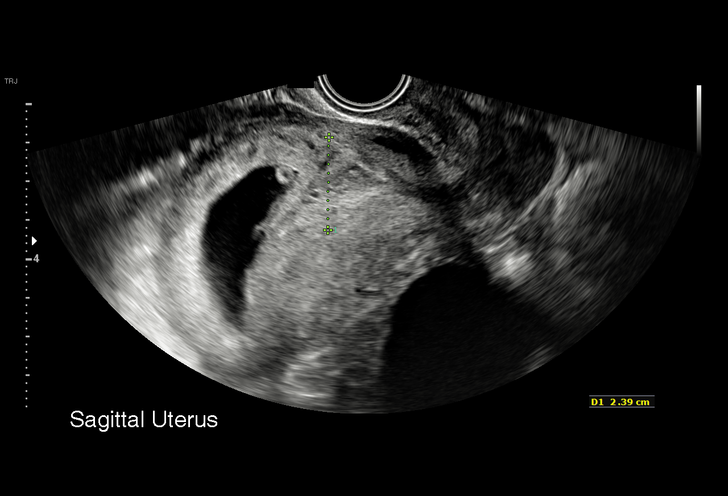
[im 12/31]
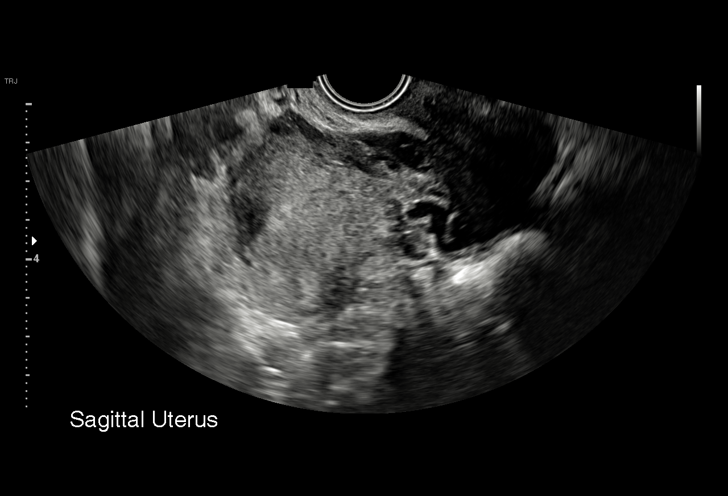
[im 14/31]
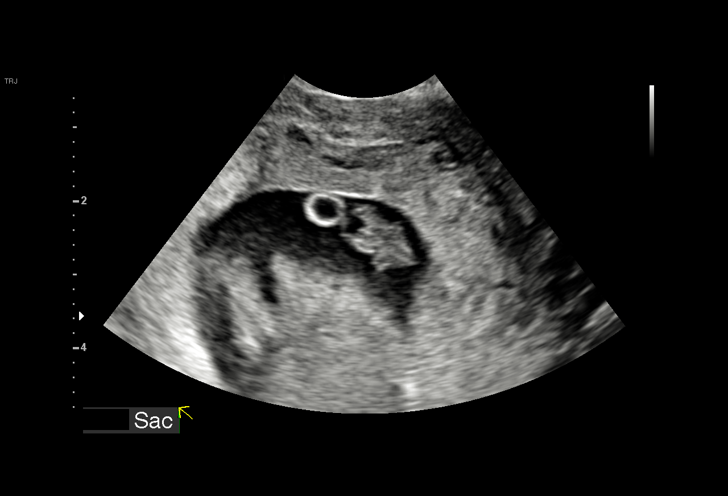
[im 16/31]
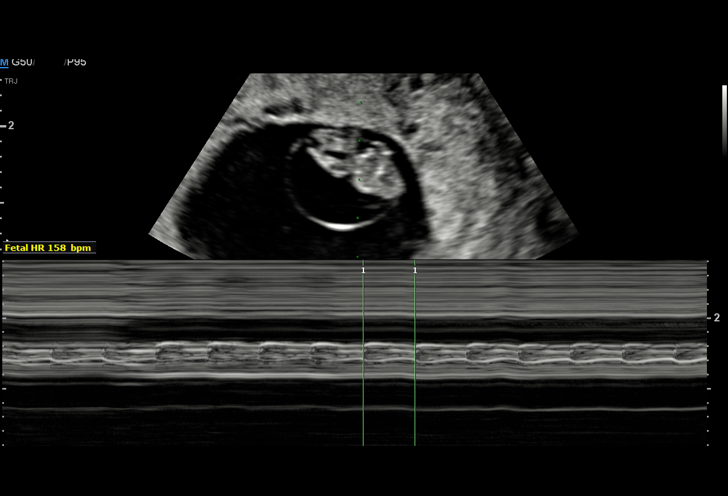
[im 17/31]
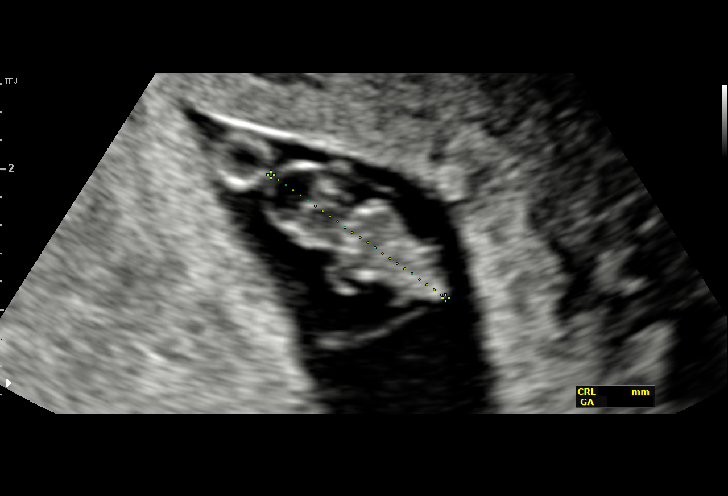
[im 19/31]
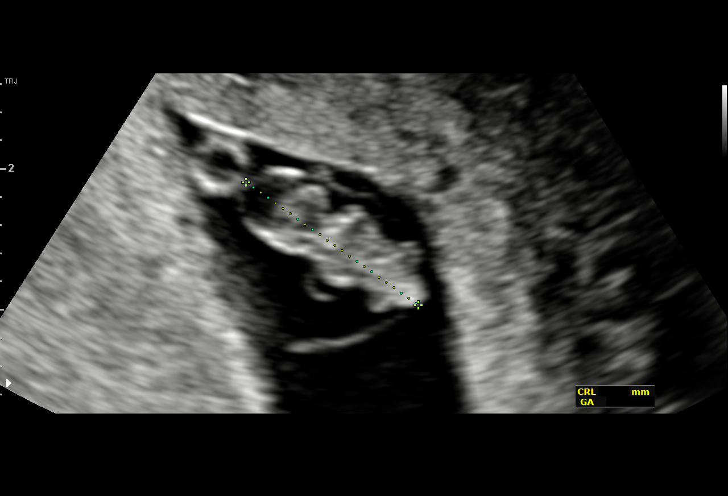
[im 22/31]
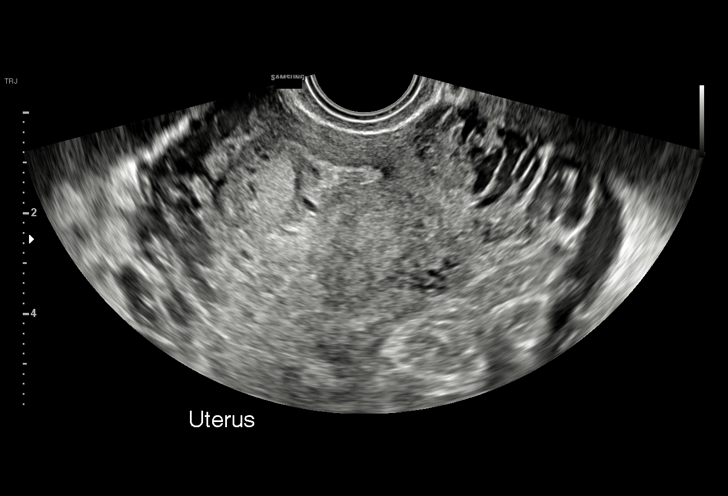
[im 24/31]
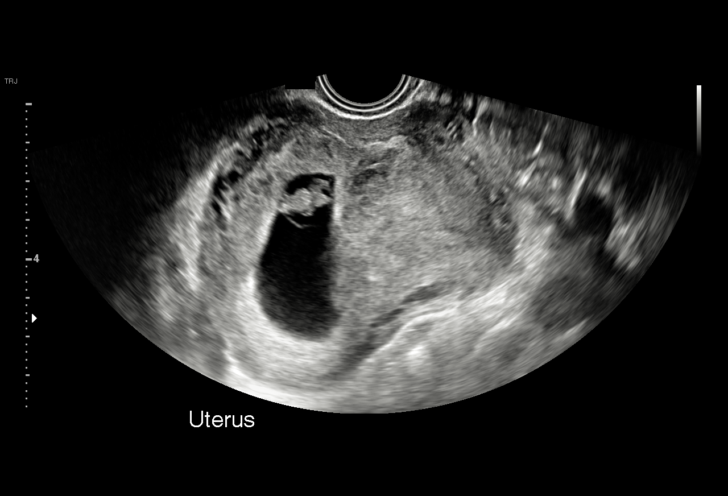
[im 26/31]
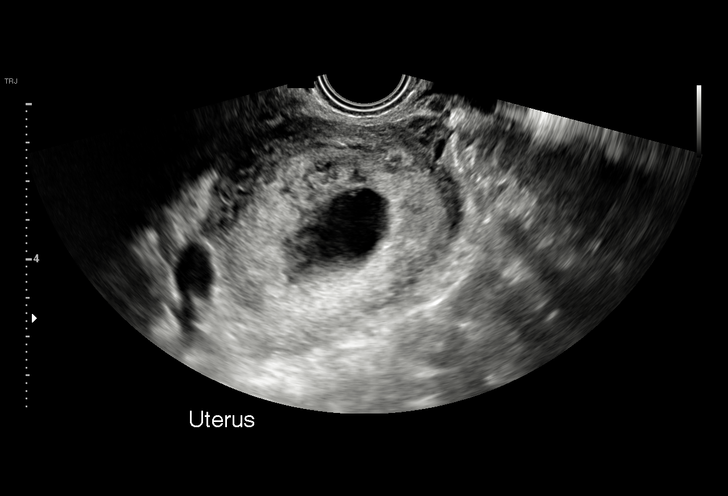
[im 28/31]
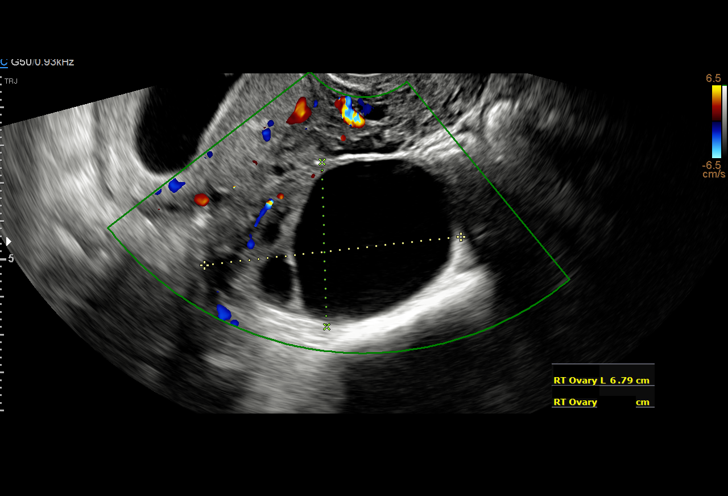
[im 31/31]
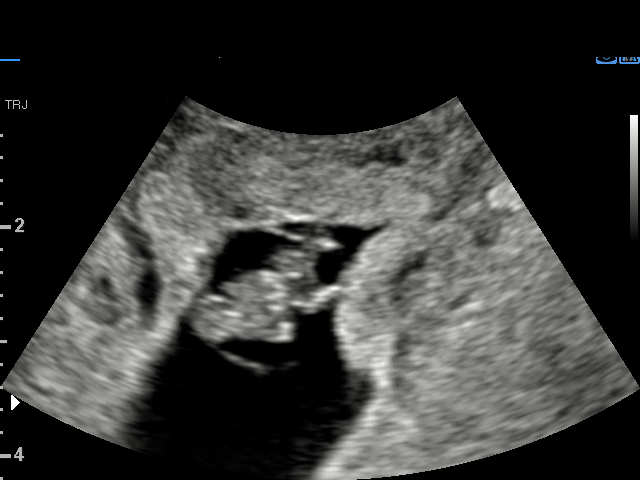

[15 of 28 positions shown; findings below may reference images not displayed]

FINDINGS: Intrauterine gestational sac: Single

Yolk sac:  Visualized.

Embryo:  Visualized.

Cardiac Activity: Visualized.

Heart Rate: 158 bpm

CRL:   14.9 mm   7 w 5 d                  US EDC: 08/13/2018

Subchorionic hemorrhage:  Small

Maternal uterus/adnexae: 5.3 x 4.0 x 4.9 cm cyst right ovary. Left
ovary is normal. No free fluid in the pelvis.
IMPRESSION: Single live intrauterine gestation.

Small subchorionic hemorrhage.

There is a large right ovarian cyst.

## 2018-06-01 DIAGNOSIS — D649 Anemia, unspecified: Secondary | ICD-10-CM | POA: Diagnosis present

## 2018-06-24 ENCOUNTER — Inpatient Hospital Stay (HOSPITAL_BASED_OUTPATIENT_CLINIC_OR_DEPARTMENT_OTHER): Payer: Medicaid Other

## 2018-06-24 ENCOUNTER — Inpatient Hospital Stay (HOSPITAL_COMMUNITY)
Admission: AD | Admit: 2018-06-24 | Discharge: 2018-06-24 | Disposition: A | Discharge status: Home / Self Care | Payer: Medicaid Other | Source: Ambulatory Visit | Attending: Obstetrics and Gynecology | Admitting: Obstetrics and Gynecology

## 2018-06-24 ENCOUNTER — Encounter (HOSPITAL_COMMUNITY): Payer: Self-pay

## 2018-06-24 ENCOUNTER — Other Ambulatory Visit: Payer: Self-pay

## 2018-06-24 DIAGNOSIS — O26893 Other specified pregnancy related conditions, third trimester: Secondary | ICD-10-CM | POA: Diagnosis present

## 2018-06-24 DIAGNOSIS — O289 Unspecified abnormal findings on antenatal screening of mother: Secondary | ICD-10-CM | POA: Diagnosis not present

## 2018-06-24 DIAGNOSIS — O368390 Maternal care for abnormalities of the fetal heart rate or rhythm, unspecified trimester, not applicable or unspecified: Secondary | ICD-10-CM

## 2018-06-24 DIAGNOSIS — Z3A32 32 weeks gestation of pregnancy: Secondary | ICD-10-CM

## 2018-06-24 DIAGNOSIS — Z87891 Personal history of nicotine dependence: Secondary | ICD-10-CM | POA: Insufficient documentation

## 2018-06-24 DIAGNOSIS — O368330 Maternal care for abnormalities of the fetal heart rate or rhythm, third trimester, not applicable or unspecified: Secondary | ICD-10-CM | POA: Insufficient documentation

## 2018-06-24 DIAGNOSIS — Z0371 Encounter for suspected problem with amniotic cavity and membrane ruled out: Secondary | ICD-10-CM

## 2018-06-24 LAB — WET PREP, GENITAL
Clue Cells Wet Prep HPF POC: NONE SEEN
Sperm: NONE SEEN
Trich, Wet Prep: NONE SEEN
Yeast Wet Prep HPF POC: NONE SEEN

## 2018-06-24 LAB — POCT FERN TEST: POCT FERN TEST: NEGATIVE

## 2018-06-24 NOTE — Discharge Instructions (Signed)
Third Trimester of Pregnancy The third trimester is from week 28 through week 40 (months 7 through 9). The third trimester is a time when the unborn baby (fetus) is growing rapidly. At the end of the ninth month, the fetus is about 20 inches in length and weighs 6-10 pounds. Body changes during your third trimester Your body will continue to go through many changes during pregnancy. The changes vary from woman to woman. During the third trimester:  Your weight will continue to increase. You can expect to gain 25-35 pounds (11-16 kg) by the end of the pregnancy.  You may begin to get stretch marks on your hips, abdomen, and breasts.  You may urinate more often because the fetus is moving lower into your pelvis and pressing on your bladder.  You may develop or continue to have heartburn. This is caused by increased hormones that slow down muscles in the digestive tract.  You may develop or continue to have constipation because increased hormones slow digestion and cause the muscles that push waste through your intestines to relax.  You may develop hemorrhoids. These are swollen veins (varicose veins) in the rectum that can itch or be painful.  You may develop swollen, bulging veins (varicose veins) in your legs.  You may have increased body aches in the pelvis, back, or thighs. This is due to weight gain and increased hormones that are relaxing your joints.  You may have changes in your hair. These can include thickening of your hair, rapid growth, and changes in texture. Some women also have hair loss during or after pregnancy, or hair that feels dry or thin. Your hair will most likely return to normal after your baby is born.  Your breasts will continue to grow and they will continue to become tender. A yellow fluid (colostrum) may leak from your breasts. This is the first milk you are producing for your baby.  Your belly button may stick out.  You may notice more swelling in your hands,  face, or ankles.  You may have increased tingling or numbness in your hands, arms, and legs. The skin on your belly may also feel numb.  You may feel short of breath because of your expanding uterus.  You may have more problems sleeping. This can be caused by the size of your belly, increased need to urinate, and an increase in your body's metabolism.  You may notice the fetus "dropping," or moving lower in your abdomen (lightening).  You may have increased vaginal discharge.  You may notice your joints feel loose and you may have pain around your pelvic bone.  What to expect at prenatal visits You will have prenatal exams every 2 weeks until week 36. Then you will have weekly prenatal exams. During a routine prenatal visit:  You will be weighed to make sure you and the baby are growing normally.  Your blood pressure will be taken.  Your abdomen will be measured to track your baby's growth.  The fetal heartbeat will be listened to.  Any test results from the previous visit will be discussed.  You may have a cervical check near your due date to see if your cervix has softened or thinned (effaced).  You will be tested for Group B streptococcus. This happens between 35 and 37 weeks.  Your health care provider may ask you:  What your birth plan is.  How you are feeling.  If you are feeling the baby move.  If you have had   any abnormal symptoms, such as leaking fluid, bleeding, severe headaches, or abdominal cramping.  If you are using any tobacco products, including cigarettes, chewing tobacco, and electronic cigarettes.  If you have any questions.  Other tests or screenings that may be performed during your third trimester include:  Blood tests that check for low iron levels (anemia).  Fetal testing to check the health, activity level, and growth of the fetus. Testing is done if you have certain medical conditions or if there are problems during the  pregnancy.  Nonstress test (NST). This test checks the health of your baby to make sure there are no signs of problems, such as the baby not getting enough oxygen. During this test, a belt is placed around your belly. The baby is made to move, and its heart rate is monitored during movement.  What is false labor? False labor is a condition in which you feel small, irregular tightenings of the muscles in the womb (contractions) that usually go away with rest, changing position, or drinking water. These are called Braxton Hicks contractions. Contractions may last for hours, days, or even weeks before true labor sets in. If contractions come at regular intervals, become more frequent, increase in intensity, or become painful, you should see your health care provider. What are the signs of labor?  Abdominal cramps.  Regular contractions that start at 10 minutes apart and become stronger and more frequent with time.  Contractions that start on the top of the uterus and spread down to the lower abdomen and back.  Increased pelvic pressure and dull back pain.  A watery or bloody mucus discharge that comes from the vagina.  Leaking of amniotic fluid. This is also known as your "water breaking." It could be a slow trickle or a gush. Let your health care provider know if it has a color or strange odor. If you have any of these signs, call your health care provider right away, even if it is before your due date. Follow these instructions at home: Medicines  Follow your health care provider's instructions regarding medicine use. Specific medicines may be either safe or unsafe to take during pregnancy.  Take a prenatal vitamin that contains at least 600 micrograms (mcg) of folic acid.  If you develop constipation, try taking a stool softener if your health care provider approves. Eating and drinking  Eat a balanced diet that includes fresh fruits and vegetables, whole grains, good sources of protein  such as meat, eggs, or tofu, and low-fat dairy. Your health care provider will help you determine the amount of weight gain that is right for you.  Avoid raw meat and uncooked cheese. These carry germs that can cause birth defects in the baby.  If you have low calcium intake from food, talk to your health care provider about whether you should take a daily calcium supplement.  Eat four or five small meals rather than three large meals a day.  Limit foods that are high in fat and processed sugars, such as fried and sweet foods.  To prevent constipation: ? Drink enough fluid to keep your urine clear or pale yellow. ? Eat foods that are high in fiber, such as fresh fruits and vegetables, whole grains, and beans. Activity  Exercise only as directed by your health care provider. Most women can continue their usual exercise routine during pregnancy. Try to exercise for 30 minutes at least 5 days a week. Stop exercising if you experience uterine contractions.  Avoid heavy   lifting.  Do not exercise in extreme heat or humidity, or at high altitudes.  Wear low-heel, comfortable shoes.  Practice good posture.  You may continue to have sex unless your health care provider tells you otherwise. Relieving pain and discomfort  Take frequent breaks and rest with your legs elevated if you have leg cramps or low back pain.  Take warm sitz baths to soothe any pain or discomfort caused by hemorrhoids. Use hemorrhoid cream if your health care provider approves.  Wear a good support bra to prevent discomfort from breast tenderness.  If you develop varicose veins: ? Wear support pantyhose or compression stockings as told by your healthcare provider. ? Elevate your feet for 15 minutes, 3-4 times a day. Prenatal care  Write down your questions. Take them to your prenatal visits.  Keep all your prenatal visits as told by your health care provider. This is important. Safety  Wear your seat belt at  all times when driving.  Make a list of emergency phone numbers, including numbers for family, friends, the hospital, and police and fire departments. General instructions  Avoid cat litter boxes and soil used by cats. These carry germs that can cause birth defects in the baby. If you have a cat, ask someone to clean the litter box for you.  Do not travel far distances unless it is absolutely necessary and only with the approval of your health care provider.  Do not use hot tubs, steam rooms, or saunas.  Do not drink alcohol.  Do not use any products that contain nicotine or tobacco, such as cigarettes and e-cigarettes. If you need help quitting, ask your health care provider.  Do not use any medicinal herbs or unprescribed drugs. These chemicals affect the formation and growth of the baby.  Do not douche or use tampons or scented sanitary pads.  Do not cross your legs for long periods of time.  To prepare for the arrival of your baby: ? Take prenatal classes to understand, practice, and ask questions about labor and delivery. ? Make a trial run to the hospital. ? Visit the hospital and tour the maternity area. ? Arrange for maternity or paternity leave through employers. ? Arrange for family and friends to take care of pets while you are in the hospital. ? Purchase a rear-facing car seat and make sure you know how to install it in your car. ? Pack your hospital bag. ? Prepare the baby's nursery. Make sure to remove all pillows and stuffed animals from the baby's crib to prevent suffocation.  Visit your dentist if you have not gone during your pregnancy. Use a soft toothbrush to brush your teeth and be gentle when you floss. Contact a health care provider if:  You are unsure if you are in labor or if your water has broken.  You become dizzy.  You have mild pelvic cramps, pelvic pressure, or nagging pain in your abdominal area.  You have lower back pain.  You have persistent  nausea, vomiting, or diarrhea.  You have an unusual or bad smelling vaginal discharge.  You have pain when you urinate. Get help right away if:  Your water breaks before 37 weeks.  You have regular contractions less than 5 minutes apart before 37 weeks.  You have a fever.  You are leaking fluid from your vagina.  You have spotting or bleeding from your vagina.  You have severe abdominal pain or cramping.  You have rapid weight loss or weight gain.    You have shortness of breath with chest pain.  You notice sudden or extreme swelling of your face, hands, ankles, feet, or legs.  Your baby makes fewer than 10 movements in 2 hours.  You have severe headaches that do not go away when you take medicine.  You have vision changes. Summary  The third trimester is from week 28 through week 40, months 7 through 9. The third trimester is a time when the unborn baby (fetus) is growing rapidly.  During the third trimester, your discomfort may increase as you and your baby continue to gain weight. You may have abdominal, leg, and back pain, sleeping problems, and an increased need to urinate.  During the third trimester your breasts will keep growing and they will continue to become tender. A yellow fluid (colostrum) may leak from your breasts. This is the first milk you are producing for your baby.  False labor is a condition in which you feel small, irregular tightenings of the muscles in the womb (contractions) that eventually go away. These are called Braxton Hicks contractions. Contractions may last for hours, days, or even weeks before true labor sets in.  Signs of labor can include: abdominal cramps; regular contractions that start at 10 minutes apart and become stronger and more frequent with time; watery or bloody mucus discharge that comes from the vagina; increased pelvic pressure and dull back pain; and leaking of amniotic fluid. This information is not intended to replace advice  given to you by your health care provider. Make sure you discuss any questions you have with your health care provider. Document Released: 09/10/2001 Document Revised: 02/22/2016 Document Reviewed: 11/17/2012 Elsevier Interactive Patient Education  2017 [employer]  

## 2018-06-24 NOTE — MAU Provider Note (Signed)
History     CSN: [id number]  Arrival date and time: 06/24/18 1451   First Provider Initiated Contact with Patient 06/24/18 1532      Chief Complaint  Patient presents with  . Rupture of Membranes  . Back Pain   Candice White is a 27 y.o. G2P1001 at [redacted]w[redacted]d who presents today with leaking of fluid. She states around 1200 today she had a large gush of clear fluid. She left work at that time. She states that since yesterday she has had contractions off and on. She denies any VB. She reports normal fetal movement. She denies any complications with this pregnancy, but that she had an [place] in the office last week. She is unsure of the reason for the ultrasound.  Vaginal Discharge  The patient's primary symptoms include vaginal discharge. This is a new problem. The current episode started today. The problem occurs constantly. The problem has been unchanged. The patient is experiencing no pain. She is pregnant. The vaginal discharge was watery. There has been no bleeding. Nothing aggravates the symptoms. She has tried nothing for the symptoms. Sexual activity: denies intercourse in the last 24 hours.     OB History    Gravida  2   Para  1   Term  1   Preterm      AB      Living  1     SAB      TAB      Ectopic      Multiple  0   Live Births  1           Past Medical History:  Diagnosis Date  . BV (bacterial vaginosis)   . Migraine    chronic ha   . PID (acute pelvic inflammatory disease)     Past Surgical History:  Procedure Laterality Date  . NO PAST SURGERIES      Family History  Problem Relation Age of Onset  . Cancer Maternal Aunt        lung cancer  . Cancer Cousin        lung  . Hypertension Mother     Social History   Tobacco Use  . Smoking status: Former Smoker    Years: 3.00    Last attempt to quit: 12/08/2012    Years since quitting: 5.5  . Smokeless tobacco: Former Neurosurgeon    Quit date: 10/02/2014  Substance Use Topics  . Alcohol use: Not  Currently    Comment: in past  . Drug use: Not Currently    Types: Marijuana    Comment: last smoked was when found out was pregnant with current pregnancy     Allergies: No Known Allergies  Medications Prior to Admission  Medication Sig Dispense Refill Last Dose  . miconazole (MONISTAT 7) 2 % vaginal cream Place 1 Applicatorful vaginally at bedtime. 45 g 0 12/23/2017 at Unknown time  . Prenatal Vit-Fe Fumarate-FA (PRENATAL COMPLETE) 14-0.4 MG TABS Take 1 tablet by mouth daily. 60 each 0 Has not started    Review of Systems  Genitourinary: Positive for vaginal discharge.   Physical Exam   Blood pressure 107/63, pulse 92, temperature 98.7 F (37.1 C), resp. rate 16, weight 59.7 kg, SpO2 100 %.  Physical Exam  Nursing note and vitals reviewed. Constitutional: She is oriented to person, place, and time. She appears well-developed and well-nourished. No distress.  HENT:  Head: Normocephalic.  Cardiovascular: Normal rate.  Respiratory: Effort normal.  GI: Soft. There  is no tenderness. There is no rebound.  Genitourinary:  Genitourinary Comments:  External: no lesion Vagina: small amount of white discharge. No pooling of fluid  Cervix: pink, smooth, no fluid seen with valsalva. Closed/thick Uterus: AGA   Neurological: She is alert and oriented to person, place, and time.  Skin: Skin is warm and dry.  Psychiatric: She has a normal mood and affect.   NST:  Baseline: 150 Variability: moderate Accels: 10x10 Decels: none Toco: rare Difficult to trace, and not a full 20 min connected tracing due to PACs   BPP:8/8  Results for orders placed or performed during the hospital encounter of 06/24/18 (from the past 24 hour(s))  Wet prep, genital     Status: Abnormal   Collection Time: 06/24/18  3:39 PM  Result Value Ref Range   Yeast Wet Prep HPF POC NONE SEEN NONE SEEN   Trich, Wet Prep NONE SEEN NONE SEEN   Clue Cells Wet Prep HPF POC NONE SEEN NONE SEEN   WBC, Wet Prep HPF  POC MODERATE (A) NONE SEEN   Sperm NONE SEEN   Fern Test     Status: Normal   Collection Time: 06/24/18  4:41 PM  Result Value Ref Range   POCT Fern Test Negative = intact amniotic membranes    [place] Mfm Ob Limited  Result Date: 06/24/2018 ----------------------------------------------------------------------  OBSTETRICS REPORT                       (Signed Final 06/24/2018 04:40 pm) ---------------------------------------------------------------------- Patient Info  ID #:       [id number]                          D.O.B.:  [birth year] (27 yrs)  Name:       Candice White                   Visit Date: 06/24/2018 04:02 pm ---------------------------------------------------------------------- Performed By  Performed By:     Lenise Arena        Ref. Address:     Faculty                    RDMS  Attending:        Lin Landsman      Location:         [facility]                    MD  Referred By:      Catalina Antigua MD ---------------------------------------------------------------------- Orders   #  Description                          Code         Ordered By   1  [place] MFM OB LIMITED                    29528.41     Thressa Sheller  ----------------------------------------------------------------------   #  Order #                    Accession #                 Episode #   1  [id number]  [id number]                  [id number]  ---------------------------------------------------------------------- Indications   [redacted] weeks gestation of pregnancy                Z3A.32   Fetal arrhythmia affecting pregnancy,          O36.8390   antepartum  ---------------------------------------------------------------------- Vital Signs  Weight (lb): 131                               Height:        5'3"  BMI:         23.2 ---------------------------------------------------------------------- Fetal Evaluation  Num Of Fetuses:         1  Fetal Heart Rate(bpm):  151  Cardiac Activity:       Arrhythmia  noted  Presentation:           Cephalic  Placenta:               Anterior  Amniotic Fluid  AFI FV:      Within normal limits  AFI Sum(cm)     %Tile       Largest Pocket(cm)  17.42           64          6.51  RUQ(cm)       RLQ(cm)       LUQ(cm)        LLQ(cm)  1.78          6.51          5.38           3.75 ---------------------------------------------------------------------- OB History  Gravidity:    2         Term:   1  Living:       1 ---------------------------------------------------------------------- Gestational Age  Best:          32w 6d     Det. By:  Previous Ultrasound      EDD:   08/13/18                                      (12/21/17) ---------------------------------------------------------------------- Anatomy  Thoracic:              Appears normal         Stomach:                Appears normal, left                                                                        sided  Heart:                 Appears normal         Bladder:                Appears normal                         (4CH, axis, and situs ---------------------------------------------------------------------- Cervix Uterus Adnexa  Cervix  Not visualized (advanced  GA >24wks) ---------------------------------------------------------------------- Comments  I was present for the second portion of the exam. Initially with  the power doppler we observed likely premature atrial beats  every 4 beats for approximately 20 seconds and then the  heart rate converted to a normal rate for 30-60 second. We  then switched to m-mode and the PAC's resolved. We  monitored the heart rate for an additional 5 minutes without  return to of the premature atrial beat.  Ms. Colella denied history of caffine intake or any other  stimulant. She does drink a fair about of tea but has not for 2-  3 days.  I discussed the normal common nature of PAC's and that  they typically resolve and do not convert to a persistent  tachycardia. Nevertheless I recommed follow up at  her  prental visit with an in office fetal auscultation for 1 minute. ---------------------------------------------------------------------- Impression  Premature atrial beats ---------------------------------------------------------------------- Recommendations  Follow up as clinically indicated  Fetal auscultation at next prenatal care visit for 1 minute. ----------------------------------------------------------------------               Lin Landsman, MD Electronically Signed Final Report   06/24/2018 04:40 pm ----------------------------------------------------------------------  MAU Course  Procedures  MDM 4:42 PM MFM consulted and he has come down to see the patient. He noted some PACs when he first saw the patient, but after a period of time they resolved. He has not noted any further PACs at this time. Will attempt to get a FHR tracing at this time, but he feels the patient is ok for outpatient FU if exam for R/O rupture is negative.   5:07 PM consult with Dr. Su Hilt. Patient is ok for DC home, but she would like a BPP resulted.   Assessment and Plan   1. Encounter for suspected premature rupture of amniotic membranes, with rupture of membranes not found   2. [redacted] weeks gestation of pregnancy   3. Fetal arrhythmia affecting pregnancy, antepartum    DC home Comfort measures reviewed  3rd Trimester precautions  PTL precautions  Fetal kick counts RX: none  Return to MAU as needed FU with OB as planned  Follow-up Information    [facility], [facility] Follow up.   Specialty:  Obstetrics and Gynecology Contact information: [address] [place] [address] [phone]            Thressa Sheller 06/24/2018, 4:42 PM

## 2018-06-24 NOTE — MAU Note (Signed)
Urine in lab 

## 2018-06-24 NOTE — MAU Note (Signed)
Pt states that she was sitting at work when she felt her underwear was wet. She went home took a shower and changed clothes and again her underwear was wet.   Pt reports back intermittent back pain 6/10  Denies vaginal bleeding, reports +FM

## 2018-06-25 LAB — GC/CHLAMYDIA PROBE AMP (~~LOC~~) NOT AT ARMC
Chlamydia: NEGATIVE
NEISSERIA GONORRHEA: NEGATIVE

## 2018-08-07 ENCOUNTER — Encounter (HOSPITAL_COMMUNITY): Payer: Self-pay

## 2018-08-07 ENCOUNTER — Inpatient Hospital Stay (HOSPITAL_COMMUNITY)
Admission: AD | Admit: 2018-08-07 | Discharge: 2018-08-09 | DRG: 807 | Disposition: A | Discharge status: Home / Self Care | Payer: Medicaid Other | Attending: Obstetrics & Gynecology | Admitting: Obstetrics & Gynecology

## 2018-08-07 ENCOUNTER — Other Ambulatory Visit: Payer: Self-pay

## 2018-08-07 DIAGNOSIS — O9902 Anemia complicating childbirth: Secondary | ICD-10-CM | POA: Diagnosis present

## 2018-08-07 DIAGNOSIS — Z87891 Personal history of nicotine dependence: Secondary | ICD-10-CM

## 2018-08-07 DIAGNOSIS — O99344 Other mental disorders complicating childbirth: Secondary | ICD-10-CM | POA: Diagnosis present

## 2018-08-07 DIAGNOSIS — F411 Generalized anxiety disorder: Secondary | ICD-10-CM | POA: Diagnosis present

## 2018-08-07 DIAGNOSIS — O26893 Other specified pregnancy related conditions, third trimester: Secondary | ICD-10-CM | POA: Diagnosis not present

## 2018-08-07 DIAGNOSIS — O4290 Premature rupture of membranes, unspecified as to length of time between rupture and onset of labor, unspecified weeks of gestation: Secondary | ICD-10-CM | POA: Diagnosis present

## 2018-08-07 DIAGNOSIS — O4292 Full-term premature rupture of membranes, unspecified as to length of time between rupture and onset of labor: Principal | ICD-10-CM | POA: Diagnosis present

## 2018-08-07 DIAGNOSIS — Z3A39 39 weeks gestation of pregnancy: Secondary | ICD-10-CM | POA: Diagnosis not present

## 2018-08-07 DIAGNOSIS — F32A Depression, unspecified: Secondary | ICD-10-CM | POA: Diagnosis present

## 2018-08-07 DIAGNOSIS — N898 Other specified noninflammatory disorders of vagina: Secondary | ICD-10-CM | POA: Diagnosis not present

## 2018-08-07 DIAGNOSIS — F329 Major depressive disorder, single episode, unspecified: Secondary | ICD-10-CM | POA: Diagnosis present

## 2018-08-07 DIAGNOSIS — D649 Anemia, unspecified: Secondary | ICD-10-CM | POA: Diagnosis present

## 2018-08-07 HISTORY — DX: Premature rupture of membranes, unspecified as to length of time between rupture and onset of labor, unspecified weeks of gestation: O42.90

## 2018-08-07 LAB — AMNISURE RUPTURE OF MEMBRANE (ROM) NOT AT ARMC: Amnisure ROM: POSITIVE

## 2018-08-07 LAB — POCT FERN TEST: POCT FERN TEST: NEGATIVE

## 2018-08-07 MED ORDER — ONDANSETRON HCL 4 MG/2ML IJ SOLN: 4.0000 mg | Freq: Four times a day (QID) | INTRAMUSCULAR | Status: DC | PRN

## 2018-08-07 MED ORDER — ACETAMINOPHEN 325 MG PO TABS: 650.0000 mg | ORAL_TABLET | ORAL | Status: DC | PRN

## 2018-08-07 MED ORDER — OXYTOCIN 40 UNITS IN LACTATED RINGERS INFUSION - SIMPLE MED: 2.5000 [IU]/h | INTRAVENOUS | Status: DC

## 2018-08-07 MED ORDER — SOD CITRATE-CITRIC ACID 500-334 MG/5ML PO SOLN: 30.0000 mL | ORAL | Status: DC | PRN

## 2018-08-07 MED ORDER — LACTATED RINGERS IV SOLN
INTRAVENOUS | Status: DC
  Administered 2018-08-07: 23:00:00 via INTRAVENOUS

## 2018-08-07 MED ORDER — LIDOCAINE HCL (PF) 1 % IJ SOLN
30.0000 mL | INTRAMUSCULAR | Status: DC | PRN
  Filled 2018-08-07: qty 30

## 2018-08-07 MED ORDER — OXYCODONE-ACETAMINOPHEN 5-325 MG PO TABS: 2.0000 | ORAL_TABLET | ORAL | Status: DC | PRN

## 2018-08-07 MED ORDER — OXYCODONE-ACETAMINOPHEN 5-325 MG PO TABS: 1.0000 | ORAL_TABLET | ORAL | Status: DC | PRN

## 2018-08-07 MED ORDER — TERBUTALINE SULFATE 1 MG/ML IJ SOLN
0.2500 mg | Freq: Once | INTRAMUSCULAR | Status: DC | PRN
  Filled 2018-08-07: qty 1

## 2018-08-07 MED ORDER — LACTATED RINGERS IV SOLN: 500.0000 mL | INTRAVENOUS | Status: DC | PRN

## 2018-08-07 MED ORDER — OXYTOCIN BOLUS FROM INFUSION
500.0000 mL | Freq: Once | INTRAVENOUS | Status: AC
  Administered 2018-08-08: 500 mL via INTRAVENOUS

## 2018-08-07 MED ORDER — FENTANYL CITRATE (PF) 100 MCG/2ML IJ SOLN: 50.0000 ug | INTRAMUSCULAR | Status: DC | PRN

## 2018-08-07 MED ORDER — OXYTOCIN 40 UNITS IN LACTATED RINGERS INFUSION - SIMPLE MED
1.0000 m[IU]/min | INTRAVENOUS | Status: DC
  Administered 2018-08-07: 2 m[IU]/min via INTRAVENOUS
  Filled 2018-08-07: qty 1000

## 2018-08-07 NOTE — MAU Note (Signed)
Has been contracting all day. Small amt of blood and mucous, around 0400  Woke up and her drawers were soaked, clear fluid- has continued. wasd almost 3 when last checked.  No problems with preg.

## 2018-08-07 NOTE — MAU Provider Note (Signed)
S: Ms. Candice White is a 27 y.o. G2P1001 at [redacted]w[redacted]d  who presents to MAU today complaining of leaking of fluid since 0400 that has continued all day. She denies vaginal bleeding. She endorses contractions. She reports normal fetal movement.    O: BP 110/67 (BP Location: Right Arm)   Pulse (!) 102   Temp 98.2 F (36.8 C) (Oral)   Resp 17   Wt 62.6 kg   LMP  (LMP Unknown)   SpO2 100%   BMI 24.45 kg/m  GENERAL: Well-developed, well-nourished female in no acute distress.  HEAD: Normocephalic, atraumatic.  CHEST: Normal effort of breathing, regular heart rate ABDOMEN: Soft, nontender, gravid PELVIC: Normal external female genitalia. Vagina is pink and rugated. Cervix with normal contour, no lesions. Normal discharge.  Amnisure done: positive.   Cervical exam:   Not evaluated due to ROM   Fetal Monitoring: Baseline: 150 Variability: mod Accelerations:present Decelerations: absent Contractions: irregular  Results for orders placed or performed during the hospital encounter of 08/07/18 (from the past 24 hour(s))  Amnisure rupture of membrane (rom)not at [facility]     Status: None   Collection Time: 08/07/18  6:47 PM  Result Value Ref Range   Amnisure ROM POSITIVE   Fern Test     Status: None   Collection Time: 08/07/18  7:00 PM  Result Value Ref Range   POCT Fern Test Negative = intact amniotic membranes      A: [facility] at [redacted]w[redacted]d  [facility]  P: Fulton Mole, MD to recommend admission based on Amnisure and patient report. Sallye Ober agrees; RN will call [name] to admit patient.   Marylene Land, CNM 08/07/2018 8:23 PM

## 2018-08-07 NOTE — H&P (Signed)
Candice White is a 27 y.o. female presenting for premature rupture of membranes. OB History    Gravida  2   Para  1   Term  1   Preterm      AB      Living  1     SAB      TAB      Ectopic      Multiple  0   Live Births  1          Past Medical History:  Diagnosis Date  . BV (bacterial vaginosis)   . Migraine    chronic ha   . PID (acute pelvic inflammatory disease)    Past Surgical History:  Procedure Laterality Date  . NO PAST SURGERIES     Family History: family history includes Cancer in her cousin and maternal aunt; Hypertension in her mother. Social History:  reports that she quit smoking about 5 years ago. She quit after 3.00 years of use. She quit smokeless tobacco use about 3 years ago. She reports that she drank alcohol. She reports that she has current or past drug history. Drug: Marijuana.    Maternal Diabetes: No Genetic Screening: Normal Maternal Ultrasounds/Referrals: Normal Fetal Ultrasounds or other Referrals:  None Maternal Substance Abuse:  No Significant Maternal Medications:  None Significant Maternal Lab Results:  None Other Comments:  None  Review of Systems  All other systems reviewed and are negative.    Maternal Medical History:  Reason for admission: Rupture of membranes.   Contractions: Onset was 13-24 hours ago.   Frequency: irregular.   Perceived severity is mild.    Fetal activity: Perceived fetal activity is normal.   Last perceived fetal movement was within the past hour.    Prenatal Complications - Diabetes: none.     Vitals:   08/07/18 1752 08/07/18 2058  BP: 110/67   Pulse: (!) 102   Resp: 17 16  Temp: 98.2 F (36.8 C) 98.2 F (36.8 C)  TempSrc: Oral Oral  SpO2: 100%   Weight: 62.6 kg    Results for orders placed or performed during the hospital encounter of 08/07/18 (from the past 24 hour(s))  Amnisure rupture of membrane (rom)not at [facility]     Status: None   Collection Time: 08/07/18  6:47 PM   Result Value Ref Range   Amnisure ROM POSITIVE   Fern Test     Status: None   Collection Time: 08/07/18  7:00 PM  Result Value Ref Range   POCT Fern Test Negative = intact amniotic membranes     Maternal Exam:  Uterine Assessment: Contraction strength is mild.  Contraction frequency is irregular.   Abdomen: Patient reports no abdominal tenderness. Fundal height is Size=dates.   Estimated fetal weight is 7lbs.   Fetal presentation: vertex  Introitus: Normal vulva. Normal vagina.  Pelvis: adequate for delivery.   Cervix: Cervix evaluated by digital exam.     Fetal Exam Fetal Monitor Review: Mode: ultrasound.   Baseline rate: 140.  Variability: moderate (6-25 bpm).   Pattern: accelerations present and no decelerations.    Fetal State Assessment: Category I - tracings are normal.     Physical Exam  Nursing note and vitals reviewed. Constitutional: She is oriented to person, place, and time. She appears well-developed and well-nourished.  HENT:  Head: Normocephalic and atraumatic.  Eyes: Pupils are equal, round, and reactive to light.  Cardiovascular: Normal rate, regular rhythm and normal heart sounds.  Respiratory: Effort normal  and breath sounds normal.  GI: There is no tenderness.  Genitourinary: Vagina normal and uterus normal.  Musculoskeletal: Normal range of motion.  Neurological: She is alert and oriented to person, place, and time.  Skin: Skin is warm and dry.  Psychiatric: She has a normal mood and affect. Her behavior is normal. Judgment and thought content normal.    Cervical exam: 3-4/70/-2 vertex, midposition, medium consistency   Prenatal labs: ABO, Rh:  B+ Antibody:  Negative Rubella:  Immune RPR:   NR HBsAg:   NR HIV: Non Reactive (03/24 1545)  GBS:   Negative   Assessment/Plan: 27 y.o. G2P1 at [redacted]w[redacted]d Premature rupture of membranes Category 1 FHTs Admit to L&D Pitocin augmentation of labor Anticipate NSVD     Janeece Riggers 08/07/2018,  9:48 PM

## 2018-08-08 ENCOUNTER — Inpatient Hospital Stay (HOSPITAL_COMMUNITY): Payer: Medicaid Other | Admitting: Anesthesiology

## 2018-08-08 ENCOUNTER — Encounter (HOSPITAL_COMMUNITY): Payer: Self-pay | Admitting: Advanced Practice Midwife

## 2018-08-08 DIAGNOSIS — Z3A39 39 weeks gestation of pregnancy: Secondary | ICD-10-CM

## 2018-08-08 DIAGNOSIS — O4290 Premature rupture of membranes, unspecified as to length of time between rupture and onset of labor, unspecified weeks of gestation: Secondary | ICD-10-CM | POA: Diagnosis not present

## 2018-08-08 HISTORY — DX: Premature rupture of membranes, unspecified as to length of time between rupture and onset of labor, unspecified weeks of gestation: O42.90

## 2018-08-08 LAB — CBC
HEMATOCRIT: 31.4 % — AB (ref 36.0–46.0)
Hemoglobin: 10.2 g/dL — ABNORMAL LOW (ref 12.0–15.0)
MCH: 28.7 pg (ref 26.0–34.0)
MCHC: 32.5 g/dL (ref 30.0–36.0)
MCV: 88.5 fL (ref 80.0–100.0)
NRBC: 0 % (ref 0.0–0.2)
Platelets: 272 10*3/uL (ref 150–400)
RBC: 3.55 MIL/uL — ABNORMAL LOW (ref 3.87–5.11)
RDW: 13.5 % (ref 11.5–15.5)
WBC: 11.6 10*3/uL — AB (ref 4.0–10.5)

## 2018-08-08 LAB — TYPE AND SCREEN
ABO/RH(D): B POS
ANTIBODY SCREEN: NEGATIVE

## 2018-08-08 LAB — RPR: RPR Ser Ql: NONREACTIVE

## 2018-08-08 MED ORDER — DIPHENHYDRAMINE HCL 50 MG/ML IJ SOLN
INTRAMUSCULAR | Status: AC
  Filled 2018-08-08: qty 1

## 2018-08-08 MED ORDER — DIPHENHYDRAMINE HCL 25 MG PO CAPS: 25.0000 mg | ORAL_CAPSULE | Freq: Four times a day (QID) | ORAL | Status: DC | PRN

## 2018-08-08 MED ORDER — PHENYLEPHRINE 40 MCG/ML (10ML) SYRINGE FOR IV PUSH (FOR BLOOD PRESSURE SUPPORT)
80.0000 ug | PREFILLED_SYRINGE | INTRAVENOUS | Status: DC | PRN
  Filled 2018-08-08: qty 5

## 2018-08-08 MED ORDER — FENTANYL 2.5 MCG/ML BUPIVACAINE 1/10 % EPIDURAL INFUSION (WH - ANES)
INTRAMUSCULAR | Status: AC
  Filled 2018-08-08: qty 100

## 2018-08-08 MED ORDER — PRENATAL MULTIVITAMIN CH
1.0000 | ORAL_TABLET | Freq: Every day | ORAL | Status: DC
  Administered 2018-08-09: 1 via ORAL
  Filled 2018-08-08: qty 1

## 2018-08-08 MED ORDER — BENZOCAINE-MENTHOL 20-0.5 % EX AERO
1.0000 "application " | INHALATION_SPRAY | CUTANEOUS | Status: DC | PRN
  Administered 2018-08-08: 1 via TOPICAL
  Filled 2018-08-08: qty 56

## 2018-08-08 MED ORDER — IBUPROFEN 600 MG PO TABS
600.0000 mg | ORAL_TABLET | Freq: Four times a day (QID) | ORAL | Status: DC
  Administered 2018-08-08 – 2018-08-09 (×4): 600 mg via ORAL
  Filled 2018-08-08 (×4): qty 1

## 2018-08-08 MED ORDER — EPHEDRINE 5 MG/ML INJ
INTRAVENOUS | Status: AC
  Filled 2018-08-08: qty 4

## 2018-08-08 MED ORDER — DIPHENHYDRAMINE HCL 50 MG/ML IJ SOLN
25.0000 mg | Freq: Four times a day (QID) | INTRAMUSCULAR | Status: DC | PRN
  Administered 2018-08-08: 25 mg via INTRAVENOUS

## 2018-08-08 MED ORDER — TETANUS-DIPHTH-ACELL PERTUSSIS 5-2.5-18.5 LF-MCG/0.5 IM SUSP: 0.5000 mL | Freq: Once | INTRAMUSCULAR | Status: DC

## 2018-08-08 MED ORDER — DIBUCAINE 1 % RE OINT: 1.0000 "application " | TOPICAL_OINTMENT | RECTAL | Status: DC | PRN

## 2018-08-08 MED ORDER — DIPHENHYDRAMINE HCL 50 MG/ML IJ SOLN: 12.5000 mg | INTRAMUSCULAR | Status: DC | PRN

## 2018-08-08 MED ORDER — EPHEDRINE 5 MG/ML INJ
10.0000 mg | INTRAVENOUS | Status: DC | PRN
  Filled 2018-08-08: qty 2

## 2018-08-08 MED ORDER — ZOLPIDEM TARTRATE 5 MG PO TABS: 5.0000 mg | ORAL_TABLET | Freq: Every evening | ORAL | Status: DC | PRN

## 2018-08-08 MED ORDER — LIDOCAINE HCL (PF) 1 % IJ SOLN
INTRAMUSCULAR | Status: DC | PRN
  Administered 2018-08-08 (×2): 4 mL via EPIDURAL

## 2018-08-08 MED ORDER — COCONUT OIL OIL: 1.0000 "application " | TOPICAL_OIL | Status: DC | PRN

## 2018-08-08 MED ORDER — FENTANYL 2.5 MCG/ML BUPIVACAINE 1/10 % EPIDURAL INFUSION (WH - ANES)
INTRAMUSCULAR | Status: DC | PRN
  Administered 2018-08-08: 13 mL/h via EPIDURAL

## 2018-08-08 MED ORDER — ONDANSETRON HCL 4 MG PO TABS: 4.0000 mg | ORAL_TABLET | ORAL | Status: DC | PRN

## 2018-08-08 MED ORDER — FENTANYL 2.5 MCG/ML BUPIVACAINE 1/10 % EPIDURAL INFUSION (WH - ANES)
14.0000 mL/h | INTRAMUSCULAR | Status: DC | PRN
  Administered 2018-08-08: 14 mL/h via EPIDURAL

## 2018-08-08 MED ORDER — WITCH HAZEL-GLYCERIN EX PADS: 1.0000 "application " | MEDICATED_PAD | CUTANEOUS | Status: DC | PRN

## 2018-08-08 MED ORDER — LACTATED RINGERS IV SOLN: 500.0000 mL | Freq: Once | INTRAVENOUS | Status: DC

## 2018-08-08 MED ORDER — SENNOSIDES-DOCUSATE SODIUM 8.6-50 MG PO TABS
2.0000 | ORAL_TABLET | ORAL | Status: DC
  Administered 2018-08-08: 2 via ORAL
  Filled 2018-08-08: qty 2

## 2018-08-08 MED ORDER — SIMETHICONE 80 MG PO CHEW: 80.0000 mg | CHEWABLE_TABLET | ORAL | Status: DC | PRN

## 2018-08-08 MED ORDER — PHENYLEPHRINE 40 MCG/ML (10ML) SYRINGE FOR IV PUSH (FOR BLOOD PRESSURE SUPPORT)
PREFILLED_SYRINGE | INTRAVENOUS | Status: AC
  Filled 2018-08-08: qty 10

## 2018-08-08 MED ORDER — ACETAMINOPHEN 325 MG PO TABS: 650.0000 mg | ORAL_TABLET | ORAL | Status: DC | PRN

## 2018-08-08 MED ORDER — ONDANSETRON HCL 4 MG/2ML IJ SOLN: 4.0000 mg | INTRAMUSCULAR | Status: DC | PRN

## 2018-08-08 NOTE — Progress Notes (Signed)
Candice White is a 27 y.o. G2P1001 at [redacted]w[redacted]d admitted for PROM at 0400.   Subjective: Patient feeling moderate strength contractions.   Objective: BP 108/67   Pulse 77   Temp 98.3 F (36.8 C) (Oral)   Resp 16   Wt 62.6 kg   LMP  (LMP Unknown)   SpO2 100%   BMI 24.45 kg/m  No intake/output data recorded. No intake/output data recorded.  FHT:  FHR: 130 bpm, variability: moderate,  accelerations:  Present,  decelerations:  Absent UC:   regular, every 2-3 minutes SVE:   Dilation: 4 Effacement (%): 80 Station: -2 Exam by:: Waynette Buttery, CNM  Labs: Lab Results  Component Value Date   WBC 11.6 (H) 08/08/2018   HGB 10.2 (L) 08/08/2018   HCT 31.4 (L) 08/08/2018   MCV 88.5 08/08/2018   PLT 272 08/08/2018    Assessment / Plan: Augmentation of labor, progressing well  Labor: Progressing on Pitocin, will continue to increase then AROM Preeclampsia:  no signs or symptoms of toxicity, intake and ouput balanced and labs stable Fetal Wellbeing:  Category I Pain Control:  Labor support without medications I/D:  n/a Anticipated MOD:  NSVD  Janeece Riggers 08/08/2018, 2:35 AM

## 2018-08-08 NOTE — Progress Notes (Signed)
Candice White is a 27 y.o. G2P1001 at [redacted]w[redacted]d admitted for Preterm labor, PROM  Subjective: Feeling increased rectal pressure. Asked to have epidural rate turned down and that was done recently. She is able to move legs, not feeling pain. No other c/o  Objective: BP (!) 98/54   Pulse 97   Temp 98 F (36.7 C) (Axillary)   Resp 16   Wt 62.6 kg   LMP  (LMP Unknown)   SpO2 100%   BMI 24.45 kg/m  I/O last 3 completed shifts: In: -  Out: 200 [Urine:200] No intake/output data recorded.  FHT:  FHR wnl, moderate variability, no decels UC:   regular, 5/10 minutes; MVU ~200 SVE:   Dilation: 8 Effacement (%): 90 Station: 0 Exam by:: Candice White, CNM  Labs: Lab Results  Component Value Date   WBC 11.6 (H) 08/08/2018   HGB 10.2 (L) 08/08/2018   HCT 31.4 (L) 08/08/2018   MCV 88.5 08/08/2018   PLT 272 08/08/2018    Assessment / Plan: Protracted active phase - continues to make slow progress Prolonged ROM - no s/s chorio  Labor: continue pitocin; holding dose due to ctx adequacy Fetal Wellbeing:  Category I Pain Control:  Epidural I/D:  n/a Anticipated MOD:  NSVD  Candice White Candice White 08/08/2018, 12:25 PM

## 2018-08-08 NOTE — Lactation Note (Addendum)
This note was copied from a baby's chart. Lactation Consultation Note  Patient Name: Candice White [id number] Date: 08/08/2018 Reason for consult: Initial assessment;Term  7 hours old FT female who is being exclusively BF by her mother, she's a P2 and experienced BF. She was able to BF her first born for 4 months and she's already familiar with hand expression. When reviewing hand expression with mom, she was able to get a big drop of colostrum out of her right breast, LC showed parents how to finger feed baby. Mom participated in the [facility] program at the [facility]; she doesn't have a pump at home, [facility] offered a hand pump from the hospital; pump instructions, cleaning and storage were reviewed, as well as milk storage guidelines.  Offered assistance with latch, and mom agreed to have baby STS. When waking baby up to feed; she had a bowel movement. Documented in Flowsheets. Noticed that mom had a pacifier in the crib, explained to parents about the risks of offering baby a pacifier before BF is well established. LC had to wait for mom to come out of the bathroom, she needed assistance with latch, baby hasn't fed in the last 3 hours. LC took baby STS to mom's breast in cross cradle position and she was able to latch on after a few tries. No audible swallows heard at this point though, baby kept nursing and off; she was still nursing when exiting the room.  Feeding plan:  1. Encouraged mom to feed baby STS 8-12 times/24 hours or sooner if feeding cues are present 2. Hand expression and spoon feeding was also encouraged  BF brochure, BF resources and feeding diary were reviewed. Parents reported all questions and concerns were answered, they're both aware of LC services and will call PRN.    Maternal Data Formula Feeding for Exclusion: No Has patient been taught Hand Expression?: Yes Does the patient have breastfeeding experience prior to this delivery?: Yes  Feeding   Interventions Interventions:  Breast feeding basics reviewed;Hand express;Breast compression;Breast massage;Hand pump  Lactation Tools Discussed/Used Tools: Pump Breast pump type: Manual WIC Program: Yes Pump Review: Setup, frequency, and cleaning;Milk Storage Initiated by:: MPeck Date initiated:: 08/08/18   Consult Status Consult Status: Follow-up Date: 08/09/18 Follow-up type: In-patient    Jakobi Thetford Venetia Constable 08/08/2018, 9:51 PM

## 2018-08-08 NOTE — Anesthesia Procedure Notes (Signed)
Epidural Patient location during procedure: OB Start time: 08/08/2018 3:03 AM End time: 08/08/2018 3:16 AM  Staffing Anesthesiologist: Kaylyn Layer, MD Performed: anesthesiologist   Preanesthetic Checklist Completed: patient identified, pre-op evaluation, timeout performed, IV checked, risks and benefits discussed and monitors and equipment checked  Epidural Patient position: sitting Prep: site prepped and draped and DuraPrep Patient monitoring: continuous pulse ox, blood pressure, heart rate and cardiac monitor Approach: midline Location: L3-L4 Injection technique: LOR air  Needle:  Needle type: Tuohy  Needle gauge: 17 G Needle length: 9 cm Needle insertion depth: 5 cm Catheter type: closed end flexible Catheter size: 19 Gauge Catheter at skin depth: 10 cm Test dose: negative and Other (1% lidocaine)  Assessment Events: blood not aspirated, injection not painful, no injection resistance, negative IV test and no paresthesia  Additional Notes Patient identified. Risks, benefits, and alternatives discussed with patient including but not limited to bleeding, infection, nerve damage, paralysis, failed block, incomplete pain control, headache, blood pressure changes, nausea, vomiting, reactions to medication, itching, and postpartum back pain. Confirmed with bedside nurse the patient's most recent platelet count. Confirmed with patient that they are not currently taking any anticoagulation, have any bleeding history, or any family history of bleeding disorders. Patient expressed understanding and wished to proceed. All questions were answered. Sterile technique was used throughout the entire procedure. Please see nursing notes for vital signs. Crisp LOR on first pass. Test dose was given through epidural catheter and negative prior to continuing to dose epidural or start infusion. Warning signs of high block given to the patient including shortness of breath, tingling/numbness in  hands, complete motor block, or any concerning symptoms with instructions to call for help. Patient was given instructions on fall risk and not to get out of bed. All questions and concerns addressed with instructions to call with any issues or inadequate analgesia.  Reason for block:procedure for pain

## 2018-08-08 NOTE — Progress Notes (Signed)
Candice White is a 27 y.o. G2P1001 at [redacted]w[redacted]d by LMP admitted for PROM  Subjective: Patient comfortable  Objective: BP (!) 95/54   Pulse 89   Temp 98.2 F (36.8 C) (Oral)   Resp 18   Wt 62.6 kg   LMP  (LMP Unknown)   SpO2 100%   BMI 24.45 kg/m  I/O last 3 completed shifts: In: -  Out: 200 [Urine:200] No intake/output data recorded.  FHT:  FHR: 125 bpm, variability: moderate,  accelerations:  Present,  decelerations:  Absent UC:   irregular, every 3-4 minutes SVE:   Dilation: 6.5 Effacement (%): 90 Station: 0 Exam by:: Cammie Faulstich, MD IUPC placed  Labs: Lab Results  Component Value Date   WBC 11.6 (H) 08/08/2018   HGB 10.2 (L) 08/08/2018   HCT 31.4 (L) 08/08/2018   MCV 88.5 08/08/2018   PLT 272 08/08/2018    Assessment / Plan: Protracted active phase  Labor: IUPC placed will check MVUs Preeclampsia:  no signs or symptoms of toxicity Fetal Wellbeing:  Category I Pain Control:  Epidural I/D:  n/a Anticipated MOD:  NSVD  Candice White Candice White 08/08/2018, 10:19 AM

## 2018-08-08 NOTE — Progress Notes (Signed)
Assumed care.  Candice White is doing well. Comfortable with epidural Family at bedside.  FHR cat I. Ctx 5/10. Pitocin 33mU/min.  VE 5/8-/-2 @ 0450 by RN. SROM clear fluid since at least 0400 11/8 -- she thinks she may have been leaking since the day prior. GBS neg. No s/s chorio.  1) early labor- suspect she is moving into active labor. Normal labor progress since pitocin initiated. Recommend continue pitocin, hold dose at 18 since ctx seem adequate. Plan VE in 4h or sooner if indicated.  2) prolonged ROM. No s/s chorio. Will limit VE and monitor temp closely. Antibiotics if indicated.

## 2018-08-08 NOTE — Progress Notes (Signed)
Removed foley catheter at 1435. Clear yellow urine noted

## 2018-08-08 NOTE — Progress Notes (Signed)
Candice White is a 27 y.o. G2P1001 at [redacted]w[redacted]d admitted for PROM  Subjective: Feeling increased pressure. No pain. Called to bedside by RN for IUPC out  Objective: BP 104/75   Pulse (!) 106   Temp 98.3 F (36.8 C) (Axillary)   Resp 18   Wt 62.6 kg   LMP  (LMP Unknown)   SpO2 100%   BMI 24.45 kg/m  I/O last 3 completed shifts: In: -  Out: 200 [Urine:200] Total I/O In: -  Out: 825 [Urine:825]  FHT:  FHR wnl. Accels. Some early decels. Moderate variability UC:   5/10 min. IUPC out SVE:   Dilation: Lip/rim Effacement (%): 100 Station: 0 Exam by:: Katie Flara Storti, CNM  Baby LOP. Attempted manual rotation and large amount of fluid escaped - clear. Cervix on right side. Rotated her position to far right lateral with peanut ball.   Labs: Lab Results  Component Value Date   WBC 11.6 (H) 08/08/2018   HGB 10.2 (L) 08/08/2018   HCT 31.4 (L) 08/08/2018   MCV 88.5 08/08/2018   PLT 272 08/08/2018    Assessment / Plan: Augmentation of labor, progressing well  Labor: Progressing normally Preeclampsia:  na Fetal Wellbeing:  Category I Pain Control:  Epidural I/D:  n/a Anticipated MOD:  NSVD  Candice White 08/08/2018, 2:01 PM

## 2018-08-08 NOTE — Progress Notes (Signed)
Candice White is a 27 y.o. G2P1001 at [redacted]w[redacted]d admitted for PROM at 0400.   Subjective: Patient comfortable with epidural.    Objective: BP 109/72   Pulse 89   Temp 98 F (36.7 C) (Oral)   Resp 16   Wt 62.6 kg   LMP  (LMP Unknown)   SpO2 100%   BMI 24.45 kg/m  No intake/output data recorded. No intake/output data recorded.  FHT:  FHR: 130 bpm, variability: moderate,  accelerations:  Present,  decelerations:  Absent UC:   regular, every 2-3 minutes SVE:   Dilation: 5.5 Effacement (%): 80 Station: -2 Exam by:: R. Craft, RN  Labs: Lab Results  Component Value Date   WBC 11.6 (H) 08/08/2018   HGB 10.2 (L) 08/08/2018   HCT 31.4 (L) 08/08/2018   MCV 88.5 08/08/2018   PLT 272 08/08/2018    Assessment / Plan: Augmentation of labor, progressing well  Labor: Progressing on Pitocin, will continue to increase then AROM Preeclampsia:  no signs or symptoms of toxicity, intake and ouput balanced and labs stable Fetal Wellbeing:  Category I Pain Control:  Labor support without medications I/D:  n/a Anticipated MOD:  NSVD  Janeece Riggers 08/08/2018, 6:49 AM

## 2018-08-08 NOTE — Anesthesia Preprocedure Evaluation (Signed)
Anesthesia Evaluation  Patient identified by MRN, date of birth, ID band Patient awake    Reviewed: Allergy & Precautions, Patient's Chart, lab work & pertinent test results  Airway Mallampati: II  TM Distance: >3 FB Neck ROM: Full    Dental no notable dental hx. (+) Teeth Intact   Pulmonary former smoker,    Pulmonary exam normal breath sounds clear to auscultation       Cardiovascular negative cardio ROS Normal cardiovascular exam Rhythm:Regular Rate:Normal     Neuro/Psych Anxiety Depression negative neurological ROS  negative psych ROS   GI/Hepatic negative GI ROS, Neg liver ROS,   Endo/Other  negative endocrine ROS  Renal/GU negative Renal ROS  negative genitourinary   Musculoskeletal negative musculoskeletal ROS (+)   Abdominal   Peds negative pediatric ROS (+)  Hematology  (+) Blood dyscrasia, anemia ,   Anesthesia Other Findings   Reproductive/Obstetrics (+) Pregnancy                             Anesthesia Physical Anesthesia Plan  ASA: II  Anesthesia Plan: Epidural   Post-op Pain Management:    Induction:   PONV Risk Score and Plan: 2 and Treatment may vary due to age or medical condition  Airway Management Planned: Natural Airway  Additional Equipment:   Intra-op Plan:   Post-operative Plan:   Informed Consent: I have reviewed the patients History and Physical, chart, labs and discussed the procedure including the risks, benefits and alternatives for the proposed anesthesia with the patient or authorized representative who has indicated his/her understanding and acceptance.     Plan Discussed with: CRNA  Anesthesia Plan Comments:         Anesthesia Quick Evaluation

## 2018-08-09 LAB — CBC
HCT: 27.9 % — ABNORMAL LOW (ref 36.0–46.0)
Hemoglobin: 9.1 g/dL — ABNORMAL LOW (ref 12.0–15.0)
MCH: 28.4 pg (ref 26.0–34.0)
MCHC: 32.6 g/dL (ref 30.0–36.0)
MCV: 87.2 fL (ref 80.0–100.0)
PLATELETS: 237 10*3/uL (ref 150–400)
RBC: 3.2 MIL/uL — ABNORMAL LOW (ref 3.87–5.11)
RDW: 13.6 % (ref 11.5–15.5)
WBC: 14.5 10*3/uL — ABNORMAL HIGH (ref 4.0–10.5)
nRBC: 0 % (ref 0.0–0.2)

## 2018-08-09 MED ORDER — IBUPROFEN 600 MG PO TABS: 600.0000 mg | ORAL_TABLET | Freq: Four times a day (QID) | ORAL | 0 refills | Status: DC

## 2018-08-09 MED ORDER — FERROUS SULFATE 325 (65 FE) MG PO TABS: 325.0000 mg | ORAL_TABLET | Freq: Every day | ORAL | 3 refills | Status: DC

## 2018-08-09 MED ORDER — ACETAMINOPHEN 325 MG PO TABS: 650.0000 mg | ORAL_TABLET | ORAL | 0 refills | Status: DC | PRN

## 2018-08-09 MED ORDER — DOCUSATE SODIUM 100 MG PO CAPS: 100.0000 mg | ORAL_CAPSULE | Freq: Every day | ORAL | 2 refills | Status: AC | PRN

## 2018-08-09 NOTE — Anesthesia Postprocedure Evaluation (Signed)
Anesthesia Post Note  Patient: Candice White  Procedure(s) Performed: AN AD HOC LABOR EPIDURAL     Patient location during evaluation: Mother Baby Anesthesia Type: Epidural Level of consciousness: awake and alert and oriented Pain management: satisfactory to patient Vital Signs Assessment: post-procedure vital signs reviewed and stable Respiratory status: respiratory function stable Cardiovascular status: stable Postop Assessment: no headache, no backache, epidural receding, patient able to bend at knees, no signs of nausea or vomiting and adequate PO intake Anesthetic complications: no    Last Vitals:  Vitals:   08/09/18 0231 08/09/18 0439  BP: 111/62 94/63  Pulse: 82 70  Resp: 18 18  Temp: 36.7 C 36.7 C  SpO2: 100% 100%    Last Pain:  Vitals:   08/09/18 0534  TempSrc:   PainSc: 0-No pain   Pain Goal:                 Charmagne Buhl

## 2018-08-09 NOTE — Lactation Note (Signed)
This note was copied from a baby's chart. Lactation Consultation Note  Patient Name: Candice White [id number] Date: 08/09/2018 Reason for consult: Follow-up assessment;Term  P2 mother whose infant is now 47 hours old.  Mother is an early discharge today.  Mother stated that she had just finished feeding baby but she was showing cues.  Offered to assist with latching and observing a feeding and mother accepted.  Suggested feeding STS with all feeds (baby has undershirt and blanket on).  Mother prefers the cradle position and I discussed how to obtain a deep latch in this position.  Mother's breasts are soft and non tender and nipples are everted.  Assisted to latch on the right breast without difficulty.  Wide mouth and flanged lips were noted and mother denied pain.  Baby needed gentle stimulation to continue sucking; wanted to sleep at the breast.  Encouraged to continue feeding 8-12 times/24 hours or sooner if baby showed cues.  Discussed cluster feeding.  Engorgement prevention/treatment reviewed.  Mother will be contacting WIC in [place] to obtain a DEBP.  She has a manual pump and knows how to hand express.  OP phone number given for any questions/concerns after discharge.  Father and family present.   Maternal Data Formula Feeding for Exclusion: No Has patient been taught Hand Expression?: Yes Does the patient have breastfeeding experience prior to this delivery?: Yes  Feeding Feeding Type: Breast Fed  LATCH Score Latch: Grasps breast easily, tongue down, lips flanged, rhythmical sucking.  Audible Swallowing: A few with stimulation  Type of Nipple: Everted at rest and after stimulation  Comfort (Breast/Nipple): Soft / non-tender  Hold (Positioning): Assistance needed to correctly position infant at breast and maintain latch.  LATCH Score: 8  Interventions Interventions: Breast feeding basics reviewed;Assisted with latch;Skin to skin;Breast massage;Hand  express;Position options;Support pillows;Adjust position;Breast compression  Lactation Tools Discussed/Used WIC Program: Yes   Consult Status Consult Status: Complete Date: 08/09/18 Follow-up type: Call as needed    Kevonna Nolte R Joleigh Mineau 08/09/2018, 12:45 PM

## 2018-08-09 NOTE — Discharge Instructions (Signed)
Anemia Anemia is a condition in which you do not have enough red blood cells or hemoglobin. Hemoglobin is a substance in red blood cells that carries oxygen. When you do not have enough red blood cells or hemoglobin (are anemic), your body cannot get enough oxygen and your organs may not work properly. As a result, you may feel very tired or have other problems. What are the causes? Common causes of anemia include:  Excessive bleeding. Anemia can be caused by excessive bleeding inside or outside the body, including bleeding from the intestine or from periods in women.  Poor nutrition.  Long-lasting (chronic) kidney, thyroid, and liver disease.  Bone marrow disorders.  Cancer and treatments for cancer.  HIV (human immunodeficiency virus) and AIDS (acquired immunodeficiency syndrome).  Treatments for HIV and AIDS.  Spleen problems.  Blood disorders.  Infections, medicines, and autoimmune disorders that destroy red blood cells.  What are the signs or symptoms? Symptoms of this condition include:  Minor weakness.  Dizziness.  Headache.  Feeling heartbeats that are irregular or faster than normal (palpitations).  Shortness of breath, especially with exercise.  Paleness.  Cold sensitivity.  Indigestion.  Nausea.  Difficulty sleeping.  Difficulty concentrating.  Symptoms may occur suddenly or develop slowly. If your anemia is mild, you may not have symptoms. How is this diagnosed? This condition is diagnosed based on:  Blood tests.  Your medical history.  A physical exam.  Bone marrow biopsy.  Your health care provider may also check your stool (feces) for blood and may do additional testing to look for the cause of your bleeding. You may also have other tests, including:  Imaging tests, such as a CT scan or MRI.  Endoscopy.  Colonoscopy.  How is this treated? Treatment for this condition depends on the cause. If you continue to lose a lot of blood,  you may need to be treated at a hospital. Treatment may include:  Taking supplements of iron, vitamin T02, or folic acid.  Taking a hormone medicine (erythropoietin) that can help to stimulate red blood cell growth.  Having a blood transfusion. This may be needed if you lose a lot of blood.  Making changes to your diet.  Having surgery to remove your spleen.  Follow these instructions at home:  Take over-the-counter and prescription medicines only as told by your health care provider.  Take supplements only as told by your health care provider.  Follow any diet instructions that you were given.  Keep all follow-up visits as told by your health care provider. This is important. Contact a health care provider if:  You develop new bleeding anywhere in the body. Get help right away if:  You are very weak.  You are short of breath.  You have pain in your abdomen or chest.  You are dizzy or feel faint.  You have trouble concentrating.  You have bloody or black, tarry stools.  You vomit repeatedly or you vomit up blood. Summary  Anemia is a condition in which you do not have enough red blood cells or enough of a substance in your red blood cells that carries oxygen (hemoglobin).  Symptoms may occur suddenly or develop slowly.  If your anemia is mild, you may not have symptoms.  This condition is diagnosed with blood tests as well as a medical history and physical exam. Other tests may be needed.  Treatment for this condition depends on the cause of the anemia. This information is not intended to replace advice  given to you by your health care provider. Make sure you discuss any questions you have with your health care provider. Document Released: 10/24/2004 Document Revised: 10/18/2016 Document Reviewed: 10/18/2016 Elsevier Interactive Patient Education  2018 [place]. Postpartum Care After Vaginal Delivery The period of time right after you deliver your newborn is  called the postpartum period. What kind of medical care will I receive?  You may continue to receive fluids and medicines through an IV tube inserted into one of your veins.  If an incision was made near your vagina (episiotomy) or if you had some vaginal tearing during delivery, cold compresses may be placed on your episiotomy or your tear. This helps to reduce pain and swelling.  You may be given a squirt bottle to use when you go to the bathroom. You may use this until you are comfortable wiping as usual. To use the squirt bottle, follow these steps: ? Before you urinate, fill the squirt bottle with warm water. Do not use hot water. ? After you urinate, while you are sitting on the toilet, use the squirt bottle to rinse the area around your urethra and vaginal opening. This rinses away any urine and blood. ? You may do this instead of wiping. As you start healing, you may use the squirt bottle before wiping yourself. Make sure to wipe gently. ? Fill the squirt bottle with clean water every time you use the bathroom.  You will be given sanitary pads to wear. How can I expect to feel?  You may not feel the need to urinate for several hours after delivery.  You will have some soreness and pain in your abdomen and vagina.  If you are breastfeeding, you may have uterine contractions every time you breastfeed for up to several weeks postpartum. Uterine contractions help your uterus return to its normal size.  It is normal to have vaginal bleeding (lochia) after delivery. The amount and appearance of lochia is often similar to a menstrual period in the first week after delivery. It will gradually decrease over the next few weeks to a dry, yellow-brown discharge. For most women, lochia stops completely by 6-8 weeks after delivery. Vaginal bleeding can vary from woman to woman.  Within the first few days after delivery, you may have breast engorgement. This is when your breasts feel heavy, full,  and uncomfortable. Your breasts may also throb and feel hard, tightly stretched, warm, and tender. After this occurs, you may have milk leaking from your breasts.Your health care provider can help you relieve discomfort due to breast engorgement. Breast engorgement should go away within a few days.  You may feel more sad or worried than normal due to hormonal changes after delivery. These feelings should not last more than a few days. If these feelings do not go away after several days, speak with your health care provider. How should I care for myself?  Tell your health care provider if you have pain or discomfort.  Drink enough water to keep your urine clear or pale yellow.  Wash your hands thoroughly with soap and water for at least 20 seconds after changing your sanitary pads, after using the toilet, and before holding or feeding your baby.  If you are not breastfeeding, avoid touching your breasts a lot. Doing this can make your breasts produce more milk.  If you become weak or lightheaded, or you feel like you might faint, ask for help before: ? Getting out of bed. ? Showering.  Change  your sanitary pads frequently. Watch for any changes in your flow, such as a sudden increase in volume, a change in color, the passing of large blood clots. If you pass a blood clot from your vagina, save it to show to your health care provider. Do not flush blood clots down the toilet without having your health care provider look at them.  Make sure that all your vaccinations are up to date. This can help protect you and your baby from getting certain diseases. You may need to have immunizations done before you leave the hospital.  If desired, talk with your health care provider about methods of family planning or birth control (contraception). How can I start bonding with my baby? Spending as much time as possible with your baby is very important. During this time, you and your baby can get to know each  other and develop a bond. Having your baby stay with you in your room (rooming in) can give you time to get to know your baby. Rooming in can also help you become comfortable caring for your baby. Breastfeeding can also help you bond with your baby. How can I plan for returning home with my baby?  Make sure that you have a car seat installed in your vehicle. ? Your car seat should be checked by a certified car seat installer to make sure that it is installed safely. ? Make sure that your baby fits into the car seat safely.  Ask your health care provider any questions you have about caring for yourself or your baby. Make sure that you are able to contact your health care provider with any questions after leaving the hospital. This information is not intended to replace advice given to you by your health care provider. Make sure you discuss any questions you have with your health care provider. Document Released: 07/14/2007 Document Revised: 02/19/2016 Document Reviewed: 08/21/2015 Elsevier Interactive Patient Education  2018 [employer]. Postpartum Depression and Baby Blues The postpartum period begins right after the birth of a baby. During this time, there is often a great amount of joy and excitement. It is also a time of many changes in the life of the parents. Regardless of how many times a mother gives birth, each child brings new challenges and dynamics to the family. It is not unusual to have feelings of excitement along with confusing shifts in moods, emotions, and thoughts. All mothers are at risk of developing postpartum depression or the "baby blues." These mood changes can occur right after giving birth, or they may occur many months after giving birth. The baby blues or postpartum depression can be mild or severe. Additionally, postpartum depression can go away rather quickly, or it can be a long-term condition. What are the causes? Raised hormone levels and the rapid drop in those  levels are thought to be a main cause of postpartum depression and the baby blues. A number of hormones change during and after pregnancy. Estrogen and progesterone usually decrease right after the delivery of your baby. The levels of thyroid hormone and various cortisol steroids also rapidly drop. Other factors that play a role in these mood changes include major life events and genetics. What increases the risk? If you have any of the following risks for the baby blues or postpartum depression, know what symptoms to watch out for during the postpartum period. Risk factors that may increase the likelihood of getting the baby blues or postpartum depression include:  Having a personal or  family history of depression.  Having depression while being pregnant.  Having premenstrual mood issues or mood issues related to oral contraceptives.  Having a lot of life stress.  Having marital conflict.  Lacking a social support network.  Having a baby with special needs.  Having health problems, such as diabetes.  What are the signs or symptoms? Symptoms of baby blues include:  Brief changes in mood, such as going from extreme happiness to sadness.  Decreased concentration.  Difficulty sleeping.  Crying spells, tearfulness.  Irritability.  Anxiety.  Symptoms of postpartum depression typically begin within the first month after giving birth. These symptoms include:  Difficulty sleeping or excessive sleepiness.  Marked weight loss.  Agitation.  Feelings of worthlessness.  Lack of interest in activity or food.  Postpartum psychosis is a very serious condition and can be dangerous. Fortunately, it is rare. Displaying any of the following symptoms is cause for immediate medical attention. Symptoms of postpartum psychosis include:  Hallucinations and delusions.  Bizarre or disorganized behavior.  Confusion or disorientation.  How is this diagnosed? A diagnosis is made by an  evaluation of your symptoms. There are no medical or lab tests that lead to a diagnosis, but there are various questionnaires that a health care provider may use to identify those with the baby blues, postpartum depression, or psychosis. Often, a screening tool called the [place] Postnatal Depression Scale is used to diagnose depression in the postpartum period. How is this treated? The baby blues usually goes away on its own in 1-2 weeks. Social support is often all that is needed. You will be encouraged to get adequate sleep and rest. Occasionally, you may be given medicines to help you sleep. Postpartum depression requires treatment because it can last several months or longer if it is not treated. Treatment may include individual or group therapy, medicine, or both to address any social, physiological, and psychological factors that may play a role in the depression. Regular exercise, a healthy diet, rest, and social support may also be strongly recommended. Postpartum psychosis is more serious and needs treatment right away. Hospitalization is often needed. Follow these instructions at home:  Get as much rest as you can. Nap when the baby sleeps.  Exercise regularly. Some women find yoga and walking to be beneficial.  Eat a balanced and nourishing diet.  Do little things that you enjoy. Have a cup of tea, take a bubble bath, read your favorite magazine, or listen to your favorite music.  Avoid alcohol.  Ask for help with household chores, cooking, grocery shopping, or running errands as needed. Do not try to do everything.  Talk to people close to you about how you are feeling. Get support from your partner, family members, friends, or other new moms.  Try to stay positive in how you think. Think about the things you are grateful for.  Do not spend a lot of time alone.  Only take over-the-counter or prescription medicine as directed by your health care provider.  Keep all your  postpartum appointments.  Let your health care provider know if you have any concerns. Contact a health care provider if: You are having a reaction to or problems with your medicine. Get help right away if:  You have suicidal feelings.  You think you may harm the baby or someone else. This information is not intended to replace advice given to you by your health care provider. Make sure you discuss any questions you have with your health care  provider. Document Released: 06/20/2004 Document Revised: 02/22/2016 Document Reviewed: 06/28/2013 Elsevier Interactive Patient Education  2017 [place] When a woman feels excessive tension or worry (anxiety) during pregnancy or during the first 12 months after she gives birth, she has a condition called perinatal anxiety. Anxiety can interfere with work, school, relationships, and other everyday activities. If it is not managed properly, it can also cause problems in the mother and her baby.  If you are pregnant and you have symptoms of an anxiety disorder, it is important to talk with your health care provider. What are the causes? The exact cause of this condition is not known. Hormonal changes during and after pregnancy may play a role in causing perinatal anxiety. What increases the risk? You are more likely to develop this condition if:  You have a personal or family history of depression, anxiety, or mood disorders.  You experience a stressful life event during pregnancy, such as the death of a loved one.  You have a lot of regular life stress, such as being a single parent.  You have thyroid problems.  What are the signs or symptoms? Perinatal anxiety can be different for everyone. It may include:  Panic attacks (panic disorder). These are intense episodes of fear or discomfort that may also cause sweating, nausea, shortness of breath, or fear of dying. They usually last 5-15 minutes.  Reliving an upsetting  (traumatic) event through distressing thoughts, dreams, or flashbacks (post-traumatic stress disorder, or PTSD).  Excessive worry about multiple problems (generalized anxiety disorder).  Fear and stress about leaving certain people or loved ones (separation anxiety).  Performing repetitive tasks (compulsions) to relieve stress or worry (obsessive compulsive disorder, or OCD).  Fear of certain objects or situations (phobias).  Excessive worrying, such as a constant feeling that something bad is going to happen.  Inability to relax.  Difficulty concentrating.  Sleep problems.  Frequent nightmares or disturbing thoughts.  How is this diagnosed? This condition is diagnosed based on a physical exam and mental evaluation. In some cases, your health care provider may use an anxiety screening tool. These tools include a list of questions that can help a health care provider diagnose anxiety. Your health care provider may refer you to a mental health expert who specializes in anxiety. How is this treated? This condition may be treated with:  Medicines. Your health care provider will only give you medicines that have been proven safe for pregnancy and breastfeeding.  Talk therapy with a mental health professional to help change your patterns of thinking (cognitive behavioral therapy).  Mindfulness-based stress reduction.  Other relaxation therapies, such as deep breathing or guided muscle relaxation.  Support groups.  Follow these instructions at home: Lifestyle  Do not use any products that contain nicotine or tobacco, such as cigarettes and e-cigarettes. If you need help quitting, ask your health care provider.  Do not use alcohol when you are pregnant. After your baby is born, limit alcohol intake to no more than 1 drink a day. One drink equals 12 oz of beer, 5 oz of wine, or 1 oz of hard liquor.  Consider joining a support group for new mothers. Ask your health care provider for  recommendations.  Take good care of yourself. Make sure you: ? Get plenty of sleep. If you are having trouble sleeping, talk with your health care provider. ? Eat a healthy diet. This includes plenty of fruits and vegetables, whole grains, and lean proteins. ? Exercise regularly, as  told by your health care provider. Ask your health care provider what exercises are safe for you. General instructions  Take over-the-counter and prescription medicines only as told by your health care provider.  Talk with your partner or family members about your feelings during pregnancy. Share any concerns or fears that you may have.  Ask for help with tasks or chores when you need it. Ask friends and family members to provide meals, watch your children, or help with cleaning.  Keep all follow-up visits as told by your health care provider. This is important. Contact a health care provider if:  You (or people close to you) notice that you have any symptoms of anxiety or depression.  You have anxiety and your symptoms get worse.  You experience side effects from medicines, such as nausea or sleep problems. Get help right away if:  You feel like hurting yourself, your baby, or someone else. If you ever feel like you may hurt yourself or others, or have thoughts about taking your own life, get help right away. You can go to your nearest emergency department or call:  Your local emergency services (911 in the [place]  A suicide crisis helpline, such as the [facility] at 340-365-2203. This is open 24 hours a day.  Summary  Perinatal anxiety is when a woman feels excessive tension or worry during pregnancy or during the first 12 months after she gives birth.  Perinatal anxiety may include panic attacks, post-traumatic stress disorder, separation anxiety, phobias, or generalized anxiety.  Perinatal anxiety can cause physical health problems in the mother and baby if not properly  managed.  This condition is treated with medicines, talk therapy, stress reduction therapies, or a combination of two or more treatments.  Talk with your partner or family members about your concerns or fears. Do not be afraid to ask for help. This information is not intended to replace advice given to you by your health care provider. Make sure you discuss any questions you have with your health care provider. Document Released: 11/13/2016 Document Revised: 11/13/2016 Document Reviewed: 11/13/2016 Elsevier Interactive Patient Education  [employer].

## 2018-08-09 NOTE — Discharge Summary (Signed)
OB Discharge Summary     Patient Name: Candice White DOB: [birth year] MRN: [id number]  Date of admission: 08/07/2018 Delivering MD: Morrell Riddle A   Date of discharge: 08/09/2018  Admitting diagnosis: 39wks, ctx 5-21min since 5:45am, pinkish discharge Intrauterine pregnancy: [redacted]w[redacted]d     Secondary diagnosis:  Principal Problem:   SVD (spontaneous vaginal delivery) Active Problems:   Depression   Generalized anxiety disorder   Anemia   Prolonged spontaneous rupture of membranes   [redacted] weeks gestation of pregnancy   Spontaneous vaginal delivery  Additional problems: N/A     Discharge diagnosis: Term Pregnancy Delivered and Anemia                                                                                                Post partum procedures:NONE  Augmentation: Pitocin  Complications: None  Hospital course:  Onset of Labor With Vaginal Delivery     27 y.o. yo G2P1001 at [redacted]w[redacted]d was admitted in Latent Labor on 08/07/2018. Patient had an uncomplicated labor course as follows:  Membrane Rupture Time/Date: 4:00 AM ,08/07/2018   Intrapartum Procedures: Episiotomy: None [1]                                         Lacerations:  None [1]  Patient had a delivery of a Viable infant. 08/08/2018  Information for the patient's newborn:  Dayanis, Bergquist [[id number]]  Delivery Method: Vag-Spont    Pateint had an uncomplicated postpartum course.  She is ambulating, tolerating a regular diet, passing flatus, and urinating well. Patient is discharged home in stable condition on 08/09/18.   Physical exam  Vitals:   08/08/18 1810 08/08/18 2215 08/09/18 0231 08/09/18 0439  BP: 105/63 105/68 111/62 94/63  Pulse: (!) 102 81 82 70  Resp: 18 18 18 18   Temp: 98 F (36.7 C) 97.9 F (36.6 C) 98 F (36.7 C) 98 F (36.7 C)  TempSrc: Oral Oral Oral Oral  SpO2:  99% 100% 100%  Weight:       General: alert, cooperative and no distress Lochia: appropriate Uterine Fundus: firm Incision:  N/A DVT Evaluation: No evidence of DVT seen on physical exam. Negative Homan's sign. No significant calf/ankle edema. Labs: Lab Results  Component Value Date   WBC 14.5 (H) 08/09/2018   HGB 9.1 (L) 08/09/2018   HCT 27.9 (L) 08/09/2018   MCV 87.2 08/09/2018   PLT 237 08/09/2018   CMP Latest Ref Rng & Units 01/05/2013  Glucose 70 - 99 mg/dL 80  BUN 6 - 23 mg/dL 13  Creatinine 0.86 - 5.78 mg/dL 4.69  Sodium 629 - 528 mEq/L 138  Potassium 3.5 - 5.3 mEq/L 3.9  Chloride 96 - 112 mEq/L 107  CO2 19 - 32 mEq/L 24  Calcium 8.4 - 10.5 mg/dL 41.3    Discharge instruction: per After Visit Summary and "Baby and Me Booklet".  After visit meds:  Allergies as of 08/09/2018   No Known Allergies     Medication List  STOP taking these medications   miconazole 2 % vaginal cream Commonly known as:  MONISTAT 7     TAKE these medications   acetaminophen 325 MG tablet Commonly known as:  TYLENOL Take 2 tablets (650 mg total) by mouth every 4 (four) hours as needed (for pain scale < 4ORtemperature>/=100.5 F).   docusate sodium 100 MG capsule Commonly known as:  COLACE Take 1 capsule (100 mg total) by mouth daily as needed.   ferrous sulfate 325 (65 FE) MG tablet Take 1 tablet (325 mg total) by mouth daily.   ibuprofen 600 MG tablet Commonly known as:  ADVIL,MOTRIN Take 1 tablet (600 mg total) by mouth every 6 (six) hours.   PRENATAL COMPLETE 14-0.4 MG Tabs Take 1 tablet by mouth daily.       Diet: routine diet  Activity: Advance as tolerated. Pelvic rest for 6 weeks.   Outpatient follow up:2 weeks FOR MOOD CHECK DUE TO H/O ANXIETY AND DEPRESSION Follow up Appt:No future appointments. Follow up Visit:No follow-ups on file.  Postpartum contraception: None  Newborn Data: Live born female  Birth Weight: 8 lb (3629 g) APGAR: 8, 9  Newborn Delivery   Birth date/time:  08/08/2018 14:51:00 Delivery type:  Vaginal, Spontaneous     Baby Feeding:  Breast Disposition:home with mother   08/09/2018 Altamese Cabal, CNM

## 2018-08-10 ENCOUNTER — Ambulatory Visit: Payer: Self-pay

## 2018-08-10 NOTE — Lactation Note (Signed)
This note was copied from a baby's chart. Lactation Consultation Note Baby 35 hrs old. Mom states BF good. Discussed tips on keeping baby latched deep. Suggested support to keep baby close to mom during feeding. Baby sleeping. Discussed engorgement, I&O, breast care, supply and demand. Encouraged to call for further assistance before d/c home if needed. Mom stated she thought they were doing well.  Patient Name: Candice White [id number] Date: 08/10/2018 Reason for consult: Follow-up assessment   Maternal Data    Feeding Feeding Type: Breast Fed  LATCH Score Latch: Repeated attempts needed to sustain latch, nipple held in mouth throughout feeding, stimulation needed to elicit sucking reflex.  Audible Swallowing: A few with stimulation  Type of Nipple: Everted at rest and after stimulation  Comfort (Breast/Nipple): Soft / non-tender  Hold (Positioning): Assistance needed to correctly position infant at breast and maintain latch.  LATCH Score: 7  Interventions Interventions: Breast feeding basics reviewed  Lactation Tools Discussed/Used     Consult Status Consult Status: Complete Date: 08/10/18    Charyl Dancer 08/10/2018, 2:40 AM

## 2018-11-05 ENCOUNTER — Encounter (HOSPITAL_COMMUNITY): Payer: Self-pay

## 2019-04-12 ENCOUNTER — Telehealth: Payer: Medicaid Other

## 2019-08-18 ENCOUNTER — Encounter: Payer: Medicaid Other | Admitting: Family Medicine

## 2019-09-01 ENCOUNTER — Ambulatory Visit (INDEPENDENT_AMBULATORY_CARE_PROVIDER_SITE_OTHER): Payer: Medicaid Other | Admitting: Family Medicine

## 2019-09-01 ENCOUNTER — Telehealth: Payer: Self-pay | Admitting: Psychology

## 2019-09-01 ENCOUNTER — Encounter: Payer: Self-pay | Admitting: Family Medicine

## 2019-09-01 ENCOUNTER — Other Ambulatory Visit: Payer: Self-pay

## 2019-09-01 VITALS — BP 100/58 | HR 64 | Wt 108.1 lb

## 2019-09-01 DIAGNOSIS — F320 Major depressive disorder, single episode, mild: Secondary | ICD-10-CM | POA: Diagnosis not present

## 2019-09-01 DIAGNOSIS — D649 Anemia, unspecified: Secondary | ICD-10-CM | POA: Diagnosis not present

## 2019-09-01 MED ORDER — SERTRALINE HCL 50 MG PO TABS: 50.0000 mg | ORAL_TABLET | Freq: Every day | ORAL | 3 refills | Status: DC

## 2019-09-01 NOTE — Progress Notes (Addendum)
   Subjective:    Patient ID: Candice White, female    DOB: [birth year], 28 y.o.   MRN: [id number]   CC: Increased anxiety/depression  HPI:  Patient is a very pleasant 28 year old female that presents today for general follow-up and discuss her anxiety/depression symptoms.  Patient states that she had a period of anxiety and depression after the birth of her son 4 years ago.  She states that she has always dealt with some level of anxiety/depression but recently since the birth of her 52-year-old son and the initiation of the COVID-19 pandemic she has been under "more stress".  She works at [employer] currently.  She states her anxiety/depression have been bad enough at times where she has missed work. She states that she does not have any when she can confide in as she is a single mother who works full-time job and has 2 children.  She states she does have family close to her in the area but they "just brushed it off" when she is trying to talk with them and feel that she is "just looking for attention".  Patient says that this makes her feel more alone and that she never has time to do things that she enjoys because she is always either working or caring for her children.  Patient denies any suicidal ideation or homicidal ideation.  She has tried marijuana to help with her anxiety/depression but does not smoke often.  She states that she used to "hit a blunt once or twice per day" but has not smoked in over a week.   ROS: pertinent noted in the HPI   Pertinent PMH, PSH, FH, SoHx: Past medical history significant for anemia.  Lives at home with 11-year-old and 76-year-old children  Smoking status -smokes marijuana occasionally  Objective:  BP (!) 100/58   Pulse 64   Wt 108 lb 2 oz (49 kg)   LMP 08/23/2019   SpO2 99%   BMI 19.15 kg/m   Vitals and nursing note reviewed  PHQ-9 score: 14  General: Pleasant but tearful, able to participate in exam Cardiac: RRR, no murmurs. Respiratory:  CTAB, normal effort Extremities: no edema or cyanosis.    Assessment & Plan:    Depression Plan: -Discussed importance of developing coping mechanisms -Will attempt to schedule patient for counseling sessions to develop coping mechanisms to deal with her anxiety/depression -We will initiate Zoloft 50 mg/day -We will check CBC to look for signs of worsening anemia -Plan for follow-up in 1 month if patient not improving, or sooner if needed.   Lurline Del, [facility] PGY-1

## 2019-09-01 NOTE — Telephone Encounter (Signed)
Schedule virtual visit with Dr. Hartford Poli for 12/10 at 930AM

## 2019-09-01 NOTE — Patient Instructions (Addendum)
It was great to see you!  Our plans for today:  - We discussed anxiety and depression today - I am going to try to set you up for an appointment with Dr. Hartford Poli to discuss coping mechanisms - I am prescribing Zoloft to help with your anxiety and depression - I would like for you to let us know if you are not experiencing a benefit in 1 month. If you are experiencing a benefit please follow up with [place] in 3 months for a follow up appointment.  - We are checking a CBC today with your history of anemia  Take care and seek immediate care sooner if you develop any concerns.   Dr. [facility]

## 2019-09-01 NOTE — Assessment & Plan Note (Signed)
Plan: -Discussed importance of developing coping mechanisms -Will attempt to schedule patient for counseling sessions to develop coping mechanisms to deal with her anxiety/depression -We will initiate Zoloft 50 mg/day -We will check CBC to look for signs of worsening anemia -Plan for follow-up in 1 month if patient not improving, or sooner if needed.

## 2019-09-02 LAB — CBC
Hematocrit: 36.8 % (ref 34.0–46.6)
Hemoglobin: 12.2 g/dL (ref 11.1–15.9)
MCH: 30.2 pg (ref 26.6–33.0)
MCHC: 33.2 g/dL (ref 31.5–35.7)
MCV: 91 fL (ref 79–97)
Platelets: 342 10*3/uL (ref 150–450)
RBC: 4.04 x10E6/uL (ref 3.77–5.28)
RDW: 12.2 % (ref 11.7–15.4)
WBC: 6.2 10*3/uL (ref 3.4–10.8)

## 2019-09-07 ENCOUNTER — Ambulatory Visit: Payer: Medicaid Other | Admitting: Family Medicine

## 2019-09-09 ENCOUNTER — Telehealth: Payer: Medicaid Other | Admitting: Psychology

## 2019-09-09 ENCOUNTER — Other Ambulatory Visit: Payer: Self-pay

## 2019-09-17 ENCOUNTER — Ambulatory Visit: Payer: Medicaid Other | Admitting: Family Medicine

## 2019-10-19 ENCOUNTER — Ambulatory Visit: Payer: Medicaid Other | Admitting: Family Medicine

## 2019-11-17 ENCOUNTER — Other Ambulatory Visit (HOSPITAL_COMMUNITY)
Admission: RE | Admit: 2019-11-17 | Discharge: 2019-11-17 | Disposition: A | Discharge status: Home / Self Care | Payer: Medicaid Other | Source: Ambulatory Visit | Attending: Family Medicine | Admitting: Family Medicine

## 2019-11-17 ENCOUNTER — Other Ambulatory Visit: Payer: Self-pay

## 2019-11-17 ENCOUNTER — Encounter: Payer: Self-pay | Admitting: Family Medicine

## 2019-11-17 ENCOUNTER — Ambulatory Visit (INDEPENDENT_AMBULATORY_CARE_PROVIDER_SITE_OTHER): Payer: Medicaid Other | Admitting: Family Medicine

## 2019-11-17 VITALS — BP 94/62 | HR 69 | Wt 114.0 lb

## 2019-11-17 DIAGNOSIS — Z3200 Encounter for pregnancy test, result unknown: Secondary | ICD-10-CM

## 2019-11-17 DIAGNOSIS — N898 Other specified noninflammatory disorders of vagina: Secondary | ICD-10-CM

## 2019-11-17 LAB — POCT WET PREP (WET MOUNT)
Clue Cells Wet Prep Whiff POC: NEGATIVE
Trichomonas Wet Prep HPF POC: ABSENT

## 2019-11-17 LAB — POCT URINE PREGNANCY: Preg Test, Ur: NEGATIVE

## 2019-11-17 NOTE — Assessment & Plan Note (Addendum)
Wet prep showed no sign of BV, candidiasis, trichomonas.  Some suspicion for trichomonas based on physical exam.  Will verify with send out sample.  Pregnancy test negative. -Follow-up gonorrhea/chlamydia, trichomonas  HIV, RPR not obtained because lab was closed by the time our encounter was finished.  Will consider obtaining blood test at next clinic visit.

## 2019-11-17 NOTE — Progress Notes (Signed)
   CHIEF COMPLAINT / HPI:  Vaginal discharge Ms. Ordaz has noticed vaginal discharge for about 2 weeks.  She is only noticed a little bit of whitish/yellowish discharge on her underwear.  She has not noticed any malodor or itching.  She denies stomach pain, nausea, vomiting, fever.  She is currently sexually active and would like to be tested for STIs.  PERTINENT  PMH / PSH: Previous PID   OBJECTIVE: BP 94/62   Pulse 69   Wt 114 lb (51.7 kg)   LMP 11/10/2019   SpO2 99%   BMI 20.19 kg/m    General: Well-appearing 29 year old woman.  No acute distress Pelvic: Normal-appearing external genitalia and vaginal mucosa.  Yellow discharge, without malodor.  Samples taken.  No chandelier sign.  No evidence of adnexal tenderness or masses.  ASSESSMENT / PLAN:  Vaginal discharge Wet prep showed no sign of BV, candidiasis, trichomonas.  Some suspicion for trichomonas based on physical exam.  Will verify with send out sample.  Pregnancy test negative. -Follow-up gonorrhea/chlamydia, trichomonas  HIV, RPR not obtained because lab was closed by the time our encounter was finished.  Will consider obtaining blood test at next clinic visit.     Mirian Mo, MD [facility]

## 2019-11-22 LAB — CERVICOVAGINAL ANCILLARY ONLY
Chlamydia: NEGATIVE
Comment: NEGATIVE
Comment: NEGATIVE
Comment: NORMAL
Neisseria Gonorrhea: NEGATIVE
Trichomonas: NEGATIVE

## 2020-08-30 ENCOUNTER — Other Ambulatory Visit (HOSPITAL_COMMUNITY)
Admission: RE | Admit: 2020-08-30 | Discharge: 2020-08-30 | Disposition: A | Discharge status: Home / Self Care | Payer: Medicaid Other | Source: Ambulatory Visit | Attending: Family Medicine | Admitting: Family Medicine

## 2020-08-30 ENCOUNTER — Other Ambulatory Visit: Payer: Self-pay

## 2020-08-30 ENCOUNTER — Ambulatory Visit (INDEPENDENT_AMBULATORY_CARE_PROVIDER_SITE_OTHER): Payer: Medicaid Other | Admitting: Family Medicine

## 2020-08-30 VITALS — BP 100/62 | HR 73 | Wt 108.0 lb

## 2020-08-30 DIAGNOSIS — Z1159 Encounter for screening for other viral diseases: Secondary | ICD-10-CM | POA: Diagnosis not present

## 2020-08-30 DIAGNOSIS — Z113 Encounter for screening for infections with a predominantly sexual mode of transmission: Secondary | ICD-10-CM

## 2020-08-30 DIAGNOSIS — Z124 Encounter for screening for malignant neoplasm of cervix: Secondary | ICD-10-CM

## 2020-08-30 DIAGNOSIS — F320 Major depressive disorder, single episode, mild: Secondary | ICD-10-CM

## 2020-08-30 DIAGNOSIS — Z3200 Encounter for pregnancy test, result unknown: Secondary | ICD-10-CM

## 2020-08-30 DIAGNOSIS — N898 Other specified noninflammatory disorders of vagina: Secondary | ICD-10-CM

## 2020-08-30 LAB — POCT WET PREP (WET MOUNT)
Clue Cells Wet Prep Whiff POC: NEGATIVE
Trichomonas Wet Prep HPF POC: ABSENT

## 2020-08-30 LAB — POCT URINE PREGNANCY: Preg Test, Ur: NEGATIVE

## 2020-08-30 NOTE — Progress Notes (Signed)
    SUBJECTIVE:   CHIEF COMPLAINT / HPI:   Vaginal discharge Mom reports that she has been having some mild vaginal discharge for about 2 weeks.  She is sexually active.  The discharge seems to have a brownish tinge like the beginning or end of a menstrual period.  She is not having any significant burning or vaginal itching.  She denies abdominal pain, nausea, fevers.  Although she describes her vaginal discharge as being similar to menstrual bleeding, she is concerned that her menstrual period is late this month.  She is not currently taking any form of birth control.  She is not actively trying to get pregnant but is okay if she does become pregnant.  Mood disorder She scored a 17 on her PHQ-9 today.  She denies SI/HI.  She reports that she has been having a bit more stress in her life especially in the past several days.  She has had issues with depression in the past but has never been given medicine for this.  She is not interested in medicine today but she is interested in connecting with a therapist.  PERTINENT  PMH / PSH: Sexually active  OBJECTIVE:   BP 100/62   Pulse 73   Wt 108 lb (49 kg)   SpO2 99%   BMI 19.13 kg/m    General: Alert and cooperative and appears to be in no acute distress Respiratory: Breathing comfortably on room air.  No respiratory distress.  Abdomen: Bowel sounds normal. Abdomen soft and non-tender. Pelvic: Normal-appearing external genitalia.  Mild leukorrhea.  Moist vaginal mucosa.  Samples taken.  Cervix visualized.  Pap smear performed. Extremities: No peripheral edema. Warm/ well perfused.    ASSESSMENT/PLAN:   Vaginal discharge Wet prep showed no evidence of BV, candidiasis or trichomonas today.  No medication provided at the visit today.  Pregnancy test negative. -Follow-up Pap smear -Follow-up GC/chlamydia -Follow-up HIV, RPR  Depression Not interested in any medicine today.  She is interested in establishing care with a  therapist. -Therapy resources provided.     Mirian Mo, MD [facility]

## 2020-08-30 NOTE — Assessment & Plan Note (Addendum)
Wet prep showed no evidence of BV, candidiasis or trichomonas today.  No medication provided at the visit today.  Pregnancy test negative. -Follow-up Pap smear -Follow-up GC/chlamydia -Follow-up HIV, RPR

## 2020-08-30 NOTE — Assessment & Plan Note (Signed)
Not interested in any medicine today.  She is interested in establishing care with a therapist. -Therapy resources provided.

## 2020-08-30 NOTE — Patient Instructions (Addendum)
Therapy and Counseling Resources Most providers on this list will take Medicaid. Patients with commercial insurance or Medicare should contact their insurance company to get a list of in network providers.  BestDay:Psychiatry and Counseling [address] [facility] [place]. [place], [place] [address] [place]  [address], [place], [place] [address]      [place] [address]  [place], [place] [address] [phone]  [place] [address] [place]  [place], [place] [address]       [phone]      Jinny Blossom [facility] [address] [place], [place]  Family Solutions:  [place]. [place] [phone]  Journeys Counseling:  [place] [address] Fate [phone]  [facility] (under & uninsured) [address], [place] [phone]    [email or web address]    [place] [address] . [place]    [phone]  [facility] [place]     Phone:  [phone]     [place]  [place] [address]      [place], [place] ext [place]: [place], [place], [place]   [place] ([place] therapist) [email or web address]  [place] [address]   [place] [address]    [phone]    [facility]   [address],  [place], [place]   [place]  [place]  [facility]  [address] [place], [place]        [phone]  Open Access/Walk In Clinic under & uninsured  [facility]  [address] [place], [place] Crisis  [phone]  Family Service of the [place],  ([place])   [place], [place]: [phone]) 8:30 - 12; 1 - 2:30  Family Service of the [place],  [place], [place]    [phone]):8:30 - 12; 2 - 3PM  [facility] [employer],  [address],  [place]; [phone]):   Mon - Fri 8 AM - 5 PM  Al[facility] Drug Services [place]  MWF 12:30 to 3:00 or call to schedule an appointment  [phone]  Specific Provider options Psychology Today  [email or web address].[email or web address]/us 1. click on find a therapist  2. enter your zip code 3. left side and select or tailor a therapist for your specific need.   [facility] Provider Directory [email or web address]  (Medicaid)   Follow all drop down to find a provider  [place] [phone] or [email or web address] [address], [place], [place] Recovery support and educational   24- Hour Availability:  .  [place] [facility]  . [place], [place] Crisis [phone]  . Family Service of the [employer] [phone]  [facility] Crisis Service  [phone]   . [place]  [phone] (after hours)  . Therapeutic Alternative/Mobile Crisis   [phone]  . [place] National Suicide Hotline  7126802636 ([place])  . Call 911 or go to emergency room  . [employer]  [phone]);  [place] and [employer]   . [facility] ACCESS  [phone]); [place], [place], [place], [place], [place], [place], [place] Sex Practicing safe sex means taking steps before and during sex to reduce your risk of:  Getting an STI (sexually transmitted infection).  Giving your partner an STI.  Unwanted or unplanned pregnancy. How can I practice safe sex?     Ways you can practice safe sex  Limit your sexual partners to  only one partner who is having sex with only you.  Avoid using alcohol and drugs before having sex. Alcohol and drugs can affect your judgment.  Before having sex with a new partner: ? Talk to your partner about past partners, past STIs, and drug use. ? Get screened for STIs and discuss the results with your partner. Ask your partner to get screened, too.  Check your body regularly for sores, blisters, rashes, or unusual discharge. If you notice any of these problems, visit your health care provider.  Avoid sexual contact if you have symptoms of an infection or you are being treated for an STI.  While having sex, use a condom. Make sure to: ? Use a condom every time you have vaginal, oral, or anal sex. Both females and males should wear condoms during oral sex. ? Keep condoms in place from the beginning to the end of sexual activity. ? Use a latex condom, if possible. Latex condoms offer the best protection. ? Use only water-based lubricants with a condom. Using petroleum-based lubricants or oils will weaken the condom and increase the chance that it will break. Ways your health care provider can help you practice safe sex  See your health care provider for regular screenings, exams, and tests for STIs.  Talk with your health care provider about what kind of birth control (contraception) is best for you.  Get vaccinated against hepatitis B and human papillomavirus (HPV).  If you are at risk of being infected with HIV (human immunodeficiency virus), talk with your health care provider about taking a prescription medicine to prevent HIV infection. You are at risk for HIV if you: ? Are a man who has sex with other men. ? Are sexually active with more than one partner. ? Take drugs by injection. ? Have a sex partner who has HIV. ? Have unprotected sex. ? Have sex with someone who has sex with both men and women. ? Have had an STI. Follow these instructions at home:  Take  over-the-counter and prescription medicines as told by your health care provider.  Keep all follow-up visits as told by your health care provider. This is important. Where to find more information  Centers for Disease Control and Prevention: LessFurniture.be  Planned Parenthood: https://www.plannedparenthood.org/  Office on Women's Health: EmploymentTracking.tn Summary  Practicing safe sex means taking steps before and during sex to reduce your risk of STIs, giving your partner STIs, and having an unwanted or unplanned pregnancy.  Before having sex with a new partner, talk to your partner about past partners, past STIs, and drug use.  Use a condom every time you have vaginal, oral, or anal sex. Both females and males should wear condoms during oral sex.  Check your body regularly for sores, blisters, rashes, or unusual discharge. If you notice any of these problems, visit your health care provider.  See your health care provider for regular screenings, exams, and tests for STIs. This information is not intended to replace advice given to you by your health care provider. Make  sure you discuss any questions you have with your health care provider. Document Revised: 01/08/2019 Document Reviewed: 06/29/2018 Elsevier Patient Education  2020 [employer].

## 2020-08-31 LAB — HCV INTERPRETATION

## 2020-08-31 LAB — HCV AB W REFLEX TO QUANT PCR: HCV Ab: <0.1 s/co ratio (ref 0.0–0.9)

## 2020-08-31 LAB — RPR: RPR Ser Ql: NONREACTIVE

## 2020-08-31 LAB — HIV ANTIBODY (ROUTINE TESTING W REFLEX): HIV Screen 4th Generation wRfx: NONREACTIVE

## 2020-09-04 LAB — CYTOLOGY - PAP
Chlamydia: NEGATIVE
Comment: NEGATIVE
Comment: NORMAL
Diagnosis: NEGATIVE
Neisseria Gonorrhea: NEGATIVE

## 2020-09-04 NOTE — Progress Notes (Signed)
Your Pap smear is totally normal.  You will be due for your next Pap smear in 3 years.

## 2021-02-22 NOTE — Progress Notes (Deleted)
    SUBJECTIVE:   CHIEF COMPLAINT / HPI:   Mouth outbreak.    PERTINENT  PMH / PSH: ***  OBJECTIVE:   There were no vitals taken for this visit.  ***  ASSESSMENT/PLAN:   No problem-specific Assessment & Plan notes found for this encounter.     Sandre Kitty, MD [facility]   {    This will disappear when note is signed, click to select method of visit    :1}

## 2021-02-23 ENCOUNTER — Ambulatory Visit: Payer: Medicaid Other | Admitting: Family Medicine

## 2021-04-06 ENCOUNTER — Other Ambulatory Visit: Payer: Self-pay

## 2021-04-06 ENCOUNTER — Ambulatory Visit (INDEPENDENT_AMBULATORY_CARE_PROVIDER_SITE_OTHER): Payer: Medicaid Other

## 2021-04-06 DIAGNOSIS — Z3201 Encounter for pregnancy test, result positive: Secondary | ICD-10-CM

## 2021-04-06 DIAGNOSIS — Z3200 Encounter for pregnancy test, result unknown: Secondary | ICD-10-CM

## 2021-04-06 LAB — POCT PREGNANCY, URINE: Preg Test, Ur: POSITIVE — AB

## 2021-04-06 NOTE — Progress Notes (Signed)
Possible Pregnancy  Here today for pregnancy confirmation. UPT in office today is positive. Called pt with results at home number; VM left stating I am calling with results. Encouraged pt to call the office on Monday or to check MyChart. Called mobile number; not in service.    MyChart message sent.   Marjo Bicker, RN 04/06/2021  11:18 AM

## 2021-04-06 NOTE — Progress Notes (Signed)
Chart reviewed for nurse visit. Agree with plan of care.   Venora Maples, MD 04/06/21 1:15 PM

## 2021-04-13 ENCOUNTER — Ambulatory Visit (INDEPENDENT_AMBULATORY_CARE_PROVIDER_SITE_OTHER): Payer: Medicaid Other

## 2021-04-13 ENCOUNTER — Other Ambulatory Visit: Payer: Self-pay

## 2021-04-13 VITALS — BP 106/69 | HR 67 | Ht 65.0 in | Wt 104.0 lb

## 2021-04-13 DIAGNOSIS — Z3480 Encounter for supervision of other normal pregnancy, unspecified trimester: Secondary | ICD-10-CM | POA: Insufficient documentation

## 2021-04-13 DIAGNOSIS — O219 Vomiting of pregnancy, unspecified: Secondary | ICD-10-CM

## 2021-04-13 MED ORDER — BLOOD PRESSURE KIT DEVI: 1.0000 | 0 refills | Status: DC

## 2021-04-13 MED ORDER — PROMETHAZINE HCL 25 MG PO TABS: 25.0000 mg | ORAL_TABLET | Freq: Four times a day (QID) | ORAL | 0 refills | Status: DC | PRN

## 2021-04-13 MED ORDER — PRENATAL COMPLETE 14-0.4 MG PO TABS: 1.0000 | ORAL_TABLET | Freq: Every day | ORAL | 12 refills | Status: DC

## 2021-04-13 MED ORDER — GOJJI WEIGHT SCALE MISC: 1.0000 | 0 refills | Status: DC

## 2021-04-13 NOTE — Progress Notes (Signed)
Ms. Placido presents today for UPT. She has no unusual complaints and complains of nausea with vomiting for 7 days.  LMP: 02/16/2021    OBJECTIVE: Appears well, in no apparent distress.  OB History     Gravida  3   Para  1   Term  1   Preterm      AB      Living  1      SAB      IAB      Ectopic      Multiple  0   Live Births  1          Home UPT Result: POSITIVE In-Office UPT result: POSITIVE  I have reviewed the patient's medical, obstetrical, social, and family histories, and medications.   ASSESSMENT: Positive pregnancy test LMP 02/16/2021 EDD 11/23/2021 GA     [redacted]w[redacted]d  PLAN Prenatal care to be completed at: [facility] Babyscripts ordered BP Cuff ordered PNV and Nausea Rx sent

## 2021-04-15 NOTE — Progress Notes (Signed)
Patient was assessed and managed by nursing staff during this encounter. I have reviewed the chart and agree with the documentation and plan. I have also made any necessary editorial changes.  Cherre Robins, CNM 04/15/2021 1:58 AM

## 2021-04-20 ENCOUNTER — Ambulatory Visit: Payer: Medicaid Other

## 2021-04-20 ENCOUNTER — Other Ambulatory Visit: Payer: Self-pay

## 2021-04-20 ENCOUNTER — Ambulatory Visit (INDEPENDENT_AMBULATORY_CARE_PROVIDER_SITE_OTHER): Payer: Medicaid Other

## 2021-04-20 VITALS — BP 99/68 | HR 78 | Ht 63.0 in | Wt 102.0 lb

## 2021-04-20 DIAGNOSIS — Z3A01 Less than 8 weeks gestation of pregnancy: Secondary | ICD-10-CM | POA: Diagnosis not present

## 2021-04-20 DIAGNOSIS — F439 Reaction to severe stress, unspecified: Secondary | ICD-10-CM

## 2021-04-20 DIAGNOSIS — Z3481 Encounter for supervision of other normal pregnancy, first trimester: Secondary | ICD-10-CM | POA: Diagnosis not present

## 2021-04-20 DIAGNOSIS — Z3480 Encounter for supervision of other normal pregnancy, unspecified trimester: Secondary | ICD-10-CM

## 2021-04-20 NOTE — Progress Notes (Signed)
New OB Intake  I connected with  Candice White on 04/20/21 at 10:15 AM EDT by in person. Video Visit and verified that I am speaking with the correct person using two identifiers. Nurse is located at [facility] and pt is located at [place].  I discussed the limitations, risks, security and privacy concerns of performing an evaluation and management service by telephone and the availability of in person appointments. I also discussed with the patient that there may be a patient responsible charge related to this service. The patient expressed understanding and agreed to proceed.  I explained I am completing New OB Intake today. We discussed her EDD of 12/03/21 that is based on early u/s. Pt is G3/P2002. I reviewed her allergies, medications, Medical/Surgical/OB history, and appropriate screenings. I informed her of [facility] services. Based on history, this is a/an  pregnancy uncomplicated .   Patient Active Problem List   Diagnosis Date Noted   Supervision of other normal pregnancy, antepartum 04/13/2021   Vaginal discharge 11/17/2019   Prolonged spontaneous rupture of membranes 08/08/2018   Anemia 06/01/2018   Cervical high risk HPV (human papillomavirus) test positive 02/03/2018   Depression 05/28/2017   Generalized anxiety disorder 05/28/2017   Migraine headache 05/20/2015    Concerns addressed today  Delivery Plans:  [facility].   MyChart/Babyscripts MyChart access verified. I explained pt will have some visits in office and some virtually. Babyscripts instructions given and order placed. Patient verifies receipt of registration text/e-mail. Account successfully created and app downloaded.  Blood Pressure Cuff  Blood pressure cuff ordered for patient to pick-up from [employer]. Explained after first prenatal appt pt will check weekly and document in Babyscripts.  Weight scale: Patient does not have weight scale. Weight scale ordered for patient to pick up form [facility].   Anatomy US Explained first scheduled [place] will be around 19 weeks. Dating and viability scan performed today.  Labs Discussed Avelina Laine genetic screening with patient. Would like both Panorama and Horizon drawn at new OB visit. Routine prenatal labs needed.  Covid Vaccine Patient has not covid vaccine.   Mother/ Baby Dyad Candidate?    If yes, offer as possibility  Informed patient of [facility] website  and placed link in her AVS.   Social Determinants of Health Food Insecurity: Patient denies food insecurity. WIC Referral: Patient is interested in referral to [facility].  Transportation: Patient denies transportation needs. Childcare: Discussed no children allowed at ultrasound appointments. Offered childcare services; patient expresses need for childcare services. Childcare scheduled for appropriate appointments and information given to patient.   Placed OB Box on problem list and updated  First visit review I reviewed new OB appt with pt. I explained she will have a pelvic exam, ob bloodwork with genetic screening, and PAP smear. Explained pt will be seen by Coral Ceo at first visit; encounter routed to appropriate provider. Explained that patient will be seen by pregnancy navigator following visit with provider. [facility] information placed in AVS.   Hamilton Capri, RN 04/20/2021  10:40 AM

## 2021-04-24 ENCOUNTER — Other Ambulatory Visit: Payer: Self-pay

## 2021-04-24 ENCOUNTER — Other Ambulatory Visit: Payer: Self-pay | Admitting: Obstetrics

## 2021-04-24 DIAGNOSIS — Z3480 Encounter for supervision of other normal pregnancy, unspecified trimester: Secondary | ICD-10-CM

## 2021-04-24 MED ORDER — VITAFOL GUMMIES 3.33-0.333-34.8 MG PO CHEW: CHEWABLE_TABLET | ORAL | 11 refills | Status: DC

## 2021-04-24 MED ORDER — GOJJI WEIGHT SCALE MISC: 1.0000 | 0 refills | Status: DC

## 2021-04-24 MED ORDER — BLOOD PRESSURE KIT DEVI: 1.0000 | 0 refills | Status: DC

## 2021-04-24 MED ORDER — DOXYLAMINE-PYRIDOXINE 10-10 MG PO TBEC: 2.0000 | DELAYED_RELEASE_TABLET | Freq: Every day | ORAL | 5 refills | Status: DC

## 2021-05-01 ENCOUNTER — Telehealth: Payer: Self-pay | Admitting: Licensed Clinical Social Worker

## 2021-05-01 ENCOUNTER — Ambulatory Visit: Payer: Medicaid Other | Admitting: Licensed Clinical Social Worker

## 2021-05-01 DIAGNOSIS — Z5329 Procedure and treatment not carried out because of patient's decision for other reasons: Secondary | ICD-10-CM

## 2021-05-01 DIAGNOSIS — Z91199 Patient's noncompliance with other medical treatment and regimen due to unspecified reason: Secondary | ICD-10-CM

## 2021-05-01 NOTE — Telephone Encounter (Signed)
Called pt at [phone] regarding schedule mychart visit. Left general message requesting callback. Mobile ph number listed is not a working ph number.

## 2021-05-03 ENCOUNTER — Encounter: Payer: Self-pay | Admitting: Obstetrics

## 2021-05-07 ENCOUNTER — Other Ambulatory Visit: Payer: Self-pay

## 2021-05-07 DIAGNOSIS — Z3480 Encounter for supervision of other normal pregnancy, unspecified trimester: Secondary | ICD-10-CM

## 2021-05-07 MED ORDER — CONCEPT OB 130-92.4-1 MG PO CAPS: 1.0000 | ORAL_CAPSULE | Freq: Every day | ORAL | 11 refills | Status: DC

## 2021-05-07 NOTE — BH Specialist Note (Signed)
Pt no show

## 2021-05-10 ENCOUNTER — Encounter: Payer: Self-pay | Admitting: Obstetrics

## 2021-05-10 ENCOUNTER — Ambulatory Visit (INDEPENDENT_AMBULATORY_CARE_PROVIDER_SITE_OTHER): Payer: Medicaid Other | Admitting: Obstetrics

## 2021-05-10 ENCOUNTER — Other Ambulatory Visit: Payer: Self-pay

## 2021-05-10 ENCOUNTER — Other Ambulatory Visit (HOSPITAL_COMMUNITY)
Admission: RE | Admit: 2021-05-10 | Discharge: 2021-05-10 | Disposition: A | Discharge status: Home / Self Care | Payer: Medicaid Other | Source: Ambulatory Visit | Attending: Obstetrics | Admitting: Obstetrics

## 2021-05-10 VITALS — Wt 100.3 lb

## 2021-05-10 DIAGNOSIS — Z3A10 10 weeks gestation of pregnancy: Secondary | ICD-10-CM

## 2021-05-10 DIAGNOSIS — Z3480 Encounter for supervision of other normal pregnancy, unspecified trimester: Secondary | ICD-10-CM | POA: Diagnosis not present

## 2021-05-10 LAB — HEPATITIS C ANTIBODY: HCV Ab: NEGATIVE

## 2021-05-10 NOTE — Progress Notes (Signed)
Pt presents for NOB.  Normal pap 08/30/2020 This is not a planned pregnancy, FOB is involved not living together.

## 2021-05-10 NOTE — Progress Notes (Signed)
Subjective:    Candice White is being seen today for her first obstetrical visit.  This is not a planned pregnancy. She is at [redacted]w[redacted]d gestation. Her obstetrical history is significant for  none . Relationship with FOB: significant other, not living together. Patient does intend to breast feed. Pregnancy history fully reviewed.  The information documented in the HPI was reviewed and verified.  Menstrual History: OB History     Gravida  3   Para  2   Term  2   Preterm      AB      Living  2      SAB      IAB      Ectopic      Multiple  0   Live Births  2           Patient's last menstrual period was 02/16/2021 (approximate).    Past Medical History:  Diagnosis Date   BV (bacterial vaginosis)    Migraine    chronic ha    PID (acute pelvic inflammatory disease)    Prolonged spontaneous rupture of membranes 08/08/2018    Past Surgical History:  Procedure Laterality Date   NO PAST SURGERIES      (Not in a hospital admission)  No Known Allergies  Social History   Tobacco Use   Smoking status: Former    Years: 3.00    Types: Cigarettes    Quit date: 12/08/2012    Years since quitting: 8.4   Smokeless tobacco: Former    Quit date: 10/02/2014  Substance Use Topics   Alcohol use: Not Currently    Comment: in past    Family History  Problem Relation Age of Onset   Cancer Maternal Aunt        lung cancer   Cancer Cousin        lung   Hypertension Mother      Review of Systems Constitutional: negative for weight loss Gastrointestinal: negative for vomiting Genitourinary:negative for genital lesions and vaginal discharge and dysuria Musculoskeletal:negative for back pain Behavioral/Psych: negative for abusive relationship, depression, illegal drug usage and tobacco use    Objective:    Wt 100 lb 4.8 oz (45.5 kg)   LMP 02/16/2021 (Approximate)   BMI 17.77 kg/m  General Appearance:    Alert, cooperative, no distress, appears stated age  Head:     Normocephalic, without obvious abnormality, atraumatic  Eyes:    PERRL, conjunctiva/corneas clear, EOM's intact, fundi    benign, both eyes  Ears:    Normal TM's and external ear canals, both ears  Nose:   Nares normal, septum midline, mucosa normal, no drainage    or sinus tenderness  Throat:   Lips, mucosa, and tongue normal; teeth and gums normal  Neck:   Supple, symmetrical, trachea midline, no adenopathy;    thyroid:  no enlargement/tenderness/nodules; no carotid   bruit or JVD  Back:     Symmetric, no curvature, ROM normal, no CVA tenderness  Lungs:     Clear to auscultation bilaterally, respirations unlabored  Chest Wall:    No tenderness or deformity   Heart:    Regular rate and rhythm, S1 and S2 normal, no murmur, rub   or gallop  Breast Exam:    No tenderness, masses, or nipple abnormality  Abdomen:     Soft, non-tender, bowel sounds active all four quadrants,    no masses, no organomegaly  Genitalia:    Normal female without lesion,  discharge or tenderness  Extremities:   Extremities normal, atraumatic, no cyanosis or edema  Pulses:   2+ and symmetric all extremities  Skin:   Skin color, texture, turgor normal, no rashes or lesions  Lymph nodes:   Cervical, supraclavicular, and axillary nodes normal  Neurologic:   CNII-XII intact, normal strength, sensation and reflexes    throughout      Lab Review Urine pregnancy test Labs reviewed yes Radiologic studies reviewed no  Assessment:    Pregnancy at [redacted]w[redacted]d weeks    Plan:    1. Supervision of other normal pregnancy, antepartum Rx: - Genetic Screening - Cervicovaginal ancillary only - Culture, OB Urine - Obstetric Panel, Including HIV - Hepatitis C Antibody   Prenatal vitamins.  Counseling provided regarding continued use of seat belts, cessation of alcohol consumption, smoking or use of illicit drugs; infection precautions i.e., influenza/TDAP immunizations, toxoplasmosis,CMV, parvovirus, listeria and varicella;  workplace safety, exercise during pregnancy; routine dental care, safe medications, sexual activity, hot tubs, saunas, pools, travel, caffeine use, fish and methlymercury, potential toxins, hair treatments, varicose veins Weight gain recommendations per IOM guidelines reviewed: underweight/BMI< 18.5--> gain 28 - 40 lbs; normal weight/BMI 18.5 - 24.9--> gain 25 - 35 lbs; overweight/BMI 25 - 29.9--> gain 15 - 25 lbs; obese/BMI >30->gain  11 - 20 lbs Problem list reviewed and updated. FIRST/CF mutation testing/NIPT/QUAD SCREEN/fragile X/Ashkenazi Jewish population testing/Spinal muscular atrophy discussed: requested. Role of ultrasound in pregnancy discussed; fetal survey: requested. Amniocentesis discussed: not indicated.   Orders Placed This Encounter  Procedures   Culture, OB Urine   Genetic Screening    PANORAMA   Obstetric Panel, Including HIV   Hepatitis C Antibody    Follow up in 4 weeks.  I have spent a total of 20 minutes of face-to-face time, excluding clinical staff time, reviewing notes and preparing to see patient, ordering tests and/or medications, and counseling the patient.    Brock Bad, MD 05/10/2021 1:49 PM

## 2021-05-11 LAB — OBSTETRIC PANEL, INCLUDING HIV
Antibody Screen: NEGATIVE
Basophils Absolute: 0 10*3/uL (ref 0.0–0.2)
Basos: 0 %
EOS (ABSOLUTE): 0.2 10*3/uL (ref 0.0–0.4)
Eos: 2 %
HIV Screen 4th Generation wRfx: NONREACTIVE
Hematocrit: 36.8 % (ref 34.0–46.6)
Hemoglobin: 12.1 g/dL (ref 11.1–15.9)
Hepatitis B Surface Ag: NEGATIVE
Immature Grans (Abs): 0 10*3/uL (ref 0.0–0.1)
Immature Granulocytes: 0 %
Lymphocytes Absolute: 2.1 10*3/uL (ref 0.7–3.1)
Lymphs: 25 %
MCH: 29.4 pg (ref 26.6–33.0)
MCHC: 32.9 g/dL (ref 31.5–35.7)
MCV: 89 fL (ref 79–97)
Monocytes Absolute: 0.9 10*3/uL (ref 0.1–0.9)
Monocytes: 10 %
Neutrophils Absolute: 5.4 10*3/uL (ref 1.4–7.0)
Neutrophils: 63 %
Platelets: 451 10*3/uL — ABNORMAL HIGH (ref 150–450)
RBC: 4.12 x10E6/uL (ref 3.77–5.28)
RDW: 11.8 % (ref 11.7–15.4)
RPR Ser Ql: NONREACTIVE
Rh Factor: POSITIVE
Rubella Antibodies, IGG: 7.48 index (ref >0.99)
WBC: 8.6 10*3/uL (ref 3.4–10.8)

## 2021-05-11 LAB — CERVICOVAGINAL ANCILLARY ONLY
Bacterial Vaginitis (gardnerella): NEGATIVE
Candida Glabrata: NEGATIVE
Candida Vaginitis: NEGATIVE
Chlamydia: NEGATIVE
Comment: NEGATIVE
Comment: NEGATIVE
Comment: NEGATIVE
Comment: NEGATIVE
Comment: NEGATIVE
Comment: NORMAL
Neisseria Gonorrhea: NEGATIVE
Trichomonas: NEGATIVE

## 2021-05-11 LAB — HEPATITIS C ANTIBODY: Hep C Virus Ab: <0.1 s/co ratio (ref 0.0–0.9)

## 2021-05-12 LAB — CULTURE, OB URINE

## 2021-05-12 LAB — URINE CULTURE, OB REFLEX: Organism ID, Bacteria: NO GROWTH

## 2021-05-21 ENCOUNTER — Encounter: Payer: Self-pay | Admitting: Obstetrics

## 2021-05-24 ENCOUNTER — Encounter: Payer: Self-pay | Admitting: Obstetrics

## 2021-06-05 ENCOUNTER — Ambulatory Visit (INDEPENDENT_AMBULATORY_CARE_PROVIDER_SITE_OTHER): Payer: Medicaid Other | Admitting: Women's Health

## 2021-06-05 ENCOUNTER — Other Ambulatory Visit: Payer: Self-pay

## 2021-06-05 VITALS — BP 86/57 | HR 81 | Wt 103.0 lb

## 2021-06-05 DIAGNOSIS — Z3480 Encounter for supervision of other normal pregnancy, unspecified trimester: Secondary | ICD-10-CM

## 2021-06-05 DIAGNOSIS — Z3A14 14 weeks gestation of pregnancy: Secondary | ICD-10-CM

## 2021-06-05 NOTE — Patient Instructions (Addendum)
Maternity Assessment Unit (MAU)  The Maternity Assessment Unit (MAU) is located at the [facility]. The address is: [address], [address], [place], [place] [address]. Please see map below for additional directions.    The Maternity Assessment Unit is designed to help you during your pregnancy, and for up to 6 weeks after delivery, with any pregnancy- or postpartum-related emergencies, if you think you are in labor, or if your water has broken. For example, if you experience nausea and vomiting, vaginal bleeding, severe abdominal or pelvic pain, elevated blood pressure or other problems related to your pregnancy or postpartum time, please come to the Maternity Assessment Unit for assistance.       Preterm Labor The normal length of a pregnancy is 39-41 weeks. Preterm labor is when labor starts before 37 completed weeks of pregnancy. Babies who are born prematurely and survive may not be fully developed and may be at an increased risk for long-term problems such as cerebral palsy, developmental delays, and vision and hearing problems. Babies who are born too early may have problems soon after birth. Premature babies may have problems regulating blood sugar, body temperature, heart rate, and breathing rate. These babies often have trouble with feeding. The risk of having problems is highest for babies who are born before 34 weeks of pregnancy. What are the causes? The exact cause of this condition is not known. What increases the risk? You are more likely to have preterm labor if you have certain risk factors that relate to your medical history, problems with present and past pregnancies, and lifestyle factors. Medical history You have abnormalities of the uterus, including a short cervix. You have STIs (sexually transmitted infections) or other infections of the urinary tract and the vagina. You have chronic illnesses, such as blood clotting  problems, diabetes, or high blood pressure. You are overweight or underweight. Present and past pregnancies You have had preterm labor before. You are pregnant with twins or other multiples. You have been diagnosed with a condition in which the placenta covers your cervix (placenta previa). You waited less than 18 months between giving birth and becoming pregnant again. Your unborn baby has some abnormalities. You have vaginal bleeding during pregnancy. You became pregnant through in vitro fertilization (IVF). Lifestyle and environmental factors You use tobacco products or drink alcohol. You use drugs. You have stress and no social support. You experience domestic violence. You are exposed to certain chemicals or environmental pollutants. Other factors You are younger than age 17 or older than age 35. What are the signs or symptoms? Symptoms of this condition include: Cramps similar to those that can happen during a menstrual period. The cramps may happen with diarrhea. Pain in the abdomen or lower back. Regular contractions that may feel like tightening of the abdomen. A feeling of increased pressure in the pelvis. Increased watery or bloody mucus discharge from the vagina. Water breaking (ruptured amniotic sac). How is this diagnosed? This condition is diagnosed based on: Your medical history and a physical exam. A pelvic exam. An ultrasound. Monitoring your uterus for contractions. Other tests, including: A swab of the cervix to check for a chemical called fetal fibronectin. Urine tests. How is this treated? Treatment for this condition depends on the length of your pregnancy, your condition, and the health of your baby. Treatment may include: Taking medicines, such as: Hormone medicines. These may be given early in pregnancy to help support the pregnancy. Medicines to stop   contractions. Medicines to help mature the baby's lungs. These may be prescribed if the risk of  delivery is high. Medicines to help protect your baby from brain and nerve complications such as cerebral palsy. Bed rest. If the labor happens before 34 weeks of pregnancy, you may need to stay in the hospital. Delivery of the baby. Follow these instructions at home:  Do not use any products that contain nicotine or tobacco. These products include cigarettes, chewing tobacco, and vaping devices, such as e-cigarettes. If you need help quitting, ask your health care provider. Do not drink alcohol. Take over-the-counter and prescription medicines only as told by your health care provider. Rest as told by your health care provider. Return to your normal activities as told by your health care provider. Ask your health care provider what activities are safe for you. Keep all follow-up visits. This is important. How is this prevented? To increase your chance of having a full-term pregnancy: Do not use drugs or take medicines that have not been prescribed to you during your pregnancy. Talk with your health care provider before taking any herbal supplements, even if you have been taking them regularly. Make sure you gain a healthy amount of weight during your pregnancy. Watch for infection. If you think that you might have an infection, get it checked right away. Symptoms of infection may include: Fever. Abnormal vaginal discharge or discharge that smells bad. Pain or burning with urination. Needing to urinate urgently. Frequently urinating or passing small amounts of urine frequently. Blood in your urine or urine that smells bad or unusual. Where to find more information U.S. Department of Health and Cytogeneticist on Women's Health: http://hoffman.com/ [employer]st[employer]ians [employer]necologists: www.acog.org Centers for Disease Control and Prevention, Preterm Birth: FootballExhibition.com.br Contact a health care provider if: You think you are going into preterm labor. You have signs  or symptoms of preterm labor. You have symptoms of infection. Get help right away if: You are having regular, painful contractions every 5 minutes or less. Your water breaks. Summary Preterm labor is labor that starts before you reach 37 weeks of pregnancy. Delivering your baby early increases your baby's risk of developing long-term problems. You are more likely to have preterm labor if you have certain risk factors that relate to your medical history, problems with present and past pregnancies, and lifestyle factors. Keep all follow-up visits. This is important. Contact a health care provider if you have signs or symptoms of preterm labor. This information is not intended to replace advice given to you by your health care provider. Make sure you discuss any questions you have with your health care provider. Document Revised: 09/19/2020 Document Reviewed: 09/19/2020 Elsevier Patient Education  2022 Elsevier Inc.       Childbirth Education Options: [facility] Department Classes:  Childbirth education classes can help you get ready for a positive parenting experience. You can also meet other expectant parents and get free stuff for your baby. Each class runs for five weeks on the same night and costs $45 for the mother-to-be and her support person. Medicaid covers the cost if you are eligible. Call [phone] to register. [facility] Childbirth Education: Classes can vary in availability and schedule is subject to change. For most up-to-date information please visit [email or web address] to review and register.             Alpha-Fetoprotein Test - NEXT VISIT Why am I having this test? The alpha-fetoprotein test is  a lab test most commonly used for pregnant women to help screen for birth defects in their unborn baby. It can be used to screen for chromosome (DNA) abnormalities, problems with the brain or spinal cord, or problems with the abdominal  wall of the unborn baby (fetus). The alpha-fetoprotein test may also be done for men or nonpregnant women to check for certain cancers. What is being tested? This test measures the amount of alpha-fetoprotein (AFP) in your blood. AFP is a protein that is made by the liver. Levels can be detected in the mother's blood during pregnancy, starting at 10 weeks and peaking at 16-18 weeks of the pregnancy. Abnormal levels can sometimes be a sign of a birth defect in the baby. Certain cancers can cause a high level of AFP in men and nonpregnant women. What kind of sample is taken? A blood sample is required for this test. It is usually collected by inserting a needle into a blood vessel. How are the results reported? Your test results will be reported as values. Your health care provider will compare your results to normal ranges that were established after testing a large group of people (reference values). Reference values may vary among labs and hospitals. For this test, common reference values are: Adult: Less than 40 ng/mL or less than 40 mcg/L (SI units). Child younger than 1 year: Less than 30 ng/mL. If you are pregnant, the values may also vary based on how long you have been pregnant. What do the results mean? Results that are above the reference values in pregnant women may indicate the following for the baby: Neural tube defects, such as abnormalities of the spinal cord or brain. Abdominal wall defects. Multiple pregnancy such as twins. Fetal distress or fetal death. Results that are above the reference values in men or nonpregnant women may indicate: Reproductive cancers, such as ovarian or testicular cancer. Liver cancer. Liver cell death. Other types of cancer. Very low levels of AFP in pregnant women may indicate Down syndrome for the baby. Talk with your health care provider about what your results mean. Questions to ask your health care provider Ask your health care provider, or  the department that is doing the test: When will my results be ready? How will I get my results? What are my treatment options? What other tests do I need? What are my next steps? Summary The alpha-fetoprotein test is done on pregnant women to help screen for birth defects in their unborn baby. Certain cancers can cause a high level of AFP in men and nonpregnant women. For this test, a blood sample is usually collected by inserting a needle into a blood vessel. Talk with your health care provider about what your results mean. This information is not intended to replace advice given to you by your health care provider. Make sure you discuss any questions you have with your health care provider. Document Revised: 04/07/2020 Document Reviewed: 04/07/2020 Elsevier Patient Education  2022 [employer].

## 2021-06-05 NOTE — Progress Notes (Signed)
ROB 14.1 wks Episode of strong cramps over the weekend. Resolved. No vaginal bleeding. Denies urinary symptoms.  Was told she would be getting [place] today.

## 2021-06-05 NOTE — Progress Notes (Signed)
Subjective:  Candice White is a 30 y.o. G3P2002 at [redacted]w[redacted]d being seen today for ongoing prenatal care.  She is currently monitored for the following issues for this high-risk pregnancy and has Migraine headache; Depression; Generalized anxiety disorder; and Supervision of other normal pregnancy, antepartum on their problem list.  Patient reports no complaints.  Contractions: Not present. Vag. Bleeding: None.   . Denies leaking of fluid.   The following portions of the patient's history were reviewed and updated as appropriate: allergies, current medications, past family history, past medical history, past social history, past surgical history and problem list. Problem list updated.  Objective:   Vitals:   06/05/21 1501  BP: (!) 86/57  Pulse: 81  Weight: 103 lb (46.7 kg)    Fetal Status: Fetal Heart Rate (bpm): 140         General:  Alert, oriented and cooperative. Patient is in no acute distress.  Skin: Skin is warm and dry. No rash noted.   Cardiovascular: Normal heart rate noted  Respiratory: Normal respiratory effort, no problems with respiration noted  Abdomen: Soft, gravid, appropriate for gestational age. Pain/Pressure: Present     Pelvic: Vag. Bleeding: None     Cervical exam deferred        Extremities: Normal range of motion.  Edema: None  Mental Status: Normal mood and affect. Normal behavior. Normal judgment and thought content.   Urinalysis:      Assessment and Plan:  Pregnancy: G3P2002 at [redacted]w[redacted]d  1. Supervision of other normal pregnancy, antepartum -CBE info given -anatomy scan ordered -AFP next visit  PHQ9 SCORE ONLY 04/20/2021 08/30/2020 11/17/2019  PHQ-9 Total Score 4 17 17    GAD 7 : Generalized Anxiety Score 04/20/2021  Nervous, Anxious, on Edge 1  Control/stop worrying 0  Worry too much - different things 1  Trouble relaxing 1  Restless 1  Easily annoyed or irritable 3  Afraid - awful might happen 1  Total GAD 7 Score 8   2. [redacted] weeks gestation of  pregnancy  Preterm labor symptoms and general obstetric precautions including but not limited to vaginal bleeding, contractions, leaking of fluid and fetal movement were reviewed in detail with the patient. I discussed the assessment and treatment plan with the patient. The patient was provided an opportunity to ask questions and all were answered. The patient agreed with the plan and demonstrated an understanding of the instructions. The patient was advised to call back or seek an in-person office evaluation/go to MAU at [facility] for any urgent or concerning symptoms. Please refer to After Visit Summary for other counseling recommendations.  Return in about 4 weeks (around 07/03/2021) for in-person LOB/APP OK, needs anatomy scan.   Deirdre Gryder, 09/02/2021, NP

## 2021-06-07 ENCOUNTER — Encounter: Payer: Medicaid Other | Admitting: Obstetrics

## 2021-07-03 ENCOUNTER — Other Ambulatory Visit: Payer: Self-pay

## 2021-07-03 ENCOUNTER — Encounter: Payer: Self-pay | Admitting: Obstetrics

## 2021-07-03 ENCOUNTER — Ambulatory Visit (INDEPENDENT_AMBULATORY_CARE_PROVIDER_SITE_OTHER): Payer: Medicaid Other | Admitting: Obstetrics

## 2021-07-03 VITALS — BP 94/54 | HR 100 | Wt 108.0 lb

## 2021-07-03 DIAGNOSIS — Z3480 Encounter for supervision of other normal pregnancy, unspecified trimester: Secondary | ICD-10-CM

## 2021-07-03 NOTE — Progress Notes (Signed)
Subjective:  Candice White is a 30 y.o. G3P2002 at [redacted]w[redacted]d being seen today for ongoing prenatal care.  She is currently monitored for the following issues for this low-risk pregnancy and has Migraine headache; Depression; Generalized anxiety disorder; and Supervision of other normal pregnancy, antepartum on their problem list.  Patient reports no complaints.  Contractions: Not present. Vag. Bleeding: None.   . Denies leaking of fluid.   The following portions of the patient's history were reviewed and updated as appropriate: allergies, current medications, past family history, past medical history, past social history, past surgical history and problem list. Problem list updated.  Objective:   Vitals:   07/03/21 1013  BP: (!) 94/54  Pulse: 100  Weight: 108 lb (49 kg)    Fetal Status: Fetal Heart Rate (bpm): 153         General:  Alert, oriented and cooperative. Patient is in no acute distress.  Skin: Skin is warm and dry. No rash noted.   Cardiovascular: Normal heart rate noted  Respiratory: Normal respiratory effort, no problems with respiration noted  Abdomen: Soft, gravid, appropriate for gestational age. Pain/Pressure: Absent     Pelvic:  Cervical exam deferred        Extremities: Normal range of motion.  Edema: None  Mental Status: Normal mood and affect. Normal behavior. Normal judgment and thought content.   Urinalysis:      Assessment and Plan:  Pregnancy: G3P2002 at [redacted]w[redacted]d  1. Supervision of other normal pregnancy, antepartum Rx: - AFP, Serum, Open Spina Bifida  Preterm labor symptoms and general obstetric precautions including but not limited to vaginal bleeding, contractions, leaking of fluid and fetal movement were reviewed in detail with the patient. Please refer to After Visit Summary for other counseling recommendations.   Follow up in 4 weeks   Brock Bad, MD  07/03/21

## 2021-07-05 LAB — AFP, SERUM, OPEN SPINA BIFIDA
AFP MoM: 1.12
AFP Value: 71.3 ng/mL
Gest. Age on Collection Date: 18.1 weeks
Maternal Age At EDD: 29.9 yr
OSBR Risk 1 IN: 10000
Test Results:: NEGATIVE
Weight: 108 [lb_av]

## 2021-07-06 ENCOUNTER — Other Ambulatory Visit: Payer: Self-pay

## 2021-07-06 ENCOUNTER — Inpatient Hospital Stay (HOSPITAL_COMMUNITY)
Admission: EM | Admit: 2021-07-06 | Discharge: 2021-07-07 | Disposition: A | Discharge status: Home / Self Care | Payer: Medicaid Other | Attending: Family Medicine | Admitting: Family Medicine

## 2021-07-06 DIAGNOSIS — O26899 Other specified pregnancy related conditions, unspecified trimester: Secondary | ICD-10-CM

## 2021-07-06 DIAGNOSIS — R1031 Right lower quadrant pain: Secondary | ICD-10-CM | POA: Insufficient documentation

## 2021-07-06 DIAGNOSIS — Z3A18 18 weeks gestation of pregnancy: Secondary | ICD-10-CM | POA: Insufficient documentation

## 2021-07-06 DIAGNOSIS — O26892 Other specified pregnancy related conditions, second trimester: Secondary | ICD-10-CM | POA: Insufficient documentation

## 2021-07-07 ENCOUNTER — Encounter (HOSPITAL_COMMUNITY): Payer: Self-pay | Admitting: Emergency Medicine

## 2021-07-07 ENCOUNTER — Other Ambulatory Visit: Payer: Self-pay

## 2021-07-07 DIAGNOSIS — O26892 Other specified pregnancy related conditions, second trimester: Secondary | ICD-10-CM | POA: Diagnosis not present

## 2021-07-07 DIAGNOSIS — Z3A18 18 weeks gestation of pregnancy: Secondary | ICD-10-CM | POA: Diagnosis not present

## 2021-07-07 DIAGNOSIS — R102 Pelvic and perineal pain: Secondary | ICD-10-CM | POA: Diagnosis not present

## 2021-07-07 DIAGNOSIS — R1031 Right lower quadrant pain: Secondary | ICD-10-CM | POA: Diagnosis present

## 2021-07-07 LAB — URINALYSIS, ROUTINE W REFLEX MICROSCOPIC
Bilirubin Urine: NEGATIVE
Glucose, UA: NEGATIVE mg/dL
Hgb urine dipstick: NEGATIVE
Ketones, ur: NEGATIVE mg/dL
Leukocytes,Ua: NEGATIVE
Nitrite: NEGATIVE
Protein, ur: NEGATIVE mg/dL
Specific Gravity, Urine: 1.025 (ref 1.005–1.030)
pH: 5 (ref 5.0–8.0)

## 2021-07-07 NOTE — ED Triage Notes (Signed)
Patient is [redacted] weeks pregnant G3P2 , reports right lower abdominal cramping onset this afternoon , no vaginal bleeding or discharge . No emesis or fever .

## 2021-07-07 NOTE — Discharge Instructions (Signed)
Maternity support belt

## 2021-07-07 NOTE — ED Notes (Signed)
MAU RN notified on patient's condition and transfer.  

## 2021-07-07 NOTE — MAU Provider Note (Signed)
History     CSN: [id number]  Arrival date and time: 07/06/21 2310   Event Date/Time   First Provider Initiated Contact with Patient 07/07/21 0102      Chief Complaint  Patient presents with   Abdominal Pain   HPI Candice White is a 30 y.o. G3P2002 at 10w5dwho presents with abdominal pain.  Reports that pain is primarily in the right lower quadrant and suprapubic area.  Describes pain as cramp-like and occurs with lying on her left side and walking.  Pain started this morning.  Currently rates pain 0 out of 10.  Has not treated symptoms.  Does not have a maternity support belt.  Denies fever, nausea, vomiting, dysuria, hematuria, flank pain, vaginal bleeding, loss of fluid.  OB History     Gravida  3   Para  2   Term  2   Preterm      AB      Living  2      SAB      IAB      Ectopic      Multiple  0   Live Births  2           Past Medical History:  Diagnosis Date   BV (bacterial vaginosis)    Migraine    chronic ha    PID (acute pelvic inflammatory disease)    Prolonged spontaneous rupture of membranes 08/08/2018    Past Surgical History:  Procedure Laterality Date   NO PAST SURGERIES      Family History  Problem Relation Age of Onset   Cancer Maternal Aunt        lung cancer   Cancer Cousin        lung   Hypertension Mother     Social History   Tobacco Use   Smoking status: Former    Years: 3.00    Types: Cigarettes    Quit date: 12/08/2012    Years since quitting: 8.5   Smokeless tobacco: Former    Quit date: 10/02/2014  Vaping Use   Vaping Use: Never used  Substance Use Topics   Alcohol use: Not Currently    Comment: in past   Drug use: Not Currently    Types: Marijuana    Comment: last smoked was when found out was pregnant with current pregnancy     Allergies: No Known Allergies  Medications Prior to Admission  Medication Sig Dispense Refill Last Dose   Prenatal Vit-Fe Phos-FA-Omega (VITAFOL GUMMIES) 3.33-0.333-34.8 MG  CHEW TAKE 3 TABLETS ONCE DAILY 90 tablet 11 07/07/2021   acetaminophen (TYLENOL) 325 MG tablet Take 2 tablets (650 mg total) by mouth every 4 (four) hours as needed (for pain scale < 4  OR  temperature  >/=  100.5 F). (Patient not taking: Reported on 05/10/2021) 30 tablet 0    Blood Pressure Monitoring (BLOOD PRESSURE KIT) DEVI 1 kit by Does not apply route once a week. Check Blood Pressure regularly and record readings into the [employer] App.  Large Cuff.  DX O90.0 (Patient not taking: Reported on 05/10/2021) 1 each 0    Doxylamine-Pyridoxine (DICLEGIS) 10-10 MG TBEC Take 2 tablets by mouth at bedtime. If symptoms persist, add one tablet in the morning and one in the afternoon (Patient not taking: No sig reported) 100 tablet 5    Misc. Devices (GOJJI WEIGHT SCALE) MISC 1 Device by Does not apply route once a week. (Patient not taking: Reported on 05/10/2021) 1 each 0  promethazine (PHENERGAN) 25 MG tablet Take 1 tablet (25 mg total) by mouth every 6 (six) hours as needed for nausea or vomiting. (Patient not taking: No sig reported) 30 tablet 0     Review of Systems  Constitutional: Negative.   Gastrointestinal:  Positive for abdominal pain. Negative for constipation, diarrhea, nausea and vomiting.  Genitourinary: Negative.   Physical Exam   Blood pressure (!) 93/58, pulse 73, temperature 98 F (36.7 C), temperature source Oral, resp. rate 14, height 5' 3" (1.6 m), weight 55 kg, last menstrual period 02/16/2021, SpO2 100 %, unknown if currently breastfeeding.  Physical Exam Vitals and nursing note reviewed. Exam conducted with a chaperone present.  Constitutional:      Appearance: She is well-developed. She is not ill-appearing.  HENT:     Head: Normocephalic and atraumatic.  Eyes:     General: No scleral icterus. Pulmonary:     Effort: Pulmonary effort is normal. No respiratory distress.  Abdominal:     Palpations: Abdomen is soft.     Tenderness: There is no abdominal tenderness. There  is no right CVA tenderness, left CVA tenderness, guarding or rebound.  Genitourinary:    Vagina: Normal.     Comments: Dilation: Closed Effacement (%): Thick Cervical Position: Middle Station: -3 Exam by:: Jorje Guild NP  Skin:    General: Skin is warm and dry.  Neurological:     General: No focal deficit present.     Mental Status: She is alert.  Psychiatric:        Mood and Affect: Mood normal.        Behavior: Behavior normal.    MAU Course  Procedures Results for orders placed or performed during the hospital encounter of 07/06/21 (from the past 24 hour(s))  Urinalysis, Routine w reflex microscopic Urine, Clean Catch     Status: None   Collection Time: 07/07/21 12:44 AM  Result Value Ref Range   Color, Urine YELLOW YELLOW   APPearance CLEAR CLEAR   Specific Gravity, Urine 1.025 1.005 - 1.030   pH 5.0 5.0 - 8.0   Glucose, UA NEGATIVE NEGATIVE mg/dL   Hgb urine dipstick NEGATIVE NEGATIVE   Bilirubin Urine NEGATIVE NEGATIVE   Ketones, ur NEGATIVE NEGATIVE mg/dL   Protein, ur NEGATIVE NEGATIVE mg/dL   Nitrite NEGATIVE NEGATIVE   Leukocytes,Ua NEGATIVE NEGATIVE    MDM Patient presents with abdominal pain that occurs in certain positions and walking.  Fetal heart tones present via Doppler.  Cervix is closed and thick.  Negative UA.  No other symptoms.  Description of pain and exam consistent with round ligament pain.  Recommend maternity support belt  Assessment and Plan   1. Pain of round ligament affecting pregnancy, antepartum   2. [redacted] weeks gestation of pregnancy    -start wearing maternity support belt -reviewed s/s of sab & reasons to return to MAU -keep f/u with OB  Jorje Guild 07/07/2021, 1:19 AM

## 2021-07-07 NOTE — ED Provider Notes (Signed)
Emergency Medicine Provider OB Triage Evaluation Note  Candice White is a 30 y.o. female, G3P2002, at [redacted]w[redacted]d gestation who presents to the emergency department with complaints of RLQ abd pain since this 2-3 pm constant since.  No vaginal bleeding or vaginal discharge.  She is 8-1/[redacted] weeks pregnant.  Review of  Systems  Positive: Abdominal pain Negative: Vaginal discharge  Physical Exam  BP 90/61   Pulse 88   Temp (!) 97.4 F (36.3 C)   Resp 16   Ht 5\' 3"  (1.6 m)   Wt 55 kg   LMP 02/16/2021 (Approximate)   SpO2 100%   BMI 21.48 kg/m  General: Awake, no distress  HEENT: Atraumatic  Resp: Normal effort  Cardiac: Normal rate Abd: Nondistended, no focal tenderness palpation MSK: Moves all extremities without difficulty Neuro: Speech clear  Medical Decision Making  Pt evaluated for pregnancy concern and is stable for transfer to MAU. Pt is in agreement with plan for transfer.  12:25 AM Discussed with MAU APP, 02/18/2021, who accepts patient in transfer.  Clinical Impression  No diagnosis found.     Judeth Horn [place], DOLE 07/07/21 0026    09/06/21, MD 07/08/21 [phone]

## 2021-07-07 NOTE — MAU Note (Signed)
Pt reports rt lower abd cramping today, denies bleeding.

## 2021-07-09 ENCOUNTER — Ambulatory Visit: Payer: Medicaid Other

## 2021-07-13 ENCOUNTER — Other Ambulatory Visit: Payer: Self-pay

## 2021-07-13 ENCOUNTER — Ambulatory Visit: Payer: Medicaid Other | Attending: Women's Health

## 2021-07-13 DIAGNOSIS — Z3480 Encounter for supervision of other normal pregnancy, unspecified trimester: Secondary | ICD-10-CM | POA: Insufficient documentation

## 2021-07-24 ENCOUNTER — Inpatient Hospital Stay (HOSPITAL_COMMUNITY)
Admission: AD | Admit: 2021-07-24 | Discharge: 2021-07-24 | Discharge status: Against Medical Advice | Payer: Medicaid Other | Attending: Obstetrics and Gynecology | Admitting: Obstetrics and Gynecology

## 2021-07-24 ENCOUNTER — Encounter (HOSPITAL_COMMUNITY): Payer: Self-pay | Admitting: Obstetrics and Gynecology

## 2021-07-24 ENCOUNTER — Other Ambulatory Visit: Payer: Self-pay

## 2021-07-24 DIAGNOSIS — Z3480 Encounter for supervision of other normal pregnancy, unspecified trimester: Secondary | ICD-10-CM

## 2021-07-24 DIAGNOSIS — Z3482 Encounter for supervision of other normal pregnancy, second trimester: Secondary | ICD-10-CM | POA: Diagnosis present

## 2021-07-24 DIAGNOSIS — Z3A22 22 weeks gestation of pregnancy: Secondary | ICD-10-CM | POA: Insufficient documentation

## 2021-07-24 NOTE — Progress Notes (Signed)
Pt not in lobby.  

## 2021-07-24 NOTE — Progress Notes (Signed)
Pt not in lobby and not at [facility]

## 2021-07-24 NOTE — MAU Note (Signed)
Last night about 2100 I fell down about 4 steps and hit my stomach on the floor. Denies VB or LOF but has not felt FM today. When dopplered FHTs in Triage could hear FM what sounded like FM but pt did not feel FM. No pain currently

## 2021-07-24 NOTE — MAU Note (Addendum)
After Triage pt stated she was going to pick up her food and then return.

## 2021-07-31 ENCOUNTER — Other Ambulatory Visit: Payer: Self-pay

## 2021-07-31 ENCOUNTER — Ambulatory Visit (INDEPENDENT_AMBULATORY_CARE_PROVIDER_SITE_OTHER): Payer: Medicaid Other

## 2021-07-31 VITALS — BP 112/70 | HR 85 | Wt 111.0 lb

## 2021-07-31 DIAGNOSIS — Z3480 Encounter for supervision of other normal pregnancy, unspecified trimester: Secondary | ICD-10-CM

## 2021-07-31 DIAGNOSIS — Z3A22 22 weeks gestation of pregnancy: Secondary | ICD-10-CM

## 2021-07-31 NOTE — Progress Notes (Signed)
   LOW-RISK PREGNANCY OFFICE VISIT  Patient name: Candice White MRN [id number]  Date of birth: [birth year] Chief Complaint:   Routine Prenatal Visit  Subjective:   Candice White is a 30 y.o. G4P2002 female at [redacted]w[redacted]d with an Estimated Date of Delivery: 12/03/21 being seen today for ongoing management of a low-risk pregnancy aeb has Migraine headache; Depression; Generalized anxiety disorder; and Supervision of other normal pregnancy, antepartum on their problem list.  Patient presents today with no complaints.  Patient endorses fetal movement. Patient denies abdominal cramping or contractions.  Patient denies vaginal concerns including abnormal discharge, leaking of fluid, and bleeding.  Contractions: Not present. Vag. Bleeding: None.  Movement: Present.  Reviewed past medical,surgical, social, obstetrical and family history as well as problem list, medications and allergies.  Objective   Vitals:   07/31/21 1525  BP: 112/70  Pulse: 85  Weight: 111 lb (50.3 kg)  Body mass index is 19.66 kg/m.  Total Weight Gain:8 lb (3.629 kg)         Physical Examination:   General appearance: Well appearing, and in no distress  Mental status: Alert, oriented to person, place, and time  Skin: Warm & dry  Cardiovascular: Normal heart rate noted  Respiratory: Normal respiratory effort, no distress  Abdomen: Soft, gravid, nontender, AGA with fundus at umbilicus.  Pelvic: Cervical exam deferred           Extremities: Edema: None  Fetal Status: Fetal Heart Rate (bpm): 146  Movement: Present   No results found for this or any previous visit (from the past 24 hour(s)).  Assessment & Plan:  Low-risk pregnancy of a 30 y.o., G3P2002 at [redacted]w[redacted]d with an Estimated Date of Delivery: 12/03/21   1. Supervision of other normal pregnancy, antepartum -Anticipatory guidance for upcoming appts. -Patient to schedule next appt in 4 weeks for an in-person visit. -Reviewed US findings.  -Reviewed Glucola appt preparation  including fasting the night before and morning of.   *Informed that it is okay to drink plain water throughout the night and prior to consumption of Glucola formula.  -Discussed anticipated office time of 2.5-3 hours.  -Reviewed blood draw procedures and labs which also include check of iron/HgB level, RPR, and HIV *Informed that repeat RPR/HIV are for pediatric records/compliance.  -Discussed how results of GTT are handled including diabetic education and BS testing for abnormal results and routine care for normal results.   2. [redacted] weeks gestation of pregnancy -Doing well. -Reviewed weight gain and vitals.     Meds: No orders of the defined types were placed in this encounter.  Labs/procedures today:  Lab Orders  No laboratory test(s) ordered today     Reviewed: Preterm labor symptoms and general obstetric precautions including but not limited to vaginal bleeding, contractions, leaking of fluid and fetal movement were reviewed in detail with the patient.  All questions were answered.  Follow-up: No follow-ups on file.  No orders of the defined types were placed in this encounter.  Cherre Robins MSN, CNM 07/31/2021

## 2021-09-04 ENCOUNTER — Other Ambulatory Visit: Payer: Medicaid Other

## 2021-09-04 ENCOUNTER — Encounter: Payer: Medicaid Other | Admitting: Nurse Practitioner

## 2021-09-06 ENCOUNTER — Other Ambulatory Visit: Payer: Medicaid Other

## 2021-09-11 ENCOUNTER — Other Ambulatory Visit: Payer: Self-pay

## 2021-09-11 ENCOUNTER — Ambulatory Visit (INDEPENDENT_AMBULATORY_CARE_PROVIDER_SITE_OTHER): Payer: Medicaid Other | Admitting: Obstetrics and Gynecology

## 2021-09-11 ENCOUNTER — Other Ambulatory Visit: Payer: Medicaid Other

## 2021-09-11 VITALS — BP 99/61 | HR 70 | Wt 123.0 lb

## 2021-09-11 DIAGNOSIS — O99013 Anemia complicating pregnancy, third trimester: Secondary | ICD-10-CM

## 2021-09-11 DIAGNOSIS — Z3480 Encounter for supervision of other normal pregnancy, unspecified trimester: Secondary | ICD-10-CM

## 2021-09-11 NOTE — Progress Notes (Signed)
   PRENATAL VISIT NOTE  Subjective:  Candice White is a 30 y.o. G3P2002 at [redacted]w[redacted]d being seen today for ongoing prenatal care.  She is currently monitored for the following issues for this low-risk pregnancy and has Migraine headache; Depression; Generalized anxiety disorder; and Supervision of other normal pregnancy, antepartum on their problem list.  Patient reports no complaints.  Contractions: Irritability. Vag. Bleeding: None.  Movement: Present. Denies leaking of fluid.   The following portions of the patient's history were reviewed and updated as appropriate: allergies, current medications, past family history, past medical history, past social history, past surgical history and problem list.   Objective:   Vitals:   09/11/21 0939  BP: 99/61  Pulse: 70  Weight: 123 lb (55.8 kg)    Fetal Status: Fetal Heart Rate (bpm): 138   Movement: Present     General:  Alert, oriented and cooperative. Patient is in no acute distress.  Skin: Skin is warm and dry. No rash noted.   Cardiovascular: Normal heart rate noted  Respiratory: Normal respiratory effort, no problems with respiration noted  Abdomen: Soft, gravid, appropriate for gestational age.  Pain/Pressure: Present     Pelvic: Cervical exam deferred        Extremities: Normal range of motion.  Edema: None  Mental Status: Normal mood and affect. Normal behavior. Normal judgment and thought content.   Assessment and Plan:  Pregnancy: G3P2002 at [redacted]w[redacted]d 1. Supervision of other normal pregnancy, antepartum 28w labs today  TDAP offered today- after discussion she will consider She would like something long acting potentially for birth control vs BTS. Discussed pros/cons and benefits of each. Discussed permanent nature of BTS. Discussed ppBTS vs interval salpingectomy.   Preterm labor symptoms and general obstetric precautions including but not limited to vaginal bleeding, contractions, leaking of fluid and fetal movement were reviewed in  detail with the patient. Please refer to After Visit Summary for other counseling recommendations.   No follow-ups on file.  Future Appointments  Date Time Provider Department Center  09/11/2021  9:55 AM Milas Hock, MD [facility] None    Milas Hock, MD

## 2021-09-12 LAB — CBC
Hematocrit: 30.7 % — ABNORMAL LOW (ref 34.0–46.6)
Hemoglobin: 10.1 g/dL — ABNORMAL LOW (ref 11.1–15.9)
MCH: 28.7 pg (ref 26.6–33.0)
MCHC: 32.9 g/dL (ref 31.5–35.7)
MCV: 87 fL (ref 79–97)
Platelets: 335 10*3/uL (ref 150–450)
RBC: 3.52 x10E6/uL — ABNORMAL LOW (ref 3.77–5.28)
RDW: 11.8 % (ref 11.7–15.4)
WBC: 11.2 10*3/uL — ABNORMAL HIGH (ref 3.4–10.8)

## 2021-09-12 LAB — RPR: RPR Ser Ql: NONREACTIVE

## 2021-09-12 LAB — GLUCOSE TOLERANCE, 2 HOURS W/ 1HR
Glucose, 1 hour: 109 mg/dL (ref 70–179)
Glucose, 2 hour: 102 mg/dL (ref 70–152)
Glucose, Fasting: 79 mg/dL (ref 70–91)

## 2021-09-12 LAB — HIV ANTIBODY (ROUTINE TESTING W REFLEX): HIV Screen 4th Generation wRfx: NONREACTIVE

## 2021-09-13 DIAGNOSIS — O99013 Anemia complicating pregnancy, third trimester: Secondary | ICD-10-CM | POA: Insufficient documentation

## 2021-09-13 MED ORDER — FERROUS SULFATE 325 (65 FE) MG PO TABS: 325.0000 mg | ORAL_TABLET | ORAL | 3 refills | Status: DC

## 2021-09-13 NOTE — Addendum Note (Signed)
Addended by: Milas Hock A on: 09/13/2021 09:57 AM   Modules accepted: Orders

## 2021-09-30 NOTE — L&D Delivery Note (Addendum)
Candice White is a 32 y.o. female G74P2002 with IUP at [redacted]w[redacted]d admitted for spontaneous onset of labor .  She progressed with AROM augmentation to complete and pushed 1 time to deliver.  Cord clamping delayed by several minutes then clamped by CNM and cut by MD.   ? ?Delivery Note ?At 1:11 PM a viable female was delivered via Vaginal, Spontaneous (Presentation:   Occiput Anterior) by Dr. Debroah Loop.  APGAR: 9, 9; weight pending.   ?Placenta status: Spontaneous, Intact.  Cord: 3 vessels with the following complications: None. ? ?Anesthesia: None ?Episiotomy: None ?Lacerations: None ?Suture Repair:  n/a ?Est. Blood Loss (mL): 250 ? ?Brisk bright red bleeding with delivery of placenta. TXA given and bleeding resolved on reassessment.  ? ?Mom to postpartum.  Baby to Couplet care / Skin to Skin. ? ?Rolm Bookbinder CNM ?12/08/2021, 1:35 PM ? ? ? ?

## 2021-10-02 ENCOUNTER — Telehealth: Payer: Self-pay

## 2021-10-02 NOTE — Telephone Encounter (Signed)
Patient called and left a message on triage vm requesting a note for work.  Returned patients call. No answer. Left vm for patient to return call to the office.

## 2021-10-03 ENCOUNTER — Encounter: Payer: Medicaid Other | Admitting: Women's Health

## 2021-10-22 ENCOUNTER — Ambulatory Visit (INDEPENDENT_AMBULATORY_CARE_PROVIDER_SITE_OTHER): Payer: Medicaid Other | Admitting: Obstetrics and Gynecology

## 2021-10-22 ENCOUNTER — Encounter: Payer: Self-pay | Admitting: Obstetrics and Gynecology

## 2021-10-22 ENCOUNTER — Other Ambulatory Visit: Payer: Self-pay

## 2021-10-22 VITALS — BP 112/74 | HR 91 | Wt 127.0 lb

## 2021-10-22 DIAGNOSIS — Z3480 Encounter for supervision of other normal pregnancy, unspecified trimester: Secondary | ICD-10-CM

## 2021-10-22 DIAGNOSIS — O99013 Anemia complicating pregnancy, third trimester: Secondary | ICD-10-CM

## 2021-10-22 NOTE — Progress Notes (Signed)
Pt presents for ROB c/o watery discharge.

## 2021-10-22 NOTE — Patient Instructions (Signed)
?  Considering Waterbirth? ?Guide for patients at [facility] ([facility]) ?Why consider waterbirth? ?Gentle birth for babies  ?Less pain medicine used in labor  ?May allow for passive descent/less pushing  ?May reduce perineal tears  ?More mobility and instinctive maternal position changes  ?Increased maternal relaxation  ? ?Is waterbirth safe? What are the risks of infection, drowning or other complications? ?Infection:  ?Very low risk (3.7 % for tub vs 4.8% for bed)  ?7 in 8000 waterbirths with documented infection  ?Poorly cleaned equipment most common cause  ?Slightly lower group B strep transmission rate  ?Drowning  ?Maternal:  ?Very low risk  ?Related to seizures or fainting  ?Newborn:  ?Very low risk. No evidence of increased risk of respiratory problems in multiple large studies  ?Physiological protection from breathing under water  ?Avoid underwater birth if there are any fetal complications  ?Once baby's head is out of the water, keep it out.  ?Birth complication  ?Some reports of cord trauma, but risk decreased by bringing baby to surface gradually  ?No evidence of increased risk of shoulder dystocia. Mothers can usually change positions faster in water than in a bed, possibly aiding the maneuvers to free the shoulder.  ? ?There are 2 things you MUST do to have a waterbirth with [facility]: ?Attend a waterbirth class at [facility]   ?3rd Wednesday of every month from 7-9 pm (virtual during COVID) ?Free ?Register online at [email or web address] or [email or web address] or by calling [phone] ?Bring us the certificate from the class to your prenatal appointment or send via MyChart ?Meet with a midwife at 36 weeks* to see if you can still plan a waterbirth and to sign the consent.  ? ?*We also recommend that you schedule as many of your prenatal visits with a midwife as possible.   ? ?Helpful information: ?You may want to bring a bathing suit top to the hospital  to wear during labor but this is optional.  All other supplies are provided by the hospital. ?Please arrive at the hospital with signs of active labor, and do not wait at home until late in labor. It takes 45 min- 2 hours for COVID testing, fetal monitoring, and check in to your room to take place, plus transport and filling of the waterbirth tub.   ? ?Things that would prevent you from having a waterbirth: ?Unknown or Positive COVID-19 diagnosis upon admission to hospital* ?Premature, <37wks  ?Previous cesarean birth  ?Presence of thick meconium-stained fluid  ?Multiple gestation (Twins, triplets, etc.)  ?Uncontrolled diabetes or gestational diabetes requiring medication  ?Hypertension diagnosed in pregnancy or preexisting hypertension (gestational hypertension, preeclampsia, or chronic hypertension) ?Fetal growth restriction (your baby measures less than 10th percentile on ultrasound) ?Heavy vaginal bleeding  ?Non-reassuring fetal heart rate  ?Active infection (MRSA, etc.). Group B Strep is NOT a contraindication for waterbirth.  ?If your labor has to be induced and induction method requires continuous monitoring of the baby's heart rate  ?Other risks/issues identified by your obstetrical provider  ? ?Please remember that birth is unpredictable. Under certain unforeseeable circumstances your provider may advise against giving birth in the tub. These decisions will be made on a case-by-case basis and with the safety of you and your baby as our highest priority. ? ? ?*Please remember that in order to have a waterbirth, you must test Negative to COVID-19 upon admission to the hospital. ? ?Updated 01/08/21 ? ?

## 2021-10-22 NOTE — Progress Notes (Signed)
° °  PRENATAL VISIT NOTE  Subjective:  Candice White is a 31 y.o. G3P2002 at [redacted]w[redacted]d being seen today for ongoing prenatal care.  She is currently monitored for the following issues for this low-risk pregnancy and has Migraine headache; Depression; Generalized anxiety disorder; Supervision of other normal pregnancy, antepartum; and Anemia of pregnancy in third trimester on their problem list.  Patient reports no complaints.  Contractions: Irritability. Vag. Bleeding: None.  Movement: Present. Denies leaking of fluid.   The following portions of the patient's history were reviewed and updated as appropriate: allergies, current medications, past family history, past medical history, past social history, past surgical history and problem list.   Objective:   Vitals:   10/22/21 1315  BP: 112/74  Pulse: 91  Weight: 127 lb (57.6 kg)    Fetal Status: Fetal Heart Rate (bpm): 138 Fundal Height: 34 cm Movement: Present     General:  Alert, oriented and cooperative. Patient is in no acute distress.  Skin: Skin is warm and dry. No rash noted.   Cardiovascular: Normal heart rate noted  Respiratory: Normal respiratory effort, no problems with respiration noted  Abdomen: Soft, gravid, appropriate for gestational age.  Pain/Pressure: Present     Pelvic: Cervical exam deferred        Extremities: Normal range of motion.  Edema: None  Mental Status: Normal mood and affect. Normal behavior. Normal judgment and thought content.   Assessment and Plan:  Pregnancy: G3P2002 at [redacted]w[redacted]d 1. Supervision of other normal pregnancy, antepartum Patient is doing well without complaints Cultures next visit Patient no longer interested in BTL and is considering postplacental IUD  2. Anemia of pregnancy in third trimester Patient plans to start iron supplement  Preterm labor symptoms and general obstetric precautions including but not limited to vaginal bleeding, contractions, leaking of fluid and fetal movement were  reviewed in detail with the patient. Please refer to After Visit Summary for other counseling recommendations.   Return in about 2 weeks (around 11/05/2021) for in person, ROB, Low risk, GBS.  Future Appointments  Date Time Provider Department Center  10/22/2021  2:50 PM Tarrah Furuta, Gigi Gin, MD [facility] None    Catalina Antigua, MD

## 2021-11-05 ENCOUNTER — Other Ambulatory Visit: Payer: Self-pay

## 2021-11-05 ENCOUNTER — Ambulatory Visit: Payer: Medicaid Other | Admitting: Licensed Clinical Social Worker

## 2021-11-05 ENCOUNTER — Other Ambulatory Visit (HOSPITAL_COMMUNITY)
Admission: RE | Admit: 2021-11-05 | Discharge: 2021-11-05 | Disposition: A | Discharge status: Home / Self Care | Payer: Medicaid Other | Source: Ambulatory Visit | Attending: Advanced Practice Midwife | Admitting: Advanced Practice Midwife

## 2021-11-05 ENCOUNTER — Ambulatory Visit (INDEPENDENT_AMBULATORY_CARE_PROVIDER_SITE_OTHER): Payer: Medicaid Other | Admitting: Advanced Practice Midwife

## 2021-11-05 VITALS — BP 94/62 | HR 83 | Wt 126.0 lb

## 2021-11-05 DIAGNOSIS — O99013 Anemia complicating pregnancy, third trimester: Secondary | ICD-10-CM

## 2021-11-05 DIAGNOSIS — Z3A36 36 weeks gestation of pregnancy: Secondary | ICD-10-CM

## 2021-11-05 DIAGNOSIS — Z3480 Encounter for supervision of other normal pregnancy, unspecified trimester: Secondary | ICD-10-CM | POA: Diagnosis not present

## 2021-11-05 LAB — OB RESULTS CONSOLE GC/CHLAMYDIA: Gonorrhea: NEGATIVE

## 2021-11-05 NOTE — Patient Instructions (Signed)
Labor Precautions Reasons to come to MAU at [facility]:  1.  Contractions are  5 minutes apart or less, each last 1 minute, these have been going on for 1-2 hours, and you cannot walk or talk during them 2.  You have a large gush of fluid, or a trickle of fluid that will not stop and you have to wear a pad 3.  You have bleeding that is bright red, heavier than spotting--like menstrual bleeding (spotting can be normal in early labor or after a check of your cervix) 4.  You do not feel the baby moving like he/she normally does  

## 2021-11-05 NOTE — Progress Notes (Signed)
° °  PRENATAL VISIT NOTE  Subjective:  Candice White is a 31 y.o. G3P2002 at [redacted]w[redacted]d being seen today for ongoing prenatal care.  She is currently monitored for the following issues for this low-risk pregnancy and has Migraine headache; Depression; Generalized anxiety disorder; Supervision of other normal pregnancy, antepartum; and Anemia of pregnancy in third trimester on their problem list.  Patient reports no complaints.  Contractions: Irritability. Vag. Bleeding: None.  Movement: Present. Denies leaking of fluid.   The following portions of the patient's history were reviewed and updated as appropriate: allergies, current medications, past family history, past medical history, past social history, past surgical history and problem list.   Objective:   Vitals:   11/05/21 1345  BP: 94/62  Pulse: 83  Weight: 126 lb (57.2 kg)    Fetal Status: Fetal Heart Rate (bpm): 160   Movement: Present     General:  Alert, oriented and cooperative. Patient is in no acute distress.  Skin: Skin is warm and dry. No rash noted.   Cardiovascular: Normal heart rate noted  Respiratory: Normal respiratory effort, no problems with respiration noted  Abdomen: Soft, gravid, appropriate for gestational age.  Pain/Pressure: Present     Pelvic: Cervical exam performed in the presence of a chaperone        Extremities: Normal range of motion.  Edema: None  Mental Status: Normal mood and affect. Normal behavior. Normal judgment and thought content.   Assessment and Plan:  Pregnancy: G3P2002 at [redacted]w[redacted]d 1. Supervision of other normal pregnancy, antepartum --Anticipatory guidance about next visits/weeks of pregnancy given.  --GCC, GBS collected  2. Anemia of pregnancy in third trimester --No s/sx, taking oral iron  3. [redacted] weeks gestation of pregnancy    Preterm labor symptoms and general obstetric precautions including but not limited to vaginal bleeding, contractions, leaking of fluid and fetal movement were  reviewed in detail with the patient. Please refer to After Visit Summary for other counseling recommendations.   No follow-ups on file.  Future Appointments  Date Time Provider [place]  11/05/2021  2:30 PM Lynnea Ferrier, [place] None    Fatima Blank, CNM

## 2021-11-05 NOTE — Progress Notes (Signed)
Patient presents for ROB and GBS. PHQ9 : 6 GAD 7: 6 Patient has no other concerns

## 2021-11-07 LAB — STREP GP B NAA: Strep Gp B NAA: NEGATIVE

## 2021-11-07 LAB — CERVICOVAGINAL ANCILLARY ONLY
Chlamydia: NEGATIVE
Comment: NEGATIVE
Comment: NORMAL
Neisseria Gonorrhea: NEGATIVE

## 2021-11-12 ENCOUNTER — Ambulatory Visit (INDEPENDENT_AMBULATORY_CARE_PROVIDER_SITE_OTHER): Payer: Medicaid Other | Admitting: Obstetrics and Gynecology

## 2021-11-12 ENCOUNTER — Encounter: Payer: Self-pay | Admitting: Obstetrics and Gynecology

## 2021-11-12 ENCOUNTER — Other Ambulatory Visit: Payer: Self-pay

## 2021-11-12 VITALS — BP 109/72 | HR 80 | Wt 128.7 lb

## 2021-11-12 DIAGNOSIS — Z3480 Encounter for supervision of other normal pregnancy, unspecified trimester: Secondary | ICD-10-CM

## 2021-11-12 DIAGNOSIS — O99013 Anemia complicating pregnancy, third trimester: Secondary | ICD-10-CM

## 2021-11-12 NOTE — Progress Notes (Signed)
Pt in office for routine prenatal visit. She has no complaints today.

## 2021-11-12 NOTE — Progress Notes (Signed)
° °  PRENATAL VISIT NOTE  Subjective:  Candice White is a 31 y.o. G3P2002 at [redacted]w[redacted]d being seen today for ongoing prenatal care.  She is currently monitored for the following issues for this low-risk pregnancy and has Migraine headache; Depression; Generalized anxiety disorder; Supervision of other normal pregnancy, antepartum; and Anemia of pregnancy in third trimester on their problem list.  Patient reports no complaints.  Contractions: Irritability. Vag. Bleeding: None.  Movement: Present. Denies leaking of fluid.   The following portions of the patient's history were reviewed and updated as appropriate: allergies, current medications, past family history, past medical history, past social history, past surgical history and problem list.   Objective:   Vitals:   11/12/21 0845  BP: 109/72  Pulse: 80  Weight: 128 lb 11.2 oz (58.4 kg)    Fetal Status: Fetal Heart Rate (bpm): 137 Fundal Height: 37 cm Movement: Present     General:  Alert, oriented and cooperative. Patient is in no acute distress.  Skin: Skin is warm and dry. No rash noted.   Cardiovascular: Normal heart rate noted  Respiratory: Normal respiratory effort, no problems with respiration noted  Abdomen: Soft, gravid, appropriate for gestational age.  Pain/Pressure: Present     Pelvic: Cervical exam deferred        Extremities: Normal range of motion.  Edema: None  Mental Status: Normal mood and affect. Normal behavior. Normal judgment and thought content.   Assessment and Plan:  Pregnancy: G3P2002 at [redacted]w[redacted]d 1. Supervision of other normal pregnancy, antepartum Patient is doing well without complaints Reviewed negative vaginal swabs from last visit  2. Anemia of pregnancy in third trimester Continue oral iron  Term labor symptoms and general obstetric precautions including but not limited to vaginal bleeding, contractions, leaking of fluid and fetal movement were reviewed in detail with the patient. Please refer to After  Visit Summary for other counseling recommendations.   Return in about 1 week (around 11/19/2021) for in person, ROB, Low risk.  Future Appointments  Date Time Provider [place]  11/16/2021  9:45 AM Lynnea Ferrier, LCSW [facility] None  11/19/2021  3:50 PM Woodroe Mode, MD [facility] None    Mora Bellman, MD

## 2021-11-16 ENCOUNTER — Encounter: Payer: Medicaid Other | Admitting: Licensed Clinical Social Worker

## 2021-11-16 ENCOUNTER — Telehealth: Payer: Self-pay | Admitting: Licensed Clinical Social Worker

## 2021-11-16 NOTE — Telephone Encounter (Signed)
Called pt regarding schedule mychart visit. Left message requesting callback  

## 2021-11-19 ENCOUNTER — Encounter: Payer: Medicaid Other | Admitting: Obstetrics & Gynecology

## 2021-11-21 ENCOUNTER — Encounter: Payer: Medicaid Other | Admitting: Obstetrics

## 2021-11-21 ENCOUNTER — Ambulatory Visit (INDEPENDENT_AMBULATORY_CARE_PROVIDER_SITE_OTHER): Payer: Medicaid Other | Admitting: Advanced Practice Midwife

## 2021-11-21 ENCOUNTER — Encounter: Payer: Self-pay | Admitting: Advanced Practice Midwife

## 2021-11-21 ENCOUNTER — Other Ambulatory Visit: Payer: Self-pay

## 2021-11-21 VITALS — BP 105/56 | HR 75 | Wt 131.0 lb

## 2021-11-21 DIAGNOSIS — Z3480 Encounter for supervision of other normal pregnancy, unspecified trimester: Secondary | ICD-10-CM

## 2021-11-21 DIAGNOSIS — Z3A38 38 weeks gestation of pregnancy: Secondary | ICD-10-CM

## 2021-11-21 NOTE — Progress Notes (Signed)
Pt presents for ROB requests cx check today.  

## 2021-11-21 NOTE — Patient Instructions (Signed)
Things to Try After 37 weeks to Encourage Labor/Get Ready for Labor:    Try the [employer] at www.milescircuit.com daily to improve baby's position and encourage the onset of labor.  Walk a little and rest a little every day.  Change positions often.  Cervical Ripening: May try one or both Red Raspberry Leaf capsules or tea:  two 300mg or 400mg tablets with each meal, 2-3 times a day, or 1-3 cups of tea daily  Potential Side Effects Of Raspberry Leaf:  Most women do not experience any side effects from drinking raspberry leaf tea. However, nausea and loose stools are possible   Evening Primrose Oil capsules: take 1 capsule by mouth and place one capsule in the vagina every night.    Some of the potential side effects:  Upset stomach  Loose stools or diarrhea  Headaches  Nausea  Sex can also help the cervix ripen and encourage labor onset.    Labor Precautions Reasons to come to MAU at [facility]:  1.  Contractions are  5 minutes apart or less, each last 1 minute, these have been going on for 1-2 hours, and you cannot walk or talk during them 2.  You have a large gush of fluid, or a trickle of fluid that will not stop and you have to wear a pad 3.  You have bleeding that is bright red, heavier than spotting--like menstrual bleeding (spotting can be normal in early labor or after a check of your cervix) 4.  You do not feel the baby moving like he/she normally does  

## 2021-11-21 NOTE — Progress Notes (Signed)
° °  PRENATAL VISIT NOTE  Subjective:  Candice White is a 31 y.o. G3P2002 at [redacted]w[redacted]d being seen today for ongoing prenatal care.  She is currently monitored for the following issues for this low-risk pregnancy and has Migraine headache; Depression; Generalized anxiety disorder; Supervision of other normal pregnancy, antepartum; and Anemia of pregnancy in third trimester on their problem list.  Patient reports occasional contractions.  Contractions: Irritability. Vag. Bleeding: None.  Movement: Present. Denies leaking of fluid.   The following portions of the patient's history were reviewed and updated as appropriate: allergies, current medications, past family history, past medical history, past social history, past surgical history and problem list.   Objective:   Vitals:   11/21/21 1523  BP: (!) 105/56  Pulse: 75  Weight: 131 lb (59.4 kg)    Fetal Status: Fetal Heart Rate (bpm): 136 Fundal Height: 37 cm Movement: Present  Presentation: Vertex  General:  Alert, oriented and cooperative. Patient is in no acute distress.  Skin: Skin is warm and dry. No rash noted.   Cardiovascular: Normal heart rate noted  Respiratory: Normal respiratory effort, no problems with respiration noted  Abdomen: Soft, gravid, appropriate for gestational age.  Pain/Pressure: Present     Pelvic: Cervical exam performed in the presence of a chaperone Dilation: 2 Effacement (%): 50 Station: -2  Extremities: Normal range of motion.  Edema: None  Mental Status: Normal mood and affect. Normal behavior. Normal judgment and thought content.   Assessment and Plan:  Pregnancy: G3P2002 at [redacted]w[redacted]d 1. Supervision of other normal pregnancy, antepartum --Anticipatory guidance about next visits/weeks of pregnancy given.  --Cervix checked at pt request today, 2/50/-2, posterior, soft --Next appt in 1 week --Labor readiness/labor precautions reviewed  2. [redacted] weeks gestation of pregnancy   Term labor symptoms and general  obstetric precautions including but not limited to vaginal bleeding, contractions, leaking of fluid and fetal movement were reviewed in detail with the patient. Please refer to After Visit Summary for other counseling recommendations.   Return in about 1 week (around 11/28/2021).  Future Appointments  Date Time Provider Department Center  11/29/2021  4:10 PM Nugent, Odie Sera, NP [facility] None    Sharen Counter, CNM

## 2021-11-29 ENCOUNTER — Other Ambulatory Visit: Payer: Self-pay | Admitting: Women's Health

## 2021-11-29 ENCOUNTER — Other Ambulatory Visit: Payer: Self-pay

## 2021-11-29 ENCOUNTER — Ambulatory Visit (INDEPENDENT_AMBULATORY_CARE_PROVIDER_SITE_OTHER): Payer: Medicaid Other | Admitting: Women's Health

## 2021-11-29 VITALS — BP 115/77 | HR 83 | Wt 131.6 lb

## 2021-11-29 DIAGNOSIS — F320 Major depressive disorder, single episode, mild: Secondary | ICD-10-CM

## 2021-11-29 DIAGNOSIS — Z3480 Encounter for supervision of other normal pregnancy, unspecified trimester: Secondary | ICD-10-CM

## 2021-11-29 DIAGNOSIS — F411 Generalized anxiety disorder: Secondary | ICD-10-CM

## 2021-11-29 DIAGNOSIS — Z3A39 39 weeks gestation of pregnancy: Secondary | ICD-10-CM

## 2021-11-29 DIAGNOSIS — O99013 Anemia complicating pregnancy, third trimester: Secondary | ICD-10-CM

## 2021-11-29 NOTE — Progress Notes (Signed)
Pt presents for ROB visit. She does not have any concerns today. ?

## 2021-11-29 NOTE — Progress Notes (Signed)
Admission orders for IOL. 

## 2021-11-29 NOTE — Patient Instructions (Addendum)
Maternity Assessment Unit (MAU) ? ?The Maternity Assessment Unit (MAU) is located at the [facility] at [facility]. The address is: [address] [place], [place], [place] [address]. Please see map below for additional directions. ? ? ? ?The Maternity Assessment Unit is designed to help you during your pregnancy, and for up to 6 weeks after delivery, with any pregnancy- or postpartum-related emergencies, if you think you are in labor, or if your water has broken. For example, if you experience nausea and vomiting, vaginal bleeding, severe abdominal or pelvic pain, elevated blood pressure or other problems related to your pregnancy or postpartum time, please come to the Maternity Assessment Unit for assistance. ? ? ? ? ? ? ?Things to Try After 37 weeks to Encourage Labor/Get Ready for Labor:  ? ? Try the [employer] at https://glass.com/.com daily to improve baby's position and encourage the onset of labor. ? ?Walk a little and rest a little every day.  Change positions often. ? ?Cervical Ripening: May try one or both ?Red Raspberry Leaf capsules or tea:  two 300mg  or 400mg  tablets with each meal, 2-3 times a day, or 1-3 cups of tea daily  ?Potential Side Effects Of Raspberry Leaf:  ?Most women do not experience any side effects from drinking raspberry leaf tea. However, nausea and loose stools are possible  ? ?Evening Primrose Oil capsules: take 1 capsule by mouth and place one capsule in the vagina every night.    ?Some of the potential side effects:  ?Upset stomach  ?Loose stools or diarrhea  ?Headaches  ?Nausea ? ?Sex can also help the cervix ripen and encourage labor onset. ? ? ? ? ? ? ? ?New Induction of Labor Process for [facility] ? ?In Fall 2020 [facility] changed it's process for scheduling inductions of labor to create more induction slots and to make sure patients get COVID-19 testing in advance. After you  have been tested you need to quarantine so that you do not get infected after your test. You should not go anywhere after your test except necessary medical appointments. ? ?You have been scheduled for induction of labor on 12/10/2021. Although you may have a specific time listed on your After Visit Summary or MyChart, we cannot predict when your room will be available. Please disregard this time. A Labor and Delivery staff member will call you on the day that you are scheduled when your room is available. You will need to arrive within one hour of being called. If you do not arrive within this time frame, the next person on the list will be called in and you will move down the list. You may eat a light meal before coming to the hospital. ?If you go into labor, think your water has broken, experience bright red bleeding or don't feel your baby moving as much as usual before your induction, please call your Ob/Gyn's office or come to Entrance C, Maternity Assessment Unit for evaluation. ? ?Thank you, ? ?Center for [facility]  ?

## 2021-11-29 NOTE — Progress Notes (Signed)
Subjective:  ?Candice White is a 31 y.o. G3P2002 at [redacted]w[redacted]d being seen today for ongoing prenatal care.  She is currently monitored for the following issues for this low-risk pregnancy and has Migraine headache; Depression; Generalized anxiety disorder; Supervision of other normal pregnancy, antepartum; and Anemia of pregnancy in third trimester on their problem list. ? ?Patient reports no complaints.  Contractions: Not present. Vag. Bleeding: None.  Movement: Present. Denies leaking of fluid.  ? ?The following portions of the patient's history were reviewed and updated as appropriate: allergies, current medications, past family history, past medical history, past social history, past surgical history and problem list. Problem list updated. ? ?Objective:  ? ?Vitals:  ? 11/29/21 1617  ?BP: 115/77  ?Pulse: 83  ?Weight: 131 lb 9.6 oz (59.7 kg)  ? ? ?Fetal Status: Fetal Heart Rate (bpm): 140   Movement: Present    ? ?General:  Alert, oriented and cooperative. Patient is in no acute distress.  ?Skin: Skin is warm and dry. No rash noted.   ?Cardiovascular: Normal heart rate noted  ?Respiratory: Normal respiratory effort, no problems with respiration noted  ?Abdomen: Soft, gravid, appropriate for gestational age. Pain/Pressure: Present     ?Pelvic: Vag. Bleeding: None     ?Cervical exam performed Dilation: 1 Effacement (%): Thick Station: Ballotable  ?Extremities: Normal range of motion.  Edema: None  ?Mental Status: Normal mood and affect. Normal behavior. Normal judgment and thought content.  ? ?Urinalysis:     ? ?Assessment and Plan:  ?Pregnancy: [id number] at [redacted]w[redacted]d ? ?1. Supervision of other normal pregnancy, antepartum ?-induction scheduled 41 weeks, orders entered ?--Reviewed labor readiness with patient including the [facility] Circuit, evening primrose oil, and raspberry leaf tea.  ? ?2. Generalized anxiety disorder ?GAD 7 : Generalized Anxiety Score 11/05/2021 09/11/2021 04/20/2021  ?Nervous, Anxious, on Edge 0 2 1   ?Control/stop worrying 1 2 0  ?Worry too much - different things 1 2 1   ?Trouble relaxing 1 2 1   ?Restless 1 2 1   ?Easily annoyed or irritable 1 2 3   ?Afraid - awful might happen 1 1 1   ?Total GAD 7 Score 6 13 8   ? ?3. Current mild episode of major depressive disorder, unspecified whether recurrent (HCC) ?PHQ9 SCORE ONLY 11/05/2021 09/11/2021 04/20/2021  ?PHQ-9 Total Score 6 9 4   ? ?4. [redacted] weeks gestation of pregnancy ? ?5. Anemia of pregnancy in third trimester ?-pt not on oral iron, wishes to recheck CBC today ?CBC Latest Ref Rng & Units 09/11/2021 05/10/2021 09/01/2019  ?WBC 3.4 - 10.8 x10E3/uL 11.2(H) 8.6 6.2  ?Hemoglobin 11.1 - 15.9 g/dL 10.1(L) 12.1 12.2  ?Hematocrit 34.0 - 46.6 % 30.7(L) 36.8 36.8  ?Platelets 150 - 450 x10E3/uL 335 451(H) 342  ? ? ?Term labor symptoms and general obstetric precautions including but not limited to vaginal bleeding, contractions, leaking of fluid and fetal movement were reviewed in detail with the patient. ?I discussed the assessment and treatment plan with the patient. The patient was provided an opportunity to ask questions and all were answered. The patient agreed with the plan and demonstrated an understanding of the instructions. The patient was advised to call back or seek an in-person office evaluation/go to MAU at [facility] for any urgent or concerning symptoms. ?Please refer to After Visit Summary for other counseling recommendations.  ?Return in 4 days (on 12/03/2021) for LOB/NST/APP OK, can do 3/6 or 3/7. ? ? ?Duaine Radin, , NP ?

## 2021-11-30 LAB — CBC
Hematocrit: 30.7 % — ABNORMAL LOW (ref 34.0–46.6)
Hemoglobin: 9.7 g/dL — ABNORMAL LOW (ref 11.1–15.9)
MCH: 24.7 pg — ABNORMAL LOW (ref 26.6–33.0)
MCHC: 31.6 g/dL (ref 31.5–35.7)
MCV: 78 fL — ABNORMAL LOW (ref 79–97)
Platelets: 361 10*3/uL (ref 150–450)
RBC: 3.92 x10E6/uL (ref 3.77–5.28)
RDW: 13.3 % (ref 11.7–15.4)
WBC: 11.7 10*3/uL — ABNORMAL HIGH (ref 3.4–10.8)

## 2021-12-02 ENCOUNTER — Other Ambulatory Visit: Payer: Self-pay | Admitting: Family Medicine

## 2021-12-03 ENCOUNTER — Encounter (HOSPITAL_COMMUNITY): Payer: Self-pay | Admitting: *Deleted

## 2021-12-03 ENCOUNTER — Telehealth (HOSPITAL_COMMUNITY): Payer: Self-pay | Admitting: *Deleted

## 2021-12-03 NOTE — Telephone Encounter (Signed)
Preadmission screen  

## 2021-12-04 ENCOUNTER — Encounter: Payer: Medicaid Other | Admitting: Obstetrics & Gynecology

## 2021-12-05 ENCOUNTER — Other Ambulatory Visit: Payer: Self-pay

## 2021-12-05 ENCOUNTER — Ambulatory Visit (INDEPENDENT_AMBULATORY_CARE_PROVIDER_SITE_OTHER): Payer: Medicaid Other

## 2021-12-05 VITALS — BP 107/73 | HR 80 | Wt 134.0 lb

## 2021-12-05 DIAGNOSIS — Z3A40 40 weeks gestation of pregnancy: Secondary | ICD-10-CM

## 2021-12-05 DIAGNOSIS — Z3480 Encounter for supervision of other normal pregnancy, unspecified trimester: Secondary | ICD-10-CM

## 2021-12-05 NOTE — Progress Notes (Signed)
Patient presents for ROB.  ?Reports noticing increased discharge and mucus when using restroom. No complaints or concerns today.  ?Induction scheduled 3/13. ?

## 2021-12-05 NOTE — Progress Notes (Signed)
? ?  LOW-RISK PREGNANCY OFFICE VISIT ? ?Patient name: Candice White MRN [id number]  Date of birth: [birth year] ?Chief Complaint:   ?Routine Prenatal Visit ? ?Subjective:   ?Candice White is a 31 y.o. G8P2002 female at [redacted]w[redacted]d with an Estimated Date of Delivery: 12/03/21 being seen today for ongoing management of a low-risk pregnancy aeb has Migraine headache; Depression; Generalized anxiety disorder; Supervision of other normal pregnancy, antepartum; and Anemia of pregnancy in third trimester on their problem list. ? ?Patient presents today, alone, with no complaints.  Patient endorses fetal movement. Patient reports occasional abdominal cramping or contractions.  Patient reports some occasional vaginal discharge in the form of mucous. Patient denies other vaginal concerns including abnormal leaking of fluid and bleeding.  ? ?She denies constipation, diarrhea, or issues with urination. ? ?She reports the desire to have an un-medicated birth and desires to have postplacental Paragard IUD. ? ? Contractions: Not present. Vag. Bleeding: None.  Movement: Present. ? ?Reviewed past medical,surgical, social, obstetrical and family history as well as problem list, medications and allergies. ? ?Objective  ? ?Vitals:  ? 12/05/21 0814  ?BP: 107/73  ?Pulse: 80  ?Weight: 134 lb (60.8 kg)  ?Body mass index is 23.74 kg/m?.  ?Total Weight Gain:31 lb (14.1 kg) ? ?  ?     Physical Examination:  ? General appearance: Well appearing, and in no distress ? Mental status: Alert, oriented to person, place, and time ? Skin: Warm & dry ? Cardiovascular: Normal heart rate noted ? Respiratory: Normal respiratory effort, no distress ? Abdomen: Soft, gravid, nontender, AGA with Fundal Height: 39 cm ? Pelvic: Cervical exam deferred          ? Extremities: Edema: None ? ? ?Fetal Status: Fetal Heart Rate (bpm): 125  Movement: Present  ? ?No results found for this or any previous visit (from the past 24 hour(s)).  ?Assessment & Plan:  ?Low-risk pregnancy of  a 31 y.o., G3P2002 at [redacted]w[redacted]d with an Estimated Date of Delivery: 12/03/21  ? ?1. Supervision of other normal pregnancy, antepartum ?-Anticipatory guidance for upcoming appts. ?-Patient to schedule next appt in 4-5 weeks for a PP visit. ? ? ?2. [redacted] weeks gestation of pregnancy ?-Doing well. ?-Discussed labor plans. ?-Reassured normal to have increased discharge towards end of pregnancy. ?-Encouraged to monitor and report any leakage.  ?-NST reactive ?  ?Meds: No orders of the defined types were placed in this encounter. ? ?Labs/procedures today:  ?Lab Orders  ?No laboratory test(s) ordered today  ?  ? ?Reviewed: Term labor symptoms and general obstetric precautions including but not limited to vaginal bleeding, contractions, leaking of fluid and fetal movement were reviewed in detail with the patient.  All questions were answered. ? ?Follow-up: Return in about 6 weeks (around 01/16/2022) for Postpartum. ? ?No orders of the defined types were placed in this encounter. ? ?Cherre Robins MSN, CNM ?12/05/2021 ? ?

## 2021-12-06 ENCOUNTER — Encounter: Payer: Medicaid Other | Admitting: Family Medicine

## 2021-12-07 ENCOUNTER — Other Ambulatory Visit (HOSPITAL_COMMUNITY): Payer: Self-pay | Admitting: *Deleted

## 2021-12-07 ENCOUNTER — Other Ambulatory Visit: Payer: Self-pay | Admitting: Family Medicine

## 2021-12-07 DIAGNOSIS — Z3480 Encounter for supervision of other normal pregnancy, unspecified trimester: Secondary | ICD-10-CM

## 2021-12-07 LAB — SARS CORONAVIRUS 2 (TAT 6-24 HRS): SARS Coronavirus 2: NEGATIVE

## 2021-12-08 ENCOUNTER — Encounter (HOSPITAL_COMMUNITY): Payer: Self-pay | Admitting: Family Medicine

## 2021-12-08 ENCOUNTER — Other Ambulatory Visit: Payer: Self-pay

## 2021-12-08 ENCOUNTER — Inpatient Hospital Stay (HOSPITAL_COMMUNITY): Payer: Medicaid Other

## 2021-12-08 ENCOUNTER — Inpatient Hospital Stay (HOSPITAL_COMMUNITY)
Admission: AD | Admit: 2021-12-08 | Discharge: 2021-12-09 | DRG: 807 | Disposition: A | Discharge status: Home / Self Care | Payer: Medicaid Other | Attending: Obstetrics & Gynecology | Admitting: Obstetrics & Gynecology

## 2021-12-08 DIAGNOSIS — Z3A40 40 weeks gestation of pregnancy: Secondary | ICD-10-CM

## 2021-12-08 DIAGNOSIS — O26893 Other specified pregnancy related conditions, third trimester: Secondary | ICD-10-CM | POA: Diagnosis present

## 2021-12-08 DIAGNOSIS — Z87891 Personal history of nicotine dependence: Secondary | ICD-10-CM | POA: Diagnosis not present

## 2021-12-08 DIAGNOSIS — Z3490 Encounter for supervision of normal pregnancy, unspecified, unspecified trimester: Secondary | ICD-10-CM

## 2021-12-08 DIAGNOSIS — D509 Iron deficiency anemia, unspecified: Secondary | ICD-10-CM | POA: Diagnosis present

## 2021-12-08 DIAGNOSIS — Z3480 Encounter for supervision of other normal pregnancy, unspecified trimester: Secondary | ICD-10-CM

## 2021-12-08 DIAGNOSIS — O99013 Anemia complicating pregnancy, third trimester: Secondary | ICD-10-CM | POA: Diagnosis present

## 2021-12-08 DIAGNOSIS — O480 Post-term pregnancy: Principal | ICD-10-CM | POA: Diagnosis present

## 2021-12-08 DIAGNOSIS — O9902 Anemia complicating childbirth: Secondary | ICD-10-CM | POA: Diagnosis present

## 2021-12-08 DIAGNOSIS — Z3A41 41 weeks gestation of pregnancy: Principal | ICD-10-CM | POA: Diagnosis present

## 2021-12-08 LAB — CBC
HCT: 32.9 % — ABNORMAL LOW (ref 36.0–46.0)
Hemoglobin: 9.9 g/dL — ABNORMAL LOW (ref 12.0–15.0)
MCH: 24.6 pg — ABNORMAL LOW (ref 26.0–34.0)
MCHC: 30.1 g/dL (ref 30.0–36.0)
MCV: 81.8 fL (ref 80.0–100.0)
Platelets: 331 10*3/uL (ref 150–400)
RBC: 4.02 MIL/uL (ref 3.87–5.11)
RDW: 14.8 % (ref 11.5–15.5)
WBC: 11.6 10*3/uL — ABNORMAL HIGH (ref 4.0–10.5)
nRBC: 0 % (ref 0.0–0.2)

## 2021-12-08 LAB — TYPE AND SCREEN
ABO/RH(D): B POS
Antibody Screen: NEGATIVE

## 2021-12-08 LAB — RPR: RPR Ser Ql: NONREACTIVE

## 2021-12-08 MED ORDER — MISOPROSTOL 25 MCG QUARTER TABLET: 25.0000 ug | ORAL_TABLET | ORAL | Status: DC | PRN

## 2021-12-08 MED ORDER — ZOLPIDEM TARTRATE 5 MG PO TABS: 5.0000 mg | ORAL_TABLET | Freq: Every evening | ORAL | Status: DC | PRN

## 2021-12-08 MED ORDER — OXYCODONE-ACETAMINOPHEN 5-325 MG PO TABS: 1.0000 | ORAL_TABLET | ORAL | Status: DC | PRN

## 2021-12-08 MED ORDER — SENNOSIDES-DOCUSATE SODIUM 8.6-50 MG PO TABS: 2.0000 | ORAL_TABLET | Freq: Every day | ORAL | Status: DC

## 2021-12-08 MED ORDER — OXYTOCIN BOLUS FROM INFUSION
333.0000 mL | Freq: Once | INTRAVENOUS | Status: AC
  Administered 2021-12-08: 333 mL via INTRAVENOUS

## 2021-12-08 MED ORDER — OXYTOCIN-SODIUM CHLORIDE 30-0.9 UT/500ML-% IV SOLN
2.5000 [IU]/h | INTRAVENOUS | Status: DC
  Filled 2021-12-08: qty 500

## 2021-12-08 MED ORDER — SIMETHICONE 80 MG PO CHEW: 80.0000 mg | CHEWABLE_TABLET | ORAL | Status: DC | PRN

## 2021-12-08 MED ORDER — ONDANSETRON HCL 4 MG/2ML IJ SOLN: 4.0000 mg | INTRAMUSCULAR | Status: DC | PRN

## 2021-12-08 MED ORDER — IBUPROFEN 600 MG PO TABS
600.0000 mg | ORAL_TABLET | Freq: Four times a day (QID) | ORAL | Status: DC
  Administered 2021-12-09: 600 mg via ORAL
  Filled 2021-12-08 (×3): qty 1

## 2021-12-08 MED ORDER — BENZOCAINE-MENTHOL 20-0.5 % EX AERO: 1.0000 "application " | INHALATION_SPRAY | CUTANEOUS | Status: DC | PRN

## 2021-12-08 MED ORDER — DIBUCAINE (PERIANAL) 1 % EX OINT: 1.0000 "application " | TOPICAL_OINTMENT | CUTANEOUS | Status: DC | PRN

## 2021-12-08 MED ORDER — OXYCODONE-ACETAMINOPHEN 5-325 MG PO TABS: 2.0000 | ORAL_TABLET | ORAL | Status: DC | PRN

## 2021-12-08 MED ORDER — TRANEXAMIC ACID-NACL 1000-0.7 MG/100ML-% IV SOLN
INTRAVENOUS | Status: AC
  Administered 2021-12-08: 1000 mg
  Filled 2021-12-08: qty 100

## 2021-12-08 MED ORDER — ONDANSETRON HCL 4 MG PO TABS: 4.0000 mg | ORAL_TABLET | ORAL | Status: DC | PRN

## 2021-12-08 MED ORDER — ACETAMINOPHEN 325 MG PO TABS: 650.0000 mg | ORAL_TABLET | ORAL | Status: DC | PRN

## 2021-12-08 MED ORDER — TRANEXAMIC ACID-NACL 1000-0.7 MG/100ML-% IV SOLN: 1000.0000 mg | INTRAVENOUS | Status: DC

## 2021-12-08 MED ORDER — TERBUTALINE SULFATE 1 MG/ML IJ SOLN: 0.2500 mg | Freq: Once | INTRAMUSCULAR | Status: DC | PRN

## 2021-12-08 MED ORDER — ACETAMINOPHEN 325 MG PO TABS
650.0000 mg | ORAL_TABLET | ORAL | Status: DC | PRN
Start: 2021-12-08 — End: 2021-12-08

## 2021-12-08 MED ORDER — TETANUS-DIPHTH-ACELL PERTUSSIS 5-2.5-18.5 LF-MCG/0.5 IM SUSY: 0.5000 mL | PREFILLED_SYRINGE | Freq: Once | INTRAMUSCULAR | Status: DC

## 2021-12-08 MED ORDER — DIPHENHYDRAMINE HCL 25 MG PO CAPS: 25.0000 mg | ORAL_CAPSULE | Freq: Four times a day (QID) | ORAL | Status: DC | PRN

## 2021-12-08 MED ORDER — COCONUT OIL OIL
1.0000 | TOPICAL_OIL | Status: DC | PRN
Start: 2021-12-08 — End: 2021-12-09

## 2021-12-08 MED ORDER — PRENATAL MULTIVITAMIN CH: 1.0000 | ORAL_TABLET | Freq: Every day | ORAL | Status: DC

## 2021-12-08 MED ORDER — LACTATED RINGERS IV SOLN: INTRAVENOUS | Status: DC

## 2021-12-08 MED ORDER — SOD CITRATE-CITRIC ACID 500-334 MG/5ML PO SOLN
30.0000 mL | ORAL | Status: DC | PRN
Start: 2021-12-08 — End: 2021-12-08

## 2021-12-08 MED ORDER — LIDOCAINE HCL (PF) 1 % IJ SOLN: 30.0000 mL | INTRAMUSCULAR | Status: DC | PRN

## 2021-12-08 MED ORDER — ONDANSETRON HCL 4 MG/2ML IJ SOLN: 4.0000 mg | Freq: Four times a day (QID) | INTRAMUSCULAR | Status: DC | PRN

## 2021-12-08 MED ORDER — WITCH HAZEL-GLYCERIN EX PADS: 1.0000 "application " | MEDICATED_PAD | CUTANEOUS | Status: DC | PRN

## 2021-12-08 MED ORDER — LACTATED RINGERS IV SOLN: 500.0000 mL | INTRAVENOUS | Status: DC | PRN

## 2021-12-08 NOTE — H&P (Signed)
OBSTETRIC ADMISSION HISTORY AND PHYSICAL ? ?Candice White is a 31 y.o. female G6P2002 with IUP at [redacted]w[redacted]d by 7 week ultrasound presenting for spontaneous onset of labor. She reports +FMs, No LOF, no VB, no blurry vision, headaches or peripheral edema, and RUQ pain.  She plans on breast feeding. She request IUD for birth control. ?She received her prenatal care at  [facility]   ? ?Dating: By 7 week ultrasound --->  Estimated Date of Delivery: 12/03/21 ? ? ?Nursing Staff Provider  ?Office Location  [facility] Dating  7w [place]  ?Language   ENGLISH Anatomy US   normal  ?Flu Vaccine  Declined 07/03/2021 Genetic/Carrier Screen  NIPS: neg ?AFP:   negative ?Horizon: neg  ?TDaP Vaccine   declined 10/22/21 Hgb A1C or  ?GTT Early  ?Third trimester  ?Glucose, Fasting 70 - 91 mg/dL 79   ?Glucose, 1 hour 70 - 179 mg/dL 025   ?Glucose, 2 hour 70 - 152 mg/dL 852   ?  ?COVID Vaccine Not vaccinated   LAB RESULTS   ?Rhogam  B+ Blood Type B/Positive/-- (08/11 1353)   ?Baby Feeding Plan Breast Antibody Negative (08/11 1353)  ?Contraception IUD Rubella 7.48 (08/11 1353)  ?Circumcision Yes of a boy RPR Non Reactive (12/13 0959)   ?Pediatrician  [facility] HBsAg Negative (08/11 1353)   ?Support Person FOB HCVAb Negative  ?Prenatal Classes Info given HIV Non Reactive (12/13 0959)     ?BTL Consent  GBS  negative  ?VBAC Consent n/a Pap 08/2020 - NILM  ?     ?DME Rx [*] BP cuff ?[*] Weight Scale Waterbirth  [ ]  Class [ ]  Consent [ ]  CNM visit  ?PHQ9 & GAD7 [ X ] new OB - 4/8 ?[ X ] 28 weeks  ?[ X ] 36 weeks - 6/6 Induction  [ ]  Orders Entered [ ] Foley Y/N  ? ?Prenatal History/Complications: anemia ? ?Past Medical History: ?Past Medical History:  ?Diagnosis Date  ? BV (bacterial vaginosis)   ? Migraine   ? chronic ha   ? PID (acute pelvic inflammatory disease)   ? Prolonged spontaneous rupture of membranes 08/08/2018  ? ? ?Past Surgical History: ?Past Surgical History:  ?Procedure Laterality Date  ? NO PAST SURGERIES    ? ? ?Obstetrical  History: ?OB History   ? ? Gravida  ?3  ? Para  ?2  ? Term  ?2  ? Preterm  ?   ? AB  ?   ? Living  ?2  ?  ? ? SAB  ?   ? IAB  ?   ? Ectopic  ?   ? Multiple  ?0  ? Live Births  ?2  ?   ?  ?  ? ? ?Social History ?Social History  ? ?Socioeconomic History  ? Marital status: Single  ?  Spouse name: Not on file  ? Number of children: Not on file  ? Years of education: Not on file  ? Highest education level: Not on file  ?Occupational History  ? Not on file  ?Tobacco Use  ? Smoking status: Former  ?  Years: 3.00  ?  Types: Cigarettes  ?  Quit date: 12/08/2012  ?  Years since quitting: 9.0  ? Smokeless tobacco: Former  ?  Quit date: 10/02/2014  ?Vaping Use  ? Vaping Use: Never used  ?Substance and Sexual Activity  ? Alcohol use: Not Currently  ?  Comment: in past  ? Drug use: Not Currently  ?  Types: Marijuana  ?  Comment: last smoked was when found out was pregnant with current pregnancy   ? Sexual activity: Not Currently  ?  Partners: Male  ?  Birth control/protection: None  ?Other Topics Concern  ? Not on file  ?Social History Narrative  ? Not on file  ? ?Social Determinants of Health  ? ?Financial Resource Strain: Not on file  ?Food Insecurity: Not on file  ?Transportation Needs: Not on file  ?Physical Activity: Not on file  ?Stress: Not on file  ?Social Connections: Not on file  ? ? ?Family History: ?Family History  ?Problem Relation Age of Onset  ? Hypertension Mother   ? Vision loss Father   ? Cancer Maternal Aunt   ?     lung cancer  ? Cancer Cousin   ?     lung  ? ? ?Allergies: ?No Known Allergies ? ?Medications Prior to Admission  ?Medication Sig Dispense Refill Last Dose  ? ferrous sulfate (FERROUSUL) 325 (65 FE) MG tablet Take 1 tablet (325 mg total) by mouth every other day. (Patient not taking: Reported on 11/12/2021) 30 tablet 3   ? Prenatal Vit-Fe Phos-FA-Omega (VITAFOL GUMMIES) 3.33-0.333-34.8 MG CHEW TAKE 3 TABLETS ONCE DAILY (Patient not taking: Reported on 11/12/2021) 90 tablet 11   ? ?Review of Systems   ? ?All systems reviewed and negative except as stated in HPI ? ?Blood pressure 117/68, pulse 80, temperature 98.2 ?F (36.8 ?C), temperature source Oral, resp. rate 16, height 5\' 3"  (1.6 m), weight 59.1 kg, last menstrual period 02/16/2021, SpO2 99 %, unknown if currently breastfeeding. ?General appearance: alert, cooperative, and no distress ?Lungs: clear to auscultation bilaterally ?Heart: regular rate and rhythm ?Abdomen: soft, non-tender; bowel sounds normal ?Pelvic: n/a ?Extremities: Homans sign is negative, no sign of DVT ?DTR's +2 ?Presentation: cephalic ?Fetal monitoringBaseline: 120 bpm, Variability: Good {> 6 bpm), Accelerations: Reactive, and Decelerations: Absent ?Uterine activity: 3-5 ?Dilation: 7.5 ?Effacement (%): 80 ?Station: -2 ?Exam by:: Marvel PlanJessica Hashem RN ? ? ?Prenatal labs: ?ABO, Rh: --/--/B POS (03/11 0818) ?Antibody: NEG (03/11 0818) ?Rubella: 7.48 (08/11 1353) ?RPR: Non Reactive (12/13 0959)  ?HBsAg: Negative (08/11 1353)  ?HIV: Non Reactive (12/13 0959)  ?GBS: Negative/-- (02/06 1448)  ? ?Prenatal Transfer Tool  ?Maternal Diabetes: No ?Genetic Screening: Normal ?Maternal Ultrasounds/Referrals: Normal ?Fetal Ultrasounds or other Referrals:  None ?Maternal Substance Abuse:  No ?Significant Maternal Medications:  None ?Significant Maternal Lab Results: Group B Strep negative ? ?Results for orders placed or performed during the hospital encounter of 12/08/21 (from the past 24 hour(s))  ?Type and screen  ? Collection Time: 12/08/21  8:18 AM  ?Result Value Ref Range  ? ABO/RH(D) B POS   ? Antibody Screen NEG   ? Sample Expiration    ?  12/11/2021,2359 ?Performed at [facility], [address] [place], [place] [address] ?  ?CBC  ? Collection Time: 12/08/21  8:19 AM  ?Result Value Ref Range  ? WBC 11.6 (H) 4.0 - 10.5 K/uL  ? RBC 4.02 3.87 - 5.11 MIL/uL  ? Hemoglobin 9.9 (L) 12.0 - 15.0 g/dL  ? HCT 32.9 (L) 36.0 - 46.0 %  ? MCV 81.8 80.0 - 100.0 fL  ? MCH 24.6 (L) 26.0 - 34.0 pg  ? MCHC 30.1  30.0 - 36.0 g/dL  ? RDW 14.8 11.5 - 15.5 %  ? Platelets 331 150 - 400 K/uL  ? nRBC 0.0 0.0 - 0.2 %  ? ? ?Patient Active Problem List  ?  Diagnosis Date Noted  ? Post term pregnancy at [redacted] weeks gestation 12/08/2021  ? Anemia of pregnancy in third trimester 09/13/2021  ? Supervision of other normal pregnancy, antepartum 04/13/2021  ? Depression 05/28/2017  ? Generalized anxiety disorder 05/28/2017  ? Migraine headache 05/20/2015  ? ? ?Assessment/Plan:  ?ELLAKATE GONSALVES is a 31 y.o. [id number] at [redacted]w[redacted]d here for spontaneous onset of labor ? ?#Labor: Patient coping well with contractions at this time. Expectant management for now. Will recheck in 2-3 hours if no progress noted. Consider AROM or pitocin at that time if needed.  ? ?#Pain: Per patient request ?#FWB:  Cat 1 ?#ID:  GBS negative ?#MOF: Breast ?#MOC: Considering PP IUD if she has epidural, outpatient if not ?#Circ:  N/a ? ?Rolm Bookbinder, CNM  ?12/08/2021, 9:26 AM ? ? ? ? ?

## 2021-12-08 NOTE — Progress Notes (Signed)
Candice White is a 31 y.o. E7N1700 at [redacted]w[redacted]d by ultrasound admitted for active labor ? ?Subjective: ?[name]  ? ?Objective: ?BP 106/69   Pulse 87   Temp 98.2 ?F (36.8 ?C) (Oral)   Resp 16   Ht 5\' 3"  (1.6 m)   Wt 59.1 kg   LMP 02/16/2021 (Approximate)   SpO2 99%   BMI 23.08 kg/m?  ?No intake/output data recorded. ?No intake/output data recorded. ? ?FHT:  135, 15x15 accels present, no decels present ?UC:   regular, every 3-6 minutes ?SVE:   Dilation: 7.5 ?Effacement (%): 80 ?Station: -2 ?Exam by:: [email or web address] RN ? ?Labs: ?Lab Results  ?Component Value Date  ? WBC 11.6 (H) 12/08/2021  ? HGB 9.9 (L) 12/08/2021  ? HCT 32.9 (L) 12/08/2021  ? MCV 81.8 12/08/2021  ? PLT 331 12/08/2021  ? ? ?Assessment / Plan: ?Spontaneous labor, progressing normally at [redacted]w[redacted]d ? ?Labor: Progressing normally ?Preeclampsia:   n/a ?Fetal Wellbeing:  Category I ?Pain Control:  Labor support without medications, Nitrous Oxide, and epidural PRN ?I/D:  n/a ?Anticipated MOD:  NSVD ? ?[redacted]w[redacted]d, SNM ?12/08/2021, 12:00 PM ? ? ?

## 2021-12-08 NOTE — Progress Notes (Signed)
Candice White is a 31 y.o. V5I4332 at [redacted]w[redacted]d by ultrasound admitted for active labor ? ?Subjective: ?Candice White reports some increasing discomfort with CTXs and intermittent pressure. Fob at bs for support.  ? ?Objective: ?BP 111/75   Pulse 83   Temp 98.3 ?F (36.8 ?C) (Oral)   Resp 16   Ht 5\' 3"  (1.6 m)   Wt 59.1 kg   LMP 02/16/2021 (Approximate)   SpO2 99%   BMI 23.08 kg/m?  ?No intake/output data recorded. ?No intake/output data recorded. ? ?FHT:  135 bpm, 15x15 accels, no decels present ?UC:   regular, every 2-4 minutes ?SVE:   Dilation: 7.5 ?Effacement (%): 70 ?Station: -1 ?Exam by:: [place] neal, cnm ? ?Labs: ?Lab Results  ?Component Value Date  ? WBC 11.6 (H) 12/08/2021  ? HGB 9.9 (L) 12/08/2021  ? HCT 32.9 (L) 12/08/2021  ? MCV 81.8 12/08/2021  ? PLT 331 12/08/2021  ? ? ?Assessment / Plan: ?Spontaneous labor, progressing normally at [redacted]w[redacted]d. Plan to AROM. Pt verbalizes understanding and consents to same. Will reassess and determine need for augmentation at next SVE. ? ?Labor:  progressing normally, AROM now ?Preeclampsia:  n/a ?Fetal Wellbeing:  Category I ?Pain Control:  Labor support without medications, Epidural, and Nitrous Oxide ?I/D:  n/a ?Anticipated MOD:  NSVD ? ?[redacted]w[redacted]d, SNM ?12/08/2021, 12:18 PM ? ? ?

## 2021-12-08 NOTE — Lactation Note (Signed)
This note was copied from a baby's chart. ?Lactation Consultation Note ? ?Patient Name: Candice White ?Today's Date: 12/08/2021 ?  ?Age:30 hours ? ?L&D nurse called to notify LC that infant had already latched in L&D and could be seen on the postpartum floor.  ? ? ?Lurline Hare [place] ?12/08/2021, 2:11 PM ? ? ? ?

## 2021-12-08 NOTE — MAU Note (Signed)
Candice White is a 31 y.o. at [redacted]w[redacted]d here in MAU reporting: having contractions, started at 0500, they are back to back. No bleeding, small trickling noted, has changed twice. +FM ? ?Onset of complaint: 0500 ?Pain score: 7 ?Vitals:  ? 12/08/21 0741  ?BP: 112/79  ?Pulse: 85  ?Resp: 17  ?Temp: 98.1 ?F (36.7 ?C)  ?SpO2: 99%  ?   ?FHT:136 ?Lab orders placed from triage:  none ?

## 2021-12-08 NOTE — Discharge Summary (Signed)
? ?  Postpartum Discharge Summary ? ?Date of Service updated ? ?Patient Name: Candice White ?DOB: Nov 26, 1990 ?MRN: [id number] ? ?Date of admission: 12/08/2021 ?Delivery date:12/08/2021  ?Delivering provider: Woodroe Mode  ?Date of discharge: 12/09/2021 ? ?Admitting diagnosis: Post term pregnancy at [redacted] weeks gestation [O48.0, Z3A.41] ?Intrauterine normal pregnancy [Z34.90] ?Intrauterine pregnancy: [redacted]w[redacted]d    ?Secondary diagnosis:  Principal Problem: ?  Post term pregnancy at [redacted] weeks gestation ?Active Problems: ?  Supervision of other normal pregnancy, antepartum ?  Anemia of pregnancy in third trimester (iron deficiency anemia)  ?  Intrauterine normal pregnancy ? ?Additional problems: None    ?Discharge diagnosis: Term Pregnancy Delivered                                              ?Post partum procedures: None ?Augmentation: AROM ?Complications: None ? ?Hospital course: Onset of Labor With Vaginal Delivery      ?31y.o. yo G3P3003 at 463w5das admitted in Active Labor on 12/08/2021. Patient had an uncomplicated labor course as follows:  ?Membrane Rupture Time/Date: 12:08 PM ,12/08/2021   ?Delivery Method:Vaginal, Spontaneous  ?Episiotomy: None  ?Lacerations:  None  ?Patient had an uncomplicated postpartum course.  She is ambulating, tolerating a regular diet, passing flatus, and urinating well. Her postpartum hemoglobin was 8.4 but asymptomatic. She requested to do oral iron. Patient is discharged home in stable condition on 12/09/21. ? ?Newborn Data: ?Birth date:12/08/2021  ?Birth time:1:11 PM  ?Gender:Female  ?Living status:Living  ?Apgars:9 ,9  ?Weight:3515 g  ? ?Magnesium Sulfate received: No ?BMZ received: No ?Rhophylac:N/A ?MMR:No ?T-DaP: declined ?Flu: No ?Transfusion:No ? ?Physical exam  ?Vitals:  ? 12/08/21 1630 12/08/21 2045 12/09/21 0016 12/09/21 0515  ?BP: 104/73 103/64 112/67 98/64  ?Pulse: 89 91 82 71  ?Resp: '18 18 18 18  ' ?Temp: 98.1 ?F (36.7 ?C) 98 ?F (36.7 ?C) 98 ?F (36.7 ?C) 97.8 ?F (36.6 ?C)   ?TempSrc: Oral Oral Oral Oral  ?SpO2: 100% 100% 99% 100%  ?Weight:      ?Height:      ? ?General: alert, cooperative, and no distress ?Lochia: appropriate ?Uterine Fundus: firm ?Incision: N/A ?DVT Evaluation: No significant calf/ankle edema. ?Labs: ?Lab Results  ?Component Value Date  ? WBC 16.3 (H) 12/09/2021  ? HGB 8.4 (L) 12/09/2021  ? HCT 26.7 (L) 12/09/2021  ? MCV 80.7 12/09/2021  ? PLT 276 12/09/2021  ? ?CMP Latest Ref Rng & Units 01/05/2013  ?Glucose 70 - 99 mg/dL 80  ?BUN 6 - 23 mg/dL 13  ?Creatinine 0.50 - 1.10 mg/dL 0.78  ?Sodium 135 - 145 mEq/L 138  ?Potassium 3.5 - 5.3 mEq/L 3.9  ?Chloride 96 - 112 mEq/L 107  ?CO2 19 - 32 mEq/L 24  ?Calcium 8.4 - 10.5 mg/dL 10.0  ? ?Edinburgh Score: ?Edinburgh Postnatal Depression Scale Screening Tool 12/08/2021  ?I have been able to laugh and see the funny side of things. (No Data)  ?I have looked forward with enjoyment to things. -  ?I have blamed myself unnecessarily when things went wrong. -  ?I have been anxious or worried for no good reason. -  ?I have felt scared or panicky for no good reason. -  ?Things have been getting on top of me. -  ?I have been so unhappy that I have had difficulty sleeping. -  ?I have felt sad or miserable. -  ?  I have been so unhappy that I have been crying. -  ?The thought of harming myself has occurred to me. -  ?Edinburgh Postnatal Depression Scale Total -  ? ? ? ?After visit meds:  ?Allergies as of 12/09/2021   ?No Known Allergies ?  ? ?  ?Medication List  ?  ? ?STOP taking these medications   ? ?Vitafol Gummies 3.33-0.333-34.8 MG Chew ?  ? ?  ? ?TAKE these medications   ? ?acetaminophen 325 MG tablet ?Commonly known as: Tylenol ?Take 2 tablets (650 mg total) by mouth every 4 (four) hours as needed (for pain scale < 4). ?  ?ferrous sulfate 325 (65 FE) MG tablet ?Commonly known as: FerrouSul ?Take 1 tablet (325 mg total) by mouth every other day. ?  ?ibuprofen 600 MG tablet ?Commonly known as: ADVIL ?Take 1 tablet (600 mg total) by mouth  every 6 (six) hours. ?  ? ?  ? ? ? ?Discharge home in stable condition ?Infant Feeding: Breast ?Infant Disposition:home with mother ?Discharge instruction: per After Visit Summary and Postpartum booklet. ?Activity: Advance as tolerated. Pelvic rest for 6 weeks.  ?Diet: routine diet ?Future Appointments: ?Future Appointments  ?Date Time Provider [place]  ?01/16/2022  2:30 PM Shelly Bombard, MD [facility] None  ? ?Follow up Visit: ? ? ?Please schedule this patient for a In person postpartum visit in 4 weeks with the following provider: Any provider. ?Additional Postpartum F/U: n/a   ?Low risk pregnancy complicated by:  n/a ?Delivery mode:  Vaginal, Spontaneous  ?Anticipated Birth Control:  outpatient IUD ? ? ?12/09/2021 ?Patriciaann Clan, DO ? ? ? ?

## 2021-12-09 DIAGNOSIS — Z3490 Encounter for supervision of normal pregnancy, unspecified, unspecified trimester: Secondary | ICD-10-CM

## 2021-12-09 LAB — CBC
HCT: 26.7 % — ABNORMAL LOW (ref 36.0–46.0)
Hemoglobin: 8.4 g/dL — ABNORMAL LOW (ref 12.0–15.0)
MCH: 25.4 pg — ABNORMAL LOW (ref 26.0–34.0)
MCHC: 31.5 g/dL (ref 30.0–36.0)
MCV: 80.7 fL (ref 80.0–100.0)
Platelets: 276 10*3/uL (ref 150–400)
RBC: 3.31 MIL/uL — ABNORMAL LOW (ref 3.87–5.11)
RDW: 14.8 % (ref 11.5–15.5)
WBC: 16.3 10*3/uL — ABNORMAL HIGH (ref 4.0–10.5)
nRBC: 0 % (ref 0.0–0.2)

## 2021-12-09 MED ORDER — ACETAMINOPHEN 325 MG PO TABS: 650.0000 mg | ORAL_TABLET | ORAL | 0 refills | Status: DC | PRN

## 2021-12-09 MED ORDER — IBUPROFEN 600 MG PO TABS: 600.0000 mg | ORAL_TABLET | Freq: Four times a day (QID) | ORAL | 0 refills | Status: DC

## 2021-12-09 NOTE — Plan of Care (Signed)
Discharge instructions given with after visit summary, pt receptive.  

## 2021-12-09 NOTE — Progress Notes (Signed)
Candice White was referred for history of depression/anxiety. ?* Referral screened out by Clinical Social Worker because none of the following criteria appear to apply: ?~ History of anxiety/depression during this pregnancy, or of post-partum depression following prior delivery. No concerns noted in prenatal records.  ?~ Diagnosis of anxiety and/or depression within last 3 years. Candice White's anxiety/depression dates back to 2018.  ?OR ?* Candice White's symptoms currently being treated with medication and/or therapy. ?Please contact the Clinical Social Worker if needs arise, by Candice White request, or if Candice White scores greater than 9/yes to question 10 on Edinburgh Postpartum Depression Screen. ? ?Gillian Kluever, LCSW ?Clinical Social Worker ?[facility] ?Cell#: [phone] ?

## 2021-12-09 NOTE — Lactation Note (Signed)
This note was copied from a baby's chart. ?Lactation Consultation Note ? ?Patient Name: Candice White ?Today's Date: 12/09/2021 ?  ?Age:30 hours ? ?[place] visit attempted, but room was dark & quiet. LC to f/u later.  ? ? ?Matthias [place] ?12/09/2021, 7:55 AM ? ? ? ?

## 2021-12-10 ENCOUNTER — Inpatient Hospital Stay (HOSPITAL_COMMUNITY): Payer: Medicaid Other

## 2021-12-18 ENCOUNTER — Telehealth (HOSPITAL_COMMUNITY): Payer: Self-pay | Admitting: *Deleted

## 2021-12-18 NOTE — Telephone Encounter (Signed)
Attempted Hospital Discharge Follow-Up Call.  Left voice mail requesting that patient return RN's phone call.  

## 2022-01-16 ENCOUNTER — Ambulatory Visit: Payer: Medicaid Other | Admitting: Obstetrics

## 2022-02-12 ENCOUNTER — Ambulatory Visit: Payer: Medicaid Other | Admitting: Obstetrics & Gynecology

## 2022-09-30 NOTE — L&D Delivery Note (Addendum)
OB/GYN Faculty Practice Delivery Note  Candice White is a 32 y.o. [phone] s/p SVD at [redacted]w[redacted]d. She was admitted for SROM.   ROM: 4h 22m with clear fluid GBS Status:  NEGATIVE/-- (10/01 2038) Maximum Maternal Temperature: 98.73F  Labor Progress: Initial SVE: 3/50/-3. She then progressed to complete.   Delivery Date/Time: 10/2 @ 1008 Delivery: Called to room and patient was complete and pushing. Head delivered LOA. No nuchal cord present. Shoulder and body delivered in usual fashion. Infant with spontaneous cry, placed on mother's abdomen, dried and stimulated. Cord clamped x 2 after 1-minute delay, and cut by FOB. Cord blood drawn. Placenta delivered spontaneously with gentle cord traction. Fundus firm with massage and Pitocin. Labia, perineum, vagina, and cervix inspected without laceration. Pt had persistent ooze so received TXA as well. Mom and baby doing well.   Baby Weight: pending  Placenta: 3 vessel, intact. Sent to L&D Complications: None Lacerations: none EBL: 427 mL Analgesia: none  Infant:  APGAR (1 MIN): 8  APGAR (5 MINS): 8   Hessie Dibble, MD [facility] Family Medicine Fellow, [facility] for [facility], [facility] 07/02/2023, 10:41 AM

## 2023-01-08 ENCOUNTER — Ambulatory Visit: Payer: Medicaid Other

## 2023-01-13 ENCOUNTER — Other Ambulatory Visit (HOSPITAL_COMMUNITY)
Admission: RE | Admit: 2023-01-13 | Discharge: 2023-01-13 | Disposition: A | Discharge status: Home / Self Care | Payer: Medicaid Other | Source: Ambulatory Visit | Attending: Obstetrics and Gynecology | Admitting: Obstetrics and Gynecology

## 2023-01-13 ENCOUNTER — Ambulatory Visit (INDEPENDENT_AMBULATORY_CARE_PROVIDER_SITE_OTHER): Payer: Medicaid Other

## 2023-01-13 VITALS — BP 105/68 | HR 65 | Ht 63.0 in | Wt 112.0 lb

## 2023-01-13 DIAGNOSIS — Z3480 Encounter for supervision of other normal pregnancy, unspecified trimester: Secondary | ICD-10-CM | POA: Diagnosis not present

## 2023-01-13 DIAGNOSIS — N912 Amenorrhea, unspecified: Secondary | ICD-10-CM

## 2023-01-13 LAB — POCT URINE PREGNANCY: Preg Test, Ur: POSITIVE — AB

## 2023-01-13 MED ORDER — VITAFOL GUMMIES 3.33-0.333-34.8 MG PO CHEW: 3.0000 | CHEWABLE_TABLET | Freq: Every day | ORAL | 11 refills | Status: AC

## 2023-01-13 NOTE — Progress Notes (Signed)
Candice White presents today for UPT. She has no unusual complaints. LMP: 08/14/22 EDD 05/25/23 [redacted]w[redacted]d    OBJECTIVE: Appears well, in no apparent distress.  OB History     Gravida  3   Para  3   Term  3   Preterm      AB      Living  3      SAB      IAB      Ectopic      Multiple  0   Live Births  3          Home UPT Result: Positive In-Office UPT result: Positive I have reviewed the patient's medical, obstetrical, social, and family histories, and medications.   ASSESSMENT: Positive pregnancy test  PLAN Prenatal care to be completed at: [facility] visit Anatomy scan ordered

## 2023-01-14 LAB — CBC/D/PLT+RPR+RH+ABO+RUBIGG...
Antibody Screen: NEGATIVE
Basophils Absolute: 0 10*3/uL (ref 0.0–0.2)
Basos: 1 %
EOS (ABSOLUTE): 0.1 10*3/uL (ref 0.0–0.4)
Eos: 1 %
HCV Ab: NONREACTIVE
HIV Screen 4th Generation wRfx: NONREACTIVE
Hematocrit: 33.1 % — ABNORMAL LOW (ref 34.0–46.6)
Hemoglobin: 10.7 g/dL — ABNORMAL LOW (ref 11.1–15.9)
Hepatitis B Surface Ag: NEGATIVE
Immature Grans (Abs): 0 10*3/uL (ref 0.0–0.1)
Immature Granulocytes: 0 %
Lymphocytes Absolute: 3.3 10*3/uL — ABNORMAL HIGH (ref 0.7–3.1)
Lymphs: 38 %
MCH: 28.1 pg (ref 26.6–33.0)
MCHC: 32.3 g/dL (ref 31.5–35.7)
MCV: 87 fL (ref 79–97)
Monocytes Absolute: 0.6 10*3/uL (ref 0.1–0.9)
Monocytes: 7 %
Neutrophils Absolute: 4.6 10*3/uL (ref 1.4–7.0)
Neutrophils: 53 %
Platelets: 390 10*3/uL (ref 150–450)
RBC: 3.81 x10E6/uL (ref 3.77–5.28)
RDW: 13.2 % (ref 11.7–15.4)
RPR Ser Ql: NONREACTIVE
Rh Factor: POSITIVE
Rubella Antibodies, IGG: 6.62 index (ref >0.99)
WBC: 8.6 10*3/uL (ref 3.4–10.8)

## 2023-01-14 LAB — HCV INTERPRETATION

## 2023-01-15 LAB — MOLECULAR ANCILLARY ONLY
Comment: NEGATIVE
Comment: NEGATIVE
Comment: NORMAL

## 2023-01-15 LAB — URINE CULTURE, OB REFLEX

## 2023-01-15 LAB — CULTURE, OB URINE

## 2023-01-22 LAB — PANORAMA PRENATAL TEST FULL PANEL:PANORAMA TEST PLUS 5 ADDITIONAL MICRODELETIONS: FETAL FRACTION: 8.2

## 2023-01-24 ENCOUNTER — Encounter: Payer: Self-pay | Admitting: Obstetrics

## 2023-01-30 LAB — CBC/D/PLT+RPR+RH+ABO+RUBIGG...

## 2023-01-30 LAB — REFERENCE MICRO PROBLEM TEST

## 2023-02-07 ENCOUNTER — Encounter: Payer: Medicaid Other | Admitting: Obstetrics

## 2023-02-13 ENCOUNTER — Ambulatory Visit: Payer: Medicaid Other | Attending: Obstetrics and Gynecology

## 2023-02-13 ENCOUNTER — Ambulatory Visit: Payer: Medicaid Other

## 2023-02-13 ENCOUNTER — Other Ambulatory Visit: Payer: Self-pay

## 2023-02-13 ENCOUNTER — Other Ambulatory Visit: Payer: Self-pay | Admitting: Obstetrics and Gynecology

## 2023-02-13 VITALS — BP 97/55 | HR 103

## 2023-02-13 DIAGNOSIS — Z363 Encounter for antenatal screening for malformations: Secondary | ICD-10-CM | POA: Insufficient documentation

## 2023-02-13 DIAGNOSIS — Z3480 Encounter for supervision of other normal pregnancy, unspecified trimester: Secondary | ICD-10-CM

## 2023-02-13 DIAGNOSIS — N912 Amenorrhea, unspecified: Secondary | ICD-10-CM

## 2023-02-13 DIAGNOSIS — O4442 Low lying placenta NOS or without hemorrhage, second trimester: Secondary | ICD-10-CM | POA: Insufficient documentation

## 2023-02-13 DIAGNOSIS — Z3A19 19 weeks gestation of pregnancy: Secondary | ICD-10-CM | POA: Diagnosis not present

## 2023-02-13 DIAGNOSIS — Z362 Encounter for other antenatal screening follow-up: Secondary | ICD-10-CM

## 2023-02-13 DIAGNOSIS — Z3687 Encounter for antenatal screening for uncertain dates: Secondary | ICD-10-CM | POA: Diagnosis not present

## 2023-03-20 ENCOUNTER — Ambulatory Visit: Payer: Medicaid Other | Attending: Maternal & Fetal Medicine

## 2023-03-20 DIAGNOSIS — Z362 Encounter for other antenatal screening follow-up: Secondary | ICD-10-CM | POA: Insufficient documentation

## 2023-03-21 ENCOUNTER — Other Ambulatory Visit (HOSPITAL_COMMUNITY)
Admission: RE | Admit: 2023-03-21 | Discharge: 2023-03-21 | Disposition: A | Discharge status: Home / Self Care | Payer: Medicaid Other | Source: Ambulatory Visit | Attending: Obstetrics | Admitting: Obstetrics

## 2023-03-21 ENCOUNTER — Encounter: Payer: Self-pay | Admitting: Obstetrics and Gynecology

## 2023-03-21 ENCOUNTER — Ambulatory Visit (INDEPENDENT_AMBULATORY_CARE_PROVIDER_SITE_OTHER): Payer: Medicaid Other | Admitting: Obstetrics and Gynecology

## 2023-03-21 ENCOUNTER — Other Ambulatory Visit: Payer: Medicaid Other

## 2023-03-21 VITALS — BP 98/63 | HR 75 | Wt 118.0 lb

## 2023-03-21 DIAGNOSIS — Z3A19 19 weeks gestation of pregnancy: Secondary | ICD-10-CM

## 2023-03-21 DIAGNOSIS — Z3480 Encounter for supervision of other normal pregnancy, unspecified trimester: Secondary | ICD-10-CM | POA: Diagnosis present

## 2023-03-21 DIAGNOSIS — Z789 Other specified health status: Secondary | ICD-10-CM | POA: Diagnosis not present

## 2023-03-21 DIAGNOSIS — D509 Iron deficiency anemia, unspecified: Secondary | ICD-10-CM

## 2023-03-21 MED ORDER — FERROUS SULFATE 325 (65 FE) MG PO TABS: 325.0000 mg | ORAL_TABLET | ORAL | 1 refills | Status: AC

## 2023-03-21 NOTE — Patient Instructions (Addendum)
It was nice meeting you today! Next appointment is a big testing appointment. Please make sure you do not eat or drink anything besides water prior to your appointment.

## 2023-03-21 NOTE — Progress Notes (Signed)
Subjective:   Candice White is a 32 y.o. G4P3003 at [redacted]w[redacted]d by [place] at 19 weeks being seen today for her first obstetrical visit.  Her obstetrical history is significant for  none . Pregnancy history fully reviewed. SVDx3, denies complications with prior pregnancies or delivery. Birth weights in history tab.   No complaints  Patient reports no complaints.  HISTORY: OB History  Gravida Para Term Preterm AB Living  4 3 3  0 0 3  SAB IAB Ectopic Multiple Live Births  0 0 0 0 3    # Outcome Date GA Lbr Len/2nd Weight Sex Delivery Anes PTL Lv  4 Current           3 Term 12/08/21 [redacted]w[redacted]d 08:37 / 00:04 7 lb 12 oz (3.515 kg) F Vag-Spont None  LIV     Name: LEILANEE, RIGHETTI     Apgar1: 9  Apgar5: 9  2 Term 08/08/18 [redacted]w[redacted]d  8 lb (3.629 kg) F Vag-Spont   LIV  1 Term 05/25/15 [redacted]w[redacted]d 11:25 / 01:14 7 lb 3.7 oz (3.28 kg) M Vag-Spont EPI  LIV     Name: LAWRENCIA, MAUNEY     Apgar1: 8  Apgar5: 9    Last pap smear: Lab Results  Component Value Date   DIAGPAP  08/30/2020    - Negative for intraepithelial lesion or malignancy (NILM)   HPV NOT DETECTED 05/27/2017   Past Medical History:  Diagnosis Date   BV (bacterial vaginosis)    Migraine    chronic ha    PID (acute pelvic inflammatory disease)    Prolonged spontaneous rupture of membranes 08/08/2018   Past Surgical History:  Procedure Laterality Date   NO PAST SURGERIES     Family History  Problem Relation Age of Onset   Hypertension Mother    Vision loss Father    Cancer Maternal Aunt        lung cancer   Cancer Cousin        lung   Social History   Tobacco Use   Smoking status: Former    Years: 3    Types: Cigarettes    Quit date: 12/08/2012    Years since quitting: 10.2   Smokeless tobacco: Former    Quit date: 10/02/2014  Vaping Use   Vaping Use: Never used  Substance Use Topics   Alcohol use: Yes    Comment: had some wine in the last week   Drug use: Not Currently    Types: Marijuana    Comment: last smoked was when  found out was pregnant with current pregnancy    No Known Allergies Current Outpatient Medications on File Prior to Visit  Medication Sig Dispense Refill   acetaminophen (TYLENOL) 325 MG tablet Take 2 tablets (650 mg total) by mouth every 4 (four) hours as needed (for pain scale < 4). 60 tablet 0   Prenatal Vit-Fe Phos-FA-Omega (VITAFOL GUMMIES) 3.33-0.333-34.8 MG CHEW Chew 3 tablets by mouth daily. 90 tablet 11   No current facility-administered medications on file prior to visit.   Exam   Vitals:   03/21/23 0920  BP: 98/63  Pulse: 75  Weight: 118 lb (53.5 kg)   Fetal Heart Rate (bpm): 150  General:  Alert, oriented and cooperative. Patient is in no acute distress.  Breast: Deferred  Cardiovascular: Normal heart rate noted  Respiratory: Normal respiratory effort, no problems with respiration noted  Abdomen: Soft, gravid, appropriate for gestational age.  Pain/Pressure: Absent  Extremities: Normal range of motion.     Mental Status: Normal mood and affect. Normal behavior. Normal judgment and thought content.    Assessment:   Pregnancy: [id number] Patient Active Problem List   Diagnosis Date Noted   Supervision of other normal pregnancy, antepartum 01/13/2023    Plan:  1. Supervision of other normal pregnancy, antepartum Initial labs previously drawn, reviewed & normal. Dicussed next visit with have 2h GTT with CBC, HIV, RPR & tdap. Reviewed need for fasting. Continue prenatal vitamins. Previously completed NIPS, LR Ultrasound completed & normal. Concern for LLP with initial [place] but repeat normal & growth AGA. Problem list reviewed and updated. The nature of [facility] with multiple MDs and other Advanced Practice Providers was explained to patient; also emphasized that residents, students are part of our team. Routine obstetric precautions reviewed.  2. Alcohol use Reports 1 glass of wine last week. Discussed that there is no known  safe level of alcohol consumption in pregnancy and recommended cessation.  3. Anemia Rx for po iron sent  Return in about 4 weeks (around 04/18/2023) for return OB at 28 weeks with 2h GTT, CBC, HIV, RPR, tdap.  Harvie Bridge, MD Obstetrician & Gynecologist, [facility] for [employer], [facility]

## 2023-03-24 LAB — CERVICOVAGINAL ANCILLARY ONLY
Chlamydia: NEGATIVE
Comment: NEGATIVE
Comment: NORMAL
Neisseria Gonorrhea: NEGATIVE

## 2023-04-18 ENCOUNTER — Encounter: Payer: Medicaid Other | Admitting: Obstetrics and Gynecology

## 2023-04-24 ENCOUNTER — Other Ambulatory Visit: Payer: Medicaid Other

## 2023-04-24 ENCOUNTER — Encounter: Payer: Medicaid Other | Admitting: Obstetrics and Gynecology

## 2023-05-02 ENCOUNTER — Encounter: Payer: Medicaid Other | Admitting: Obstetrics and Gynecology

## 2023-05-15 ENCOUNTER — Other Ambulatory Visit: Payer: Medicaid Other

## 2023-05-15 ENCOUNTER — Ambulatory Visit (INDEPENDENT_AMBULATORY_CARE_PROVIDER_SITE_OTHER): Payer: Medicaid Other | Admitting: Obstetrics and Gynecology

## 2023-05-15 ENCOUNTER — Other Ambulatory Visit (HOSPITAL_COMMUNITY)
Admission: RE | Admit: 2023-05-15 | Discharge: 2023-05-15 | Disposition: A | Discharge status: Home / Self Care | Payer: Medicaid Other | Source: Ambulatory Visit | Attending: Obstetrics and Gynecology | Admitting: Obstetrics and Gynecology

## 2023-05-15 ENCOUNTER — Encounter: Payer: Medicaid Other | Admitting: Obstetrics and Gynecology

## 2023-05-15 ENCOUNTER — Encounter: Payer: Self-pay | Admitting: Obstetrics and Gynecology

## 2023-05-15 VITALS — BP 94/60 | HR 76 | Wt 124.3 lb

## 2023-05-15 DIAGNOSIS — Z3A32 32 weeks gestation of pregnancy: Secondary | ICD-10-CM | POA: Diagnosis not present

## 2023-05-15 DIAGNOSIS — O26893 Other specified pregnancy related conditions, third trimester: Secondary | ICD-10-CM | POA: Insufficient documentation

## 2023-05-15 DIAGNOSIS — N898 Other specified noninflammatory disorders of vagina: Secondary | ICD-10-CM | POA: Insufficient documentation

## 2023-05-15 DIAGNOSIS — Z3480 Encounter for supervision of other normal pregnancy, unspecified trimester: Secondary | ICD-10-CM

## 2023-05-15 DIAGNOSIS — O26899 Other specified pregnancy related conditions, unspecified trimester: Secondary | ICD-10-CM | POA: Insufficient documentation

## 2023-05-15 DIAGNOSIS — O0990 Supervision of high risk pregnancy, unspecified, unspecified trimester: Secondary | ICD-10-CM | POA: Diagnosis not present

## 2023-05-15 MED ORDER — BLOOD PRESSURE KIT DEVI
1.0000 | 0 refills | Status: AC | PRN
Start: 2023-05-15 — End: ?

## 2023-05-15 MED ORDER — GOJJI WEIGHT SCALE MISC
1.0000 | 0 refills | Status: AC | PRN
Start: 2023-05-15 — End: ?

## 2023-05-15 NOTE — Patient Instructions (Signed)

## 2023-05-15 NOTE — Progress Notes (Signed)
Subjective:  Candice White is a 32 y.o. [phone] at [redacted]w[redacted]d being seen today for ongoing prenatal care.  She is currently monitored for the following issues for this low-risk pregnancy and has Supervision of other normal pregnancy, antepartum and Vaginal discharge during pregnancy on their problem list.  Patient reports  general discomforts of pregnancy .  Contractions: Not present. Vag. Bleeding: None.  Movement: Present. Denies leaking of fluid.   The following portions of the patient's history were reviewed and updated as appropriate: allergies, current medications, past family history, past medical history, past social history, past surgical history and problem list. Problem list updated.  Objective:   Vitals:   05/15/23 0923  BP: 94/60  Pulse: 76  Weight: 124 lb 4.8 oz (56.4 kg)    Fetal Status: Fetal Heart Rate (bpm): 145 Fundal Height: 32 cm Movement: Present     General:  Alert, oriented and cooperative. Patient is in no acute distress.  Skin: Skin is warm and dry. No rash noted.   Cardiovascular: Normal heart rate noted  Respiratory: Normal respiratory effort, no problems with respiration noted  Abdomen: Soft, gravid, appropriate for gestational age. Pain/Pressure: Present     Pelvic:  Cervical exam performed        Extremities: Normal range of motion.     Mental Status: Normal mood and affect. Normal behavior. Normal judgment and thought content.   Urinalysis:      Assessment and Plan:  Pregnancy: G4P3003 at [redacted]w[redacted]d  1. Supervision of other normal pregnancy, antepartum Stable - Glucose Tolerance, 2 Hours w/1 Hour - RPR - CBC - HIV antibody (with reflex) - Blood Pressure Monitoring (BLOOD PRESSURE KIT) DEVI; 1 kit by Does not apply route as needed.  Dispense: 1 each; Refill: 0 - Misc. Devices (GOJJI WEIGHT SCALE) MISC; 1 Device by Does not apply route as needed.  Dispense: 1 each; Refill: 0   2. Vaginal discharge during pregnancy in third trimester  - Cervicovaginal  ancillary only( [place])  Preterm labor symptoms and general obstetric precautions including but not limited to vaginal bleeding, contractions, leaking of fluid and fetal movement were reviewed in detail with the patient. Please refer to After Visit Summary for other counseling recommendations.  No follow-ups on file.   Hermina Staggers, MD

## 2023-05-15 NOTE — Progress Notes (Signed)
Pt reports fetal movement with some pressure. Pt would like her cervix checked today.

## 2023-05-16 LAB — CBC
Hematocrit: 26.1 % — ABNORMAL LOW (ref 34.0–46.6)
Hemoglobin: 7.9 g/dL — ABNORMAL LOW (ref 11.1–15.9)
MCH: 24.2 pg — ABNORMAL LOW (ref 26.6–33.0)
MCHC: 30.3 g/dL — ABNORMAL LOW (ref 31.5–35.7)
MCV: 80 fL (ref 79–97)
Platelets: 362 10*3/uL (ref 150–450)
RBC: 3.27 x10E6/uL — ABNORMAL LOW (ref 3.77–5.28)
RDW: 13.6 % (ref 11.7–15.4)
WBC: 8.4 10*3/uL (ref 3.4–10.8)

## 2023-05-16 LAB — CERVICOVAGINAL ANCILLARY ONLY
Bacterial Vaginitis (gardnerella): NEGATIVE
Candida Glabrata: NEGATIVE
Candida Vaginitis: NEGATIVE
Chlamydia: NEGATIVE
Comment: NEGATIVE
Comment: NEGATIVE
Comment: NEGATIVE
Comment: NEGATIVE
Comment: NEGATIVE
Comment: NORMAL
Neisseria Gonorrhea: NEGATIVE
Trichomonas: NEGATIVE

## 2023-05-16 LAB — RPR: RPR Ser Ql: NONREACTIVE

## 2023-05-16 LAB — GLUCOSE TOLERANCE, 2 HOURS W/ 1HR
Glucose, 1 hour: 92 mg/dL (ref 70–179)
Glucose, 2 hour: 80 mg/dL (ref 70–152)
Glucose, Fasting: 76 mg/dL (ref 70–91)

## 2023-05-16 LAB — HIV ANTIBODY (ROUTINE TESTING W REFLEX): HIV Screen 4th Generation wRfx: NONREACTIVE

## 2023-05-21 ENCOUNTER — Other Ambulatory Visit: Payer: Self-pay | Admitting: Obstetrics and Gynecology

## 2023-05-21 ENCOUNTER — Encounter: Payer: Self-pay | Admitting: Obstetrics and Gynecology

## 2023-05-22 ENCOUNTER — Telehealth: Payer: Self-pay

## 2023-05-22 NOTE — Telephone Encounter (Signed)
-----   Message from Hermina Staggers sent at 05/21/2023  2:31 PM EDT ----- Please let pt know that she passed her Glucola test but she is anemic. An Rx for iron supplement has been sent to her pharmacy but I have also placed orders for iron infusion. Thanks Casimiro Needle

## 2023-05-22 NOTE — Telephone Encounter (Signed)
Pt returning my phone call from earlier. Pt informed of labs and recommendation for iron supplement. Pt is not agreeable to iron infusion at this time. Pt would like to start with iron supplements first. Pt informed to increase water intake and add in stool softener when beginning iron.

## 2023-05-30 ENCOUNTER — Encounter: Payer: Medicaid Other | Admitting: Obstetrics & Gynecology

## 2023-06-09 ENCOUNTER — Encounter: Payer: Medicaid Other | Admitting: Obstetrics and Gynecology

## 2023-06-17 ENCOUNTER — Encounter: Payer: Medicaid Other | Admitting: Obstetrics and Gynecology

## 2023-06-24 ENCOUNTER — Encounter: Payer: Medicaid Other | Admitting: Obstetrics & Gynecology

## 2023-07-01 ENCOUNTER — Ambulatory Visit (INDEPENDENT_AMBULATORY_CARE_PROVIDER_SITE_OTHER): Payer: Medicaid Other | Admitting: Family Medicine

## 2023-07-01 ENCOUNTER — Other Ambulatory Visit: Payer: Self-pay

## 2023-07-01 ENCOUNTER — Encounter (HOSPITAL_COMMUNITY): Payer: Self-pay | Admitting: Obstetrics & Gynecology

## 2023-07-01 ENCOUNTER — Inpatient Hospital Stay (HOSPITAL_COMMUNITY)
Admission: AD | Admit: 2023-07-01 | Discharge: 2023-07-03 | DRG: 807 | Disposition: A | Discharge status: Home / Self Care | Payer: Medicaid Other | Attending: Obstetrics and Gynecology | Admitting: Obstetrics and Gynecology

## 2023-07-01 VITALS — BP 98/68 | HR 96 | Wt 128.4 lb

## 2023-07-01 DIAGNOSIS — O4292 Full-term premature rupture of membranes, unspecified as to length of time between rupture and onset of labor: Principal | ICD-10-CM | POA: Diagnosis present

## 2023-07-01 DIAGNOSIS — Z3483 Encounter for supervision of other normal pregnancy, third trimester: Secondary | ICD-10-CM | POA: Diagnosis not present

## 2023-07-01 DIAGNOSIS — D509 Iron deficiency anemia, unspecified: Secondary | ICD-10-CM | POA: Diagnosis not present

## 2023-07-01 DIAGNOSIS — Z87891 Personal history of nicotine dependence: Secondary | ICD-10-CM

## 2023-07-01 DIAGNOSIS — Z821 Family history of blindness and visual loss: Secondary | ICD-10-CM | POA: Diagnosis not present

## 2023-07-01 DIAGNOSIS — O4290 Premature rupture of membranes, unspecified as to length of time between rupture and onset of labor, unspecified weeks of gestation: Secondary | ICD-10-CM | POA: Diagnosis present

## 2023-07-01 DIAGNOSIS — Z801 Family history of malignant neoplasm of trachea, bronchus and lung: Secondary | ICD-10-CM

## 2023-07-01 DIAGNOSIS — Z3A39 39 weeks gestation of pregnancy: Secondary | ICD-10-CM

## 2023-07-01 DIAGNOSIS — Z3480 Encounter for supervision of other normal pregnancy, unspecified trimester: Secondary | ICD-10-CM | POA: Diagnosis not present

## 2023-07-01 DIAGNOSIS — Z8249 Family history of ischemic heart disease and other diseases of the circulatory system: Secondary | ICD-10-CM | POA: Diagnosis not present

## 2023-07-01 DIAGNOSIS — O4202 Full-term premature rupture of membranes, onset of labor within 24 hours of rupture: Secondary | ICD-10-CM | POA: Diagnosis not present

## 2023-07-01 HISTORY — DX: Premature rupture of membranes, unspecified as to length of time between rupture and onset of labor, unspecified weeks of gestation: O42.90

## 2023-07-01 LAB — CBC
HCT: 29.6 % — ABNORMAL LOW (ref 36.0–46.0)
Hemoglobin: 9.1 g/dL — ABNORMAL LOW (ref 12.0–15.0)
MCH: 24.6 pg — ABNORMAL LOW (ref 26.0–34.0)
MCHC: 30.7 g/dL (ref 30.0–36.0)
MCV: 80 fL (ref 80.0–100.0)
Platelets: 328 10*3/uL (ref 150–400)
RBC: 3.7 MIL/uL — ABNORMAL LOW (ref 3.87–5.11)
RDW: 18.3 % — ABNORMAL HIGH (ref 11.5–15.5)
WBC: 10.8 10*3/uL — ABNORMAL HIGH (ref 4.0–10.5)
nRBC: 0.3 % — ABNORMAL HIGH (ref 0.0–0.2)

## 2023-07-01 LAB — TYPE AND SCREEN
ABO/RH(D): B POS
Antibody Screen: NEGATIVE

## 2023-07-01 LAB — RUPTURE OF MEMBRANE (ROM)PLUS: Rom Plus: POSITIVE

## 2023-07-01 LAB — HIV ANTIBODY (ROUTINE TESTING W REFLEX): HIV Screen 4th Generation wRfx: NONREACTIVE

## 2023-07-01 LAB — GROUP B STREP BY PCR: Group B strep by PCR: NEGATIVE

## 2023-07-01 MED ORDER — OXYTOCIN-SODIUM CHLORIDE 30-0.9 UT/500ML-% IV SOLN: 2.5000 [IU]/h | INTRAVENOUS | Status: DC

## 2023-07-01 MED ORDER — TERBUTALINE SULFATE 1 MG/ML IJ SOLN: 0.2500 mg | Freq: Once | INTRAMUSCULAR | Status: DC | PRN

## 2023-07-01 MED ORDER — LACTATED RINGERS IV SOLN: INTRAVENOUS | Status: DC

## 2023-07-01 MED ORDER — OXYTOCIN-SODIUM CHLORIDE 30-0.9 UT/500ML-% IV SOLN
1.0000 m[IU]/min | INTRAVENOUS | Status: DC
  Administered 2023-07-02: 2 m[IU]/min via INTRAVENOUS
  Filled 2023-07-01: qty 500

## 2023-07-01 MED ORDER — OXYCODONE-ACETAMINOPHEN 5-325 MG PO TABS: 2.0000 | ORAL_TABLET | ORAL | Status: DC | PRN

## 2023-07-01 MED ORDER — LIDOCAINE HCL (PF) 1 % IJ SOLN
30.0000 mL | INTRAMUSCULAR | Status: AC | PRN
  Administered 2023-07-02: 30 mL via SUBCUTANEOUS
  Filled 2023-07-01: qty 30

## 2023-07-01 MED ORDER — ACETAMINOPHEN 325 MG PO TABS: 650.0000 mg | ORAL_TABLET | ORAL | Status: DC | PRN

## 2023-07-01 MED ORDER — OXYTOCIN BOLUS FROM INFUSION
333.0000 mL | Freq: Once | INTRAVENOUS | Status: AC
  Administered 2023-07-02: 333 mL via INTRAVENOUS

## 2023-07-01 MED ORDER — LACTATED RINGERS IV SOLN: 500.0000 mL | INTRAVENOUS | Status: DC | PRN

## 2023-07-01 MED ORDER — ONDANSETRON HCL 4 MG/2ML IJ SOLN: 4.0000 mg | Freq: Four times a day (QID) | INTRAMUSCULAR | Status: DC | PRN

## 2023-07-01 MED ORDER — SOD CITRATE-CITRIC ACID 500-334 MG/5ML PO SOLN
30.0000 mL | ORAL | Status: DC | PRN
  Administered 2023-07-01: 30 mL via ORAL
  Filled 2023-07-01: qty 30

## 2023-07-01 MED ORDER — OXYCODONE-ACETAMINOPHEN 5-325 MG PO TABS: 1.0000 | ORAL_TABLET | ORAL | Status: DC | PRN

## 2023-07-01 NOTE — MAU Note (Signed)
.  Candice White is a 32 y.o. at 104w2d here in MAU reporting: Sent over from office. LOF for one week now. She reports she has been leaking intermittently. Reports the fluid is creamy/white. Denies recent IC. Denies pain. +FM.  Onset of complaint: x1 week Pain score: Denies pain.   FHT: 154 initial external Lab orders placed from triage: Crist Fat

## 2023-07-01 NOTE — MAU Provider Note (Deleted)
History     CSN: [id number]  Arrival date and time: 07/01/23 1820   None     Chief Complaint  Patient presents with   Rupture of Membranes   HPI Candice White is a 32 y.o. [phone] at [redacted]w[redacted]d who presents today to MAU from clinic. She was seen for ROB today and reported leaking of fluid x 1 week. Describes her fluid as creamy white discharge, has been leaking constantly. She reports having similar presentation with a previous pregnancy where she had a ?high leak. She denies any vaginal bleeding, regular contractions. Has been having Braxton Hicks contractions, but nothing very uncomfortable. Has good FM. No other complaints or concerns today.  Past Medical History:  Diagnosis Date   BV (bacterial vaginosis)    Migraine    chronic ha    PID (acute pelvic inflammatory disease)     Past Surgical History:  Procedure Laterality Date   NO PAST SURGERIES      Family History  Problem Relation Age of Onset   Hypertension Mother    Vision loss Father    Cancer Maternal Aunt        lung cancer   Cancer Cousin        lung    Social History   Tobacco Use   Smoking status: Former    Current packs/day: 0.00    Types: Cigarettes    Start date: 12/08/2009    Quit date: 12/08/2012    Years since quitting: 10.5   Smokeless tobacco: Former    Quit date: 10/02/2014  Vaping Use   Vaping status: Never Used  Substance Use Topics   Alcohol use: Not Currently    Comment: had some wine in the last week   Drug use: Not Currently    Types: Marijuana    Comment: last smoked was when found out was pregnant with current pregnancy     Allergies: No Known Allergies  Medications Prior to Admission  Medication Sig Dispense Refill Last Dose   Blood Pressure Monitoring (BLOOD PRESSURE KIT) DEVI 1 kit by Does not apply route as needed. (Patient not taking: Reported on 07/01/2023) 1 each 0    ferrous sulfate 325 (65 FE) MG tablet Take 1 tablet (325 mg total) by mouth every other day. (Patient not  taking: Reported on 05/15/2023) 90 tablet 1    Misc. Devices (GOJJI WEIGHT SCALE) MISC 1 Device by Does not apply route as needed. (Patient not taking: Reported on 07/01/2023) 1 each 0    Prenatal Vit-Fe Phos-FA-Omega (VITAFOL GUMMIES) 3.33-0.333-34.8 MG CHEW Chew 3 tablets by mouth daily. (Patient not taking: Reported on 05/15/2023) 90 tablet 11    ROS reviewed and pertinent positives and negatives as documented in HPI.  Physical Exam   Blood pressure 100/60, pulse 85, temperature (!) 97.5 F (36.4 C), temperature source Axillary, resp. rate 16, height 5\' 3"  (1.6 m), weight 58.6 kg, last menstrual period 08/14/2022, SpO2 100%, unknown if currently breastfeeding.  Physical Exam Constitutional:      General: She is not in acute distress.    Appearance: Normal appearance. She is not ill-appearing.  HENT:     Head: Normocephalic and atraumatic.  Cardiovascular:     Rate and Rhythm: Normal rate.  Pulmonary:     Effort: Pulmonary effort is normal.     Breath sounds: Normal breath sounds.  Abdominal:     Palpations: Abdomen is soft.     Tenderness: There is no abdominal tenderness. There is no guarding.  Musculoskeletal:        General: Normal range of motion.  Skin:    General: Skin is warm and dry.     Findings: No rash.  Neurological:     General: No focal deficit present.     Mental Status: She is alert and oriented to person, place, and time.   EFM: 150/mod/+a/-d  MAU Course  Procedures  MDM 32 y.o. [id number] at [redacted]w[redacted]d who presents for rule out SROM. Has been leaking x 1 week -- reports whitish/creamy discharge. In clinic, pooling negative, but ?Nitrazine was positive. Will check ROMplus and assess dispo following.   8:13 PM  Signout given to [employer].  Sundra Aland, MD OB Fellow, Faculty Practice [facility], Center for [facility]   Assessment and Plan    Sundra Aland 07/01/2023, 8:09 PM

## 2023-07-01 NOTE — H&P (Signed)
OBSTETRIC ADMISSION HISTORY AND PHYSICAL  Candice White is a 32 y.o. female [phone] with IUP at [redacted]w[redacted]d (dated by 47 week [place], Estimated Date of Delivery: 07/06/23) presenting for r/o ROM. She presents today to MAU from clinic. She was seen for ROB today and reported leaking of fluid x 1 week. Describes her fluid as creamy white discharge, has been leaking constantly. She reports having similar presentation with a previous pregnancy where she had a ?high leak. She denies any vaginal bleeding, regular contractions. Has been having Braxton Hicks contractions, but nothing very uncomfortable. Has good FM. No other complaints or concerns today.  She plans on breast feeding. She is undecided on birth control. She received her prenatal care at [facility]   Prenatal History/Complications:  Anemia Sparse care in third trimester  Past Medical History: Past Medical History:  Diagnosis Date   BV (bacterial vaginosis)    Migraine    chronic ha    PID (acute pelvic inflammatory disease)     Past Surgical History: Past Surgical History:  Procedure Laterality Date   NO PAST SURGERIES      Obstetrical History: OB History     Gravida  4   Para  3   Term  3   Preterm      AB      Living  3      SAB      IAB      Ectopic      Multiple  0   Live Births  3           Social History Social History   Socioeconomic History   Marital status: Single    Spouse name: Not on file   Number of children: Not on file   Years of education: Not on file   Highest education level: Not on file  Occupational History   Not on file  Tobacco Use   Smoking status: Former    Current packs/day: 0.00    Types: Cigarettes    Start date: 12/08/2009    Quit date: 12/08/2012    Years since quitting: 10.5   Smokeless tobacco: Former    Quit date: 10/02/2014  Vaping Use   Vaping status: Never Used  Substance and Sexual Activity   Alcohol use: Not Currently    Comment: had some wine in the last  week   Drug use: Not Currently    Types: Marijuana    Comment: last smoked was when found out was pregnant with current pregnancy    Sexual activity: Yes    Partners: Male    Birth control/protection: None  Other Topics Concern   Not on file  Social History Narrative   Not on file   Social Determinants of Health   Financial Resource Strain: Not on file  Food Insecurity: Not on file  Transportation Needs: Not on file  Physical Activity: Not on file  Stress: Not on file  Social Connections: Not on file    Family History: Family History  Problem Relation Age of Onset   Hypertension Mother    Vision loss Father    Cancer Maternal Aunt        lung cancer   Cancer Cousin        lung    Allergies: No Known Allergies  Medications Prior to Admission  Medication Sig Dispense Refill Last Dose   Blood Pressure Monitoring (BLOOD PRESSURE KIT) DEVI 1 kit by Does not apply route as needed. (Patient not  taking: Reported on 07/01/2023) 1 each 0    ferrous sulfate 325 (65 FE) MG tablet Take 1 tablet (325 mg total) by mouth every other day. (Patient not taking: Reported on 05/15/2023) 90 tablet 1    Misc. Devices (GOJJI WEIGHT SCALE) MISC 1 Device by Does not apply route as needed. (Patient not taking: Reported on 07/01/2023) 1 each 0    Prenatal Vit-Fe Phos-FA-Omega (VITAFOL GUMMIES) 3.33-0.333-34.8 MG CHEW Chew 3 tablets by mouth daily. (Patient not taking: Reported on 05/15/2023) 90 tablet 11      Review of Systems  All systems reviewed and negative except as stated in HPI.  Blood pressure 100/60, pulse 85, temperature (!) 97.5 F (36.4 C), temperature source Axillary, resp. rate 16, height 5\' 3"  (1.6 m), weight 58.6 kg, last menstrual period 08/14/2022, SpO2 100%, unknown if currently breastfeeding. General appearance: alert and cooperative Lungs: clear to auscultation bilaterally Heart: regular rate and rhythm Abdomen: soft, non-tender; bowel sounds normal Pelvic:  deferred Extremities: no edema of bilateral lower extremities Presentation: cephalic Fetal monitoring: 150/mod/+a/-d Uterine activity: q54min   Prenatal labs: ABO, Rh: CANCELED/CANCELED/-- (04/15 1438) Antibody: CANCELED (04/15 1438) Rubella: CANCELED (04/15 1438) RPR: Non Reactive (08/15 0905)  HBsAg: CANCELED (04/15 1438)  HIV: Non Reactive (08/15 0905)  GBS:    2 hr Glucola normal Genetic screening  LR female Anatomy US Normal At [redacted]w[redacted]d - f/up US done after anatomy for LLP, which resolved, breech presentation, normal cord insertion, normal AFI  Prenatal Transfer Tool  Maternal Diabetes: No Genetic Screening: Normal Maternal Ultrasounds/Referrals: Normal Fetal Ultrasounds or other Referrals:  None Maternal Substance Abuse:  No Significant Maternal Medications:  None Significant Maternal Lab Results:  GBS unknown, Rh positive Number of Prenatal Visits:Less than or equal to 3 verified prenatal visits Other Comments:   N/a  Results for orders placed or performed during the hospital encounter of 07/01/23 (from the past 24 hour(s))  Rupture of Membrane (ROM) Plus   Collection Time: 07/01/23  6:37 PM  Result Value Ref Range   Rom Plus POSITIVE     Patient Active Problem List   Diagnosis Date Noted   Vaginal discharge during pregnancy 05/15/2023   Supervision of other normal pregnancy, antepartum 01/13/2023   Anemia 06/01/2018    Assessment/Plan:  ADRAIN BUTRICK is a 32 y.o. G4P3003 at [redacted]w[redacted]d here for SROM  #Labor: SROM reportedly a week ago, having q5 ctx, was 3cm in clinic today, not rechecked in MAU, will recheck pending progress  consider Pitocin pending cervical change #Pain: Interested in unmedicated birth, open to nitrous #FWB: Cat I #ID:  GBS unknown, PCR pending #MOF: breast #MOC: unsure #Circ:  N/a  Sundra Aland, MD OB Fellow, Faculty Practice [facility], Center for [facility] 07/01/23 8:22 PM

## 2023-07-01 NOTE — Progress Notes (Cosign Needed Addendum)
   PRENATAL VISIT NOTE  Subjective:  Candice White is a 32 y.o. [phone] at [redacted]w[redacted]d being seen today for ongoing prenatal care.  She is currently monitored for the following issues for this low-risk pregnancy and has Anemia; Supervision of other normal pregnancy, antepartum; and Vaginal discharge during pregnancy on their problem list.  Patient doing well with no acute concerns today. She reports no complaints.  Contractions: Irritability. Vag. Bleeding: None.  Movement: Present. Endorses continual leakage over last couple of days.  She states she's never had her water break on its own so is unsure, but doesn't smell like urine.   The following portions of the patient's history were reviewed and updated as appropriate: allergies, current medications, past family history, past medical history, past social history, past surgical history and problem list. Problem list updated.  Objective:   Vitals:   07/01/23 1433  BP: 98/68  Pulse: 96  Weight: 128 lb 6.4 oz (58.2 kg)    Fetal Status: Fetal Heart Rate (bpm): 140 Fundal Height: 38 cm Movement: Present  Presentation: Vertex (bedside US)  General:  Alert, oriented and cooperative. Patient is in no acute distress.  Skin: Skin is warm and dry. No rash noted.   Cardiovascular: Normal heart rate noted  Respiratory: Normal respiratory effort, no problems with respiration noted  Abdomen: Soft, gravid, appropriate for gestational age.  Pain/Pressure: Present     Pelvic: Cervical exam performed Dilation: 3 Effacement (%): 50 Station: -3  Extremities: Normal range of motion.  Edema: Trace  Mental Status:  Normal mood and affect. Normal behavior. Normal judgment and thought content.   Assessment and Plan:  Pregnancy: G4P3003 at [redacted]w[redacted]d  1. [redacted] weeks gestation of pregnancy - patient unsure if she ruptured waters yet, negative pooling, + Nitrazine / + Amnisure, neg ferning - considered unequivocal testing for rupture, sent to MAU for ROR - given possibly  not ruptured will set IOL for [redacted]w[redacted]d (10/6, midnight) for post dates IOL as well as BPP during [redacted]week gestation in case bumped for medical IOL  - [place] MFM FETAL BPP WO NON STRESS; Future  2. Supervision of other normal pregnancy, antepartum  3. Iron deficiency anemia, unspecified iron deficiency anemia type - pt decided to not have iron infusions, compliant with PO iron supplementation w/o complaint   Term labor symptoms and general obstetric precautions including but not limited to vaginal bleeding, contractions, leaking of fluid and fetal movement were reviewed in detail with the patient.  Please refer to After Visit Summary for other counseling recommendations.   Return in about 1 week (around 07/08/2023) for ROB, NST .  Mittie Bodo, MD Family Medicine - Obstetrics Fellow

## 2023-07-02 ENCOUNTER — Encounter (HOSPITAL_COMMUNITY): Payer: Self-pay | Admitting: Obstetrics and Gynecology

## 2023-07-02 DIAGNOSIS — Z3A39 39 weeks gestation of pregnancy: Secondary | ICD-10-CM

## 2023-07-02 DIAGNOSIS — O4202 Full-term premature rupture of membranes, onset of labor within 24 hours of rupture: Secondary | ICD-10-CM

## 2023-07-02 LAB — RPR: RPR Ser Ql: NONREACTIVE

## 2023-07-02 MED ORDER — ZOLPIDEM TARTRATE 5 MG PO TABS: 5.0000 mg | ORAL_TABLET | Freq: Every evening | ORAL | Status: DC | PRN

## 2023-07-02 MED ORDER — IBUPROFEN 600 MG PO TABS
600.0000 mg | ORAL_TABLET | Freq: Four times a day (QID) | ORAL | Status: DC
  Administered 2023-07-02 – 2023-07-03 (×3): 600 mg via ORAL
  Filled 2023-07-02 (×6): qty 1

## 2023-07-02 MED ORDER — ACETAMINOPHEN 325 MG PO TABS: 650.0000 mg | ORAL_TABLET | ORAL | Status: DC | PRN

## 2023-07-02 MED ORDER — SODIUM CHLORIDE 0.9% FLUSH: 3.0000 mL | INTRAVENOUS | Status: DC | PRN

## 2023-07-02 MED ORDER — ONDANSETRON HCL 4 MG/2ML IJ SOLN: 4.0000 mg | INTRAMUSCULAR | Status: DC | PRN

## 2023-07-02 MED ORDER — TRANEXAMIC ACID-NACL 1000-0.7 MG/100ML-% IV SOLN
1000.0000 mg | INTRAVENOUS | Status: AC
  Administered 2023-07-02: 1000 mg via INTRAVENOUS

## 2023-07-02 MED ORDER — BENZOCAINE-MENTHOL 20-0.5 % EX AERO: 1.0000 | INHALATION_SPRAY | CUTANEOUS | Status: DC | PRN

## 2023-07-02 MED ORDER — DIBUCAINE (PERIANAL) 1 % EX OINT: 1.0000 | TOPICAL_OINTMENT | CUTANEOUS | Status: DC | PRN

## 2023-07-02 MED ORDER — WITCH HAZEL-GLYCERIN EX PADS: 1.0000 | MEDICATED_PAD | CUTANEOUS | Status: DC | PRN

## 2023-07-02 MED ORDER — SODIUM CHLORIDE 0.9 % IV SOLN: 250.0000 mL | INTRAVENOUS | Status: DC | PRN

## 2023-07-02 MED ORDER — SENNOSIDES-DOCUSATE SODIUM 8.6-50 MG PO TABS
2.0000 | ORAL_TABLET | ORAL | Status: DC
  Administered 2023-07-02 – 2023-07-03 (×2): 2 via ORAL
  Filled 2023-07-02 (×2): qty 2

## 2023-07-02 MED ORDER — TRANEXAMIC ACID-NACL 1000-0.7 MG/100ML-% IV SOLN
INTRAVENOUS | Status: AC
  Filled 2023-07-02: qty 100

## 2023-07-02 MED ORDER — ONDANSETRON HCL 4 MG PO TABS: 4.0000 mg | ORAL_TABLET | ORAL | Status: DC | PRN

## 2023-07-02 MED ORDER — DIPHENHYDRAMINE HCL 25 MG PO CAPS: 25.0000 mg | ORAL_CAPSULE | Freq: Four times a day (QID) | ORAL | Status: DC | PRN

## 2023-07-02 MED ORDER — SIMETHICONE 80 MG PO CHEW: 80.0000 mg | CHEWABLE_TABLET | ORAL | Status: DC | PRN

## 2023-07-02 MED ORDER — COCONUT OIL OIL: 1.0000 | TOPICAL_OIL | Status: DC | PRN

## 2023-07-02 MED ORDER — PRENATAL MULTIVITAMIN CH
1.0000 | ORAL_TABLET | Freq: Every day | ORAL | Status: DC
  Administered 2023-07-02 – 2023-07-03 (×2): 1 via ORAL
  Filled 2023-07-02 (×2): qty 1

## 2023-07-02 MED ORDER — SODIUM CHLORIDE 0.9% FLUSH
3.0000 mL | Freq: Two times a day (BID) | INTRAVENOUS | Status: DC
  Administered 2023-07-02 – 2023-07-03 (×3): 3 mL via INTRAVENOUS

## 2023-07-02 NOTE — Progress Notes (Signed)
Labor Progress Note Candice White is a 32 y.o. [phone] at [redacted]w[redacted]d presented for SROM ~1-2 weeks ago S: Comfortable. Discussed risk, benefits and indications for pitocin, IUPC, AROM. Patient desires minimal pain/discomfort through labor.   O:  BP 103/72   Pulse 83   Temp 98.6 F (37 C) (Oral)   Resp 16   Ht 5\' 3"  (1.6 m)   Wt 58.6 kg   LMP 08/14/2022   SpO2 100%   BMI 22.87 kg/m  EFM: 140/moderate variability/accels present/no decels  CVE: Dilation: 5.5 Effacement (%): 80 Station: -1 Presentation: Vertex Exam by:: Dr. Leanora Cover   A&P: 32 y.o. [id number] [redacted]w[redacted]d admitted for SROM #Labor: Progressing well.  #Pain: Well controlled. Discussed options: IV fentanyl, Nitrous and epidural. Declined for now.  #FWB: Cat I #GBS negative   Wyn Forster, MD 6:39 AM

## 2023-07-02 NOTE — Discharge Summary (Signed)
Postpartum Discharge Summary    Patient Name: Candice White DOB: [birth year] MRN: [id number]  Date of admission: 07/01/2023 Delivery date:07/02/2023 Delivering provider: CHUBB, CASEY C Date of discharge: 07/03/2023  Admitting diagnosis: PROM (premature rupture of membranes) [O42.90] Intrauterine pregnancy: [redacted]w[redacted]d     Secondary diagnosis:  Active Problems:   PROM (premature rupture of membranes)  Additional problems: None    Discharge diagnosis: Term Pregnancy Delivered                                              Post partum procedures: None Augmentation: AROM and Pitocin Complications: None  Hospital course: Onset of Labor With Vaginal Delivery      32 y.o. yo [id number] at [redacted]w[redacted]d was admitted in Latent Labor on 07/01/2023. Labor course was complicated by n/a.   Membrane Rupture Time/Date: 5:50 AM,07/02/2023  Delivery Method:Vaginal, Spontaneous Operative Delivery:N/A Episiotomy: None Lacerations:  None Patient had a postpartum course complicated by none.  She is ambulating, tolerating a regular diet, passing flatus, and urinating well. Patient is discharged home in stable condition on 07/03/23.  Newborn Data: Birth date:07/02/2023 Birth time:10:08 AM Gender:Female Living status:Living Apgars:8 ,8  Weight:7 lb 10.1 oz (3.46 kg)  Magnesium Sulfate received: No BMZ received: No Rhophylac:N/A MMR:N/A T-DaP: declined prenatally  Flu: No RSV Vaccine received: No Transfusion:No  Immunizations received: Immunization History  Administered Date(s) Administered   Tdap 05/26/2015    Physical exam  Vitals:   07/02/23 1730 07/02/23 2153 07/03/23 0135 07/03/23 0628  BP: (!) 97/53 (!) 95/54 97/62 95/64   Pulse: 87 85 77 78  Resp: 16 16 16 16   Temp: 98.7 F (37.1 C) 98.6 F (37 C) 98.6 F (37 C) 98.4 F (36.9 C)  TempSrc: Oral Oral Oral Oral  SpO2: 99% 100% 99% 99%  Weight:      Height:       General: alert, cooperative, and no distress Lochia: appropriate Uterine Fundus:  firm Incision: N/A DVT Evaluation: No evidence of DVT seen on physical exam. Labs: Lab Results  Component Value Date   WBC 12.9 (H) 07/03/2023   HGB 7.3 (L) 07/03/2023   HCT 24.1 (L) 07/03/2023   MCV 79.0 (L) 07/03/2023   PLT 279 07/03/2023      Latest Ref Rng & Units 01/05/2013   11:39 AM  CMP  Glucose 70 - 99 mg/dL 80   BUN 6 - 23 mg/dL 13   Creatinine 1.47 - 1.10 mg/dL 8.29   Sodium 562 - 130 mEq/L 138   Potassium 3.5 - 5.3 mEq/L 3.9   Chloride 96 - 112 mEq/L 107   CO2 19 - 32 mEq/L 24   Calcium 8.4 - 10.5 mg/dL 86.5    Edinburgh Score:    07/02/2023   12:25 PM  Edinburgh Postnatal Depression Scale Screening Tool  I have been able to laugh and see the funny side of things. 0  I have looked forward with enjoyment to things. 0  I have blamed myself unnecessarily when things went wrong. 2  I have been anxious or worried for no good reason. 2  I have felt scared or panicky for no good reason. 2  Things have been getting on top of me. 0  I have been so unhappy that I have had difficulty sleeping. 2  I have felt sad or miserable. 1  I have been so unhappy that  I have been crying. 0  The thought of harming myself has occurred to me. 0  Edinburgh Postnatal Depression Scale Total 9   Edinburgh Postnatal Depression Scale Total: 9  After visit meds:  Allergies as of 07/03/2023   No Known Allergies      Medication List     STOP taking these medications    Blood Pressure Kit Devi   Gojji Weight Scale Misc       TAKE these medications    acetaminophen 325 MG tablet Commonly known as: Tylenol Take 2 tablets (650 mg total) by mouth every 4 (four) hours as needed (for pain scale < 4).   ferrous sulfate 325 (65 FE) MG tablet Take 1 tablet (325 mg total) by mouth every other day.   ibuprofen 600 MG tablet Commonly known as: ADVIL Take 1 tablet (600 mg total) by mouth every 6 (six) hours.   Vitafol Gummies 3.33-0.333-34.8 MG Chew Chew 3 tablets by mouth daily.        Discharge home in stable condition Infant Feeding: Breast Infant Disposition:home with mother Discharge instruction: per After Visit Summary and Postpartum booklet. Activity: Advance as tolerated. Pelvic rest for 6 weeks.  Diet: routine diet  Future Appointments: Future Appointments  Date Time Provider Department Center  07/30/2023  2:10 PM Sue Lush, FNP [facility] None   Follow up Visit: Sent message to [facility] 10/2  Please schedule this patient for a In person postpartum visit in 4 weeks with the following provider: Any provider. Additional Postpartum F/U: n/a   Low risk pregnancy complicated by:  n/a Delivery mode:  Vaginal, Spontaneous Anticipated Birth Control:  Unsure  07/03/2023 Bernerd Limbo, CNM

## 2023-07-02 NOTE — Lactation Note (Signed)
This note was copied from a baby's chart. Lactation Consultation Note  Patient Name: Candice White [id number] Date: 07/02/2023 Age:31 hours Reason for consult: Initial assessment;Term  P4- MOB states that infant is nursing very well and denies any pain or discomfort. Infant was currently nursing on MOB's right breast in the cradle hold. Infant's positioning and latch were both great. LC heard multiple swallows. MOB denies having any questions or concerns at this moment.  LC reviewed feeding infant on demand 8-12x in 24 hrs, not allowing infant to go over 3 hrs without feeding, LC services handout, and CDC milk storage guidelines.LC encouraged MOB to call for further assistance as needed.   Maternal Data Does the patient have breastfeeding experience prior to this delivery?: Yes How long did the patient breastfeed?: 4 months with first child, 1 year with second child, 3 months with third child.  Feeding Mother's Current Feeding Choice: Breast Milk  LATCH Score Latch: Grasps breast easily, tongue down, lips flanged, rhythmical sucking.  Audible Swallowing: Spontaneous and intermittent  Type of Nipple: Everted at rest and after stimulation  Comfort (Breast/Nipple): Soft / non-tender  Hold (Positioning): No assistance needed to correctly position infant at breast.  LATCH Score: 10   Lactation Tools Discussed/Used Pump Education: Milk Storage  Interventions Interventions: Breast feeding basics reviewed;Education;LC Services brochure  Discharge Discharge Education: Warning signs for feeding baby Pump: DEBP;Personal  Consult Status Consult Status: Follow-up Date: 07/03/23 Follow-up type: In-patient    Dema Severin BS, IBCLC 07/02/2023, 6:31 PM

## 2023-07-02 NOTE — Progress Notes (Signed)
Labor Progress Note Candice White is a 32 y.o. [phone] at [redacted]w[redacted]d presented for SROM S: Coping well, requesting AROM of forebag  O:  BP 103/72   Pulse 83   Temp 98.6 F (37 C) (Oral)   Resp 16   Ht 5\' 3"  (1.6 m)   Wt 58.6 kg   LMP 08/14/2022   SpO2 100%   BMI 22.87 kg/m  EFM: 140/moderate variability/accels present/no decels  CVE: Dilation: 5.5 Effacement (%): 80 Station: -1 Presentation: Vertex Exam by:: Dr. Leanora Cover   A&P: 32 y.o. [id number] [redacted]w[redacted]d admitted for SROM  #Labor: Progressing well. AROM forebag clear. #Pain: Coping well, may want Nitrous but not yet. Trying to avoid epidural.  #FWB: Cat I #GBS negative   Wyn Forster, MD 6:45 AM

## 2023-07-02 NOTE — Lactation Note (Signed)
This note was copied from a baby's chart. Lactation Consultation Note  Patient Name: Candice White [id number] Date: 07/02/2023 Age:32 hours   P4- LC attempted to see MOB, but she was sleeping. LC will attempt again later.   Dema Severin BS, IBCLC 07/02/2023, 3:19 PM

## 2023-07-03 LAB — CBC
HCT: 24.1 % — ABNORMAL LOW (ref 36.0–46.0)
Hemoglobin: 7.3 g/dL — ABNORMAL LOW (ref 12.0–15.0)
MCH: 23.9 pg — ABNORMAL LOW (ref 26.0–34.0)
MCHC: 30.3 g/dL (ref 30.0–36.0)
MCV: 79 fL — ABNORMAL LOW (ref 80.0–100.0)
Platelets: 279 10*3/uL (ref 150–400)
RBC: 3.05 MIL/uL — ABNORMAL LOW (ref 3.87–5.11)
RDW: 18.4 % — ABNORMAL HIGH (ref 11.5–15.5)
WBC: 12.9 10*3/uL — ABNORMAL HIGH (ref 4.0–10.5)
nRBC: 0 % (ref 0.0–0.2)

## 2023-07-03 MED ORDER — IBUPROFEN 600 MG PO TABS: 600.0000 mg | ORAL_TABLET | Freq: Four times a day (QID) | ORAL | 0 refills | Status: AC

## 2023-07-03 MED ORDER — ACETAMINOPHEN 325 MG PO TABS: 650.0000 mg | ORAL_TABLET | ORAL | 0 refills | Status: AC | PRN

## 2023-07-03 NOTE — Progress Notes (Signed)
CSW received consult for hx of Anxiety, Depression and transportation issues.  CSW met with MOB to offer support and complete assessment. CSW entered the room, introduced herself and explained the reason for the visit. MOB presented as calm, was agreeable to consult and remained engaged throughout encounter.  CSW inquired about MOB's mental health history. MOB reported experiencing PPD with symptoms that included sadness, feeling overwhelmed and difficulty with handling life struggles. MOB reported relying on FOB for support. MOB denied experiencing mental health prior to her pregnancies. MOB reported feeling "good" and bonded well with the infant. CSW provided education regarding the baby blues period vs. perinatal mood disorders, discussed treatment and gave resources for mental health follow up if concerns arise.  CSW recommends self-evaluation during the postpartum time period using the New Mom Checklist from Postpartum Progress and encouraged MOB to contact a medical professional if symptoms are noted at any time.  CSW assessed for safety with MOB SI/HI/DV;MOB denied all.   CSW asked MOB has she selected a pediatrician for the infant's follow up visits; MOB said [facility] [place].  MOB reported having all essential items for the infant including a carseat, bassinet and crib for safe sleeping. CSW provided review of Sudden Infant Death Syndrome (SIDS) precautions.   CSW asked MOB about the alcohol consumption during pregnancy. MOB reported looking online and reading that it was "ok" to consume alcohol. MOB reported informing her OB about the  1 glass of wine she consumed and was informed to  not consume alcohol during pregnancy. MOB reported no longer drinking and denied needing support at this time.  CSW asked MOB about the transportation barriers she has been experiencing. MOB reported at the moment that don't have a car; however FOB's mom has been  supportive with giving them rides. CSW asked MOB about using medicaid transportation for support. MOB reported she tried using medicaid in the past; however the rides were inconsistent and at times they would cancel the rides with no warning.   CSW identifies no further need for intervention and no barriers to discharge at this time.   Enos Fling, Theresia Majors Clinical Social Worker [phone]

## 2023-07-03 NOTE — Plan of Care (Signed)
Problem: Education: Goal: Knowledge of General Education information will improve Description: Including pain rating scale, medication(s)/side effects and non-pharmacologic comfort measures 07/03/2023 1143 by Donne Hazel, LPN Outcome: Adequate for Discharge 07/03/2023 0800 by Donne Hazel, LPN Outcome: Progressing 07/03/2023 0759 by Donne Hazel, LPN Outcome: Progressing   Problem: Health Behavior/Discharge Planning: Goal: Ability to manage health-related needs will improve 07/03/2023 1143 by Donne Hazel, LPN Outcome: Adequate for Discharge 07/03/2023 0800 by Donne Hazel, LPN Outcome: Progressing 07/03/2023 0759 by Donne Hazel, LPN Outcome: Progressing   Problem: Clinical Measurements: Goal: Ability to maintain clinical measurements within normal limits will improve 07/03/2023 1143 by Donne Hazel, LPN Outcome: Adequate for Discharge 07/03/2023 0800 by Donne Hazel, LPN Outcome: Progressing 07/03/2023 0759 by Donne Hazel, LPN Outcome: Progressing Goal: Will remain free from infection 07/03/2023 1143 by Donne Hazel, LPN Outcome: Adequate for Discharge 07/03/2023 0800 by Donne Hazel, LPN Outcome: Progressing 07/03/2023 0759 by Donne Hazel, LPN Outcome: Progressing Goal: Diagnostic test results will improve 07/03/2023 1143 by Donne Hazel, LPN Outcome: Adequate for Discharge 07/03/2023 0800 by Donne Hazel, LPN Outcome: Progressing 07/03/2023 0759 by Donne Hazel, LPN Outcome: Progressing Goal: Respiratory complications will improve 07/03/2023 1143 by Donne Hazel, LPN Outcome: Adequate for Discharge 07/03/2023 0800 by Donne Hazel, LPN Outcome: Progressing 07/03/2023 0759 by Donne Hazel, LPN Outcome: Progressing Goal: Cardiovascular complication will be avoided 07/03/2023 1143 by Donne Hazel, LPN Outcome: Adequate for Discharge 07/03/2023 0800 by Donne Hazel, LPN Outcome: Progressing 07/03/2023 0759 by Donne Hazel, LPN Outcome:  Progressing   Problem: Activity: Goal: Risk for activity intolerance will decrease 07/03/2023 1143 by Donne Hazel, LPN Outcome: Adequate for Discharge 07/03/2023 0800 by Donne Hazel, LPN Outcome: Progressing 07/03/2023 0759 by Donne Hazel, LPN Outcome: Progressing   Problem: Nutrition: Goal: Adequate nutrition will be maintained 07/03/2023 1143 by Donne Hazel, LPN Outcome: Adequate for Discharge 07/03/2023 0800 by Donne Hazel, LPN Outcome: Progressing 07/03/2023 0759 by Donne Hazel, LPN Outcome: Progressing   Problem: Coping: Goal: Level of anxiety will decrease 07/03/2023 1143 by Donne Hazel, LPN Outcome: Adequate for Discharge 07/03/2023 0800 by Donne Hazel, LPN Outcome: Progressing 07/03/2023 0759 by Donne Hazel, LPN Outcome: Progressing   Problem: Elimination: Goal: Will not experience complications related to bowel motility 07/03/2023 1143 by Donne Hazel, LPN Outcome: Adequate for Discharge 07/03/2023 0800 by Donne Hazel, LPN Outcome: Progressing 07/03/2023 0759 by Donne Hazel, LPN Outcome: Progressing Goal: Will not experience complications related to urinary retention 07/03/2023 1143 by Donne Hazel, LPN Outcome: Adequate for Discharge 07/03/2023 0800 by Donne Hazel, LPN Outcome: Progressing 07/03/2023 0759 by Donne Hazel, LPN Outcome: Progressing   Problem: Pain Managment: Goal: General experience of comfort will improve 07/03/2023 1143 by Donne Hazel, LPN Outcome: Adequate for Discharge 07/03/2023 0800 by Donne Hazel, LPN Outcome: Progressing 07/03/2023 0759 by Donne Hazel, LPN Outcome: Progressing   Problem: Safety: Goal: Ability to remain free from injury will improve 07/03/2023 1143 by Donne Hazel, LPN Outcome: Adequate for Discharge 07/03/2023 0800 by Donne Hazel, LPN Outcome: Progressing 07/03/2023 0759 by Donne Hazel, LPN Outcome: Progressing   Problem: Skin Integrity: Goal: Risk for impaired skin  integrity will decrease 07/03/2023 1143 by Donne Hazel, LPN Outcome: Adequate for Discharge 07/03/2023 0800 by Donne Hazel, LPN Outcome: Progressing 07/03/2023 0759 by Donne Hazel, LPN  Outcome: Progressing   Problem: Education: Goal: Knowledge of condition will improve 07/03/2023 1143 by Donne Hazel, LPN Outcome: Adequate for Discharge 07/03/2023 0800 by Donne Hazel, LPN Outcome: Progressing 07/03/2023 0759 by Donne Hazel, LPN Outcome: Progressing Goal: Individualized Educational Video(s) 07/03/2023 1143 by Donne Hazel, LPN Outcome: Adequate for Discharge 07/03/2023 0800 by Donne Hazel, LPN Outcome: Progressing 07/03/2023 0759 by Donne Hazel, LPN Outcome: Progressing Goal: Individualized Newborn Educational Video(s) 07/03/2023 1143 by Donne Hazel, LPN Outcome: Adequate for Discharge 07/03/2023 0800 by Donne Hazel, LPN Outcome: Progressing 07/03/2023 0759 by Donne Hazel, LPN Outcome: Progressing   Problem: Activity: Goal: Will verbalize the importance of balancing activity with adequate rest periods 07/03/2023 1143 by Donne Hazel, LPN Outcome: Adequate for Discharge 07/03/2023 0800 by Donne Hazel, LPN Outcome: Progressing 07/03/2023 0759 by Donne Hazel, LPN Outcome: Progressing Goal: Ability to tolerate increased activity will improve 07/03/2023 1143 by Donne Hazel, LPN Outcome: Adequate for Discharge 07/03/2023 0800 by Donne Hazel, LPN Outcome: Progressing 07/03/2023 0759 by Donne Hazel, LPN Outcome: Progressing   Problem: Coping: Goal: Ability to identify and utilize available resources and services will improve 07/03/2023 1143 by Donne Hazel, LPN Outcome: Adequate for Discharge 07/03/2023 0800 by Donne Hazel, LPN Outcome: Progressing 07/03/2023 0759 by Donne Hazel, LPN Outcome: Progressing   Problem: Life Cycle: Goal: Chance of risk for complications during the postpartum period will decrease 07/03/2023 1143 by Donne Hazel, LPN Outcome: Adequate for Discharge 07/03/2023 0800 by Donne Hazel, LPN Outcome: Progressing 07/03/2023 0759 by Donne Hazel, LPN Outcome: Progressing   Problem: Role Relationship: Goal: Ability to demonstrate positive interaction with newborn will improve 07/03/2023 1143 by Donne Hazel, LPN Outcome: Adequate for Discharge 07/03/2023 0800 by Donne Hazel, LPN Outcome: Progressing 07/03/2023 0759 by Donne Hazel, LPN Outcome: Progressing   Problem: Skin Integrity: Goal: Demonstration of wound healing without infection will improve 07/03/2023 1143 by Donne Hazel, LPN Outcome: Adequate for Discharge 07/03/2023 0800 by Donne Hazel, LPN Outcome: Progressing 07/03/2023 0759 by Donne Hazel, LPN Outcome: Progressing

## 2023-07-03 NOTE — Plan of Care (Signed)

## 2023-07-06 ENCOUNTER — Inpatient Hospital Stay (HOSPITAL_COMMUNITY): Payer: Medicaid Other

## 2023-07-07 ENCOUNTER — Other Ambulatory Visit: Payer: Medicaid Other

## 2023-07-07 ENCOUNTER — Ambulatory Visit: Payer: Medicaid Other

## 2023-07-08 ENCOUNTER — Encounter: Payer: Medicaid Other | Admitting: Family Medicine

## 2023-07-12 ENCOUNTER — Inpatient Hospital Stay (HOSPITAL_COMMUNITY): Payer: Medicaid Other

## 2023-07-12 ENCOUNTER — Inpatient Hospital Stay (HOSPITAL_COMMUNITY)
Admission: RE | Admit: 2023-07-12 | Payer: Medicaid Other | Source: Home / Self Care | Admitting: Obstetrics and Gynecology

## 2023-07-29 ENCOUNTER — Telehealth (HOSPITAL_COMMUNITY): Payer: Self-pay

## 2023-07-29 DIAGNOSIS — Z1331 Encounter for screening for depression: Secondary | ICD-10-CM

## 2023-07-29 NOTE — Telephone Encounter (Signed)
07/29/2023 1339  Name: Candice White MRN: [id number] DOB: Apr 27, 1991  Reason for Call:  Transition of Care Hospital Discharge Call  Contact Status: Patient Contact Status: Complete  Language assistant needed: Interpreter Mode: Interpreter Not Needed        Follow-Up Questions: Do You Have Any Concerns About Your Health As You Heal From Delivery?: No Do You Have Any Concerns About Your Infants Health?: No  Edinburgh Postnatal Depression Scale:  In the Past 7 Days: I have been able to laugh and see the funny side of things.: Not at all I have looked forward with enjoyment to things.: Definitely less than I used to I have blamed myself unnecessarily when things went wrong.: Yes, most of the time I have been anxious or worried for no good reason.: Yes, very often I have felt scared or panicky for no good reason.: No, not much Things have been getting on top of me.: Yes, sometimes I haven't been coping as well as usual I have been so unhappy that I have had difficulty sleeping.: Yes, most of the time I have felt sad or miserable.: Yes, most of the time I have been so unhappy that I have been crying.: Yes, most of the time The thought of harming myself has occurred to me.: Never Edinburgh Postnatal Depression Scale Total: (!) 23  PHQ2-9 Depression Scale:     Discharge Follow-up: Edinburgh score requires follow up?: Yes (Integrated Behavioral Health referral placed. Patient consents to having a counseling appointment.) Provider notified of Edinburgh score?: Yes Have you already been referred for a counseling appointment?: No Patient was advised of the following resources:: Support Group, Breastfeeding Support Group (RN told patient about Maternal Mental Health Resources ([facility], National Maternal Mental Health Hotline, Postpartum Support International, National Suicide and Crisis Lifeline). Also told patient about [facility] support group offerings.) Patient referred to::  OB Will e-mail Maternal Mental Health resources and support group information to patient as well.  Post-discharge interventions: Reviewed Newborn Safe Sleep Practices  Signature  Signe Colt

## 2023-07-30 ENCOUNTER — Telehealth: Payer: Self-pay | Admitting: Clinical

## 2023-07-30 ENCOUNTER — Encounter: Payer: Self-pay | Admitting: Obstetrics and Gynecology

## 2023-07-30 ENCOUNTER — Ambulatory Visit: Payer: Medicaid Other | Admitting: Obstetrics and Gynecology

## 2023-07-30 DIAGNOSIS — F53 Postpartum depression: Secondary | ICD-10-CM | POA: Insufficient documentation

## 2023-07-30 DIAGNOSIS — O99019 Anemia complicating pregnancy, unspecified trimester: Secondary | ICD-10-CM

## 2023-07-30 MED ORDER — FERROUS SULFATE 325 (65 FE) MG PO TABS
325.0000 mg | ORAL_TABLET | Freq: Every day | ORAL | 1 refills | Status: AC
Start: 2023-07-30 — End: ?

## 2023-07-30 NOTE — Telephone Encounter (Signed)
Attempt call regarding referral; Left HIPPA-compliant message to call back Mikle Sternberg from [facility] at  [phone] (Ashelyn Mccravy's office).    

## 2023-07-30 NOTE — Progress Notes (Signed)
Post Partum Visit Note  Candice White is a 32 y.o. [phone] female who presents for a postpartum visit. She is 4 weeks postpartum following a normal spontaneous vaginal delivery.  I have fully reviewed the prenatal and intrapartum course. The delivery was at 39 gestational weeks.  Anesthesia: local. Postpartum course has been good. Baby is doing well. Baby is feeding by breast. Bleeding staining only. Bowel function is normal. Bladder function is normal. Patient is not sexually active. Contraception method is Nexplanon. Postpartum depression screening: positive.   The pregnancy intention screening data noted above was reviewed. Potential methods of contraception were discussed. The patient elected to proceed with No data recorded.   Edinburgh Postnatal Depression Scale - 07/30/23 1420       Edinburgh Postnatal Depression Scale:  In the Past 7 Days   I have been able to laugh and see the funny side of things. 1    I have looked forward with enjoyment to things. 1    I have blamed myself unnecessarily when things went wrong. 2    I have been anxious or worried for no good reason. 2    I have felt scared or panicky for no good reason. 0    Things have been getting on top of me. 2    I have been so unhappy that I have had difficulty sleeping. 3    I have felt sad or miserable. 3    I have been so unhappy that I have been crying. 3    The thought of harming myself has occurred to me. 0    Edinburgh Postnatal Depression Scale Total 17             Health Maintenance Due  Topic Date Due   INFLUENZA VACCINE  Never done   COVID-19 Vaccine (1 - 2023-24 season) Never done   Cervical Cancer Screening (HPV/Pap Cotest)  08/31/2023    The following portions of the patient's history were reviewed and updated as appropriate: allergies, current medications, past family history, past medical history, past social history, past surgical history, and problem list.  Review of Systems Pertinent  items are noted in HPI.  Objective:  BP (!) 109/59   Pulse 74   Ht 5\' 3"  (1.6 m)   Wt 113 lb (51.3 kg)   LMP 08/14/2022   Breastfeeding Yes   BMI 20.02 kg/m    General:  alert and cooperative   Breasts:  not indicated  Lungs: Normal effort  Heart:  Normal rate  Abdomen: soft    Wound N/a  GU exam:  not indicated       Assessment:   1. Postpartum exam Discussed methods of contraception, leaning towards nexplanon, would like to come back and get it. Will schedule w/ pap in a few weeks. Discussed abstinence or come on cycle.   2. Anemia affecting pregnancy, antepartum Hgb at d/c 7.3, start oral iron, rx sent. Checking cbc today  3. Postpartum depression Scheduled appointment to meet with Asher Muir, also has another person she has been meeting. Would like to try this first and will reach out if would like to discuss medication, emergency information provided. Denies SI/HI.   Plan:   Essential components of care per ACOG recommendations:  1.  Mood and well being: Patient with positive depression screening today. Reviewed local resources for support.  - Patient tobacco use? No.   - hx of drug use? No.    2. Infant care and feeding:  -  Patient currently breastmilk feeding? Yes. Discussed returning to work and pumping.  -Social determinants of health (SDOH) reviewed in EPIC. No concerns 3. Sexuality, contraception and birth spacing - Patient does not want a pregnancy in the next year.  Desired family size is 4 children.  - Reviewed reproductive life planning. Reviewed contraceptive methods based on pt preferences and effectiveness.  Patient desired Hormonal Implant today.   - Discussed birth spacing of 18 months  4. Sleep and fatigue -Encouraged family/partner/community support of 4 hrs of uninterrupted sleep to help with mood and fatigue  5. Physical Recovery  - Discussed patients delivery and complications. She describes her labor as good. - Patient had a Vaginal, no  problems at delivery. Patient had a  none  laceration. Perineal healing reviewed. Patient expressed understanding - Patient has urinary incontinence? No. - Patient is safe to resume physical and sexual activity  6.  Health Maintenance - HM due items addressed Yes - Last pap smear  Diagnosis  Date Value Ref Range Status  08/30/2020   Final   - Negative for intraepithelial lesion or malignancy (NILM)   Pap smear not done at today's visit.  -Breast Cancer screening indicated? No.   7. Chronic Disease/Pregnancy Condition follow up: None  - PCP follow up  Return in 2-3 weeks for pap and nexplanon insertion Future Appointments  Date Time Provider Department Center  08/19/2023 10:35 AM Reva Bores, MD [facility] None  09/01/2023 10:15 AM [facility]      Albertine Grates, FNP  Center for [employer], [facility]

## 2023-07-30 NOTE — Patient Instructions (Signed)
[email or web address]

## 2023-07-31 LAB — CBC
Hematocrit: 31.6 % — ABNORMAL LOW (ref 34.0–46.6)
Hemoglobin: 9 g/dL — ABNORMAL LOW (ref 11.1–15.9)
MCH: 23 pg — ABNORMAL LOW (ref 26.6–33.0)
MCHC: 28.5 g/dL — ABNORMAL LOW (ref 31.5–35.7)
MCV: 81 fL (ref 79–97)
Platelets: 416 10*3/uL (ref 150–450)
RBC: 3.91 x10E6/uL (ref 3.77–5.28)
RDW: 16.8 % — ABNORMAL HIGH (ref 11.7–15.4)
WBC: 6.3 10*3/uL (ref 3.4–10.8)

## 2023-08-01 ENCOUNTER — Encounter: Payer: Self-pay | Admitting: Obstetrics and Gynecology

## 2023-08-01 ENCOUNTER — Telehealth: Payer: Self-pay

## 2023-08-01 NOTE — Telephone Encounter (Signed)
[place], patient will be scheduled as soon as possible.  Auth Submission: NO AUTH NEEDED Site of care: Site of care: [facility] Payer: [employer] Medicaid Medication & CPT/J Code(s) submitted: Venofer (Iron Sucrose) J1756 Route of submission (phone, fax, portal):  Phone # Fax # Auth type: Buy/Bill PB Units/visits requested: 200mg  x 1 dose Reference number:  Approval from: 08/01/23 to 09/30/23

## 2023-08-01 NOTE — Addendum Note (Signed)
Addended by: Ihor Austin D on: 08/01/2023 08:58 AM   Modules accepted: Orders

## 2023-08-01 NOTE — Addendum Note (Signed)
Addended by: Sue Lush on: 08/01/2023 08:19 AM   Modules accepted: Orders

## 2023-08-18 NOTE — BH Specialist Note (Signed)
Integrated Behavioral Health via Telemedicine Visit  09/01/2023 Candice White [id number]  Number of Integrated Behavioral Health Clinician visits: 1- Initial Visit  Session Start time: 1017   Session End time: 1028  Total time in minutes: 11   Referring Provider: Scheryl Darter, MD Patient/Family location: Home [facility] Provider location: [facility]  All persons participating in visit: Patient Candice White and [facility] [name]   Types of Service: Individual psychotherapy and Video visit  I connected with Candice White and/or Candice White's  children  via  Telephone or Video Enabled Telemedicine Application  (Video is Caregility application) and verified that I am speaking with the correct person using two identifiers. Discussed confidentiality: Yes   I discussed the limitations of telemedicine and the availability of in person appointments.  Discussed there is a possibility of technology failure and discussed alternative modes of communication if that failure occurs.  I discussed that engaging in this telemedicine visit, they consent to the provision of behavioral healthcare and the services will be billed under their insurance.  Patient and/or legal guardian expressed understanding and consented to Telemedicine visit: Yes   Presenting Concerns: Patient and/or family reports the following symptoms/concerns: Concern regarding missed postpartum medical appointment and continuous discharge since birth; good appetite and good support at home; sleep and mood improving over time. Pt has no other concern at this time.  Duration of problem: Postpartum; Severity of problem: mild  Patient and/or Family's Strengths/Protective Factors: Social connections, Concrete supports in place (healthy food, safe environments, etc.), Sense of purpose, and Physical Health (exercise, healthy diet, medication compliance, etc.)  Goals Addressed: Patient  will:  Maintain reduced symptoms of: anxiety and depression   Increase knowledge and/or ability of: healthy habits   Demonstrate ability to: Increase healthy adjustment to current life circumstances  Progress towards Goals: Ongoing  Interventions: Interventions utilized:  Psychoeducation and/or Health Education and Link to [employer] Standardized Assessments completed: GAD-7 and PHQ 9  Patient and/or Family Response: Patient agrees with treatment plan.   Assessment: Patient currently experiencing Adjustment disorder, unspecified  Patient may benefit from psychoeducation and brief therapeutic interventions regarding maintaining reduced depression and anxiety .  Plan: Follow up with behavioral health clinician on : Call [name] at [phone], as needed. Behavioral recommendations:  -Continue prioritizing healthy self-care (regular meals, adequate rest; allowing practical help from supportive friends and family) until at least postpartum medical appointment -Consider new mom support group as needed at either www.postpartum.net or [email or web address]   Referral(s): Integrated Art gallery manager (In Clinic) and [employer] Resources:  Childcare; New mom support  I discussed the assessment and treatment plan with the patient and/or parent/guardian. They were provided an opportunity to ask questions and all were answered. They agreed with the plan and demonstrated an understanding of the instructions.   They were advised to call back or seek an in-person evaluation if the symptoms worsen or if the condition fails to improve as anticipated.  [name], LCSW    09/01/2023   10:26 AM 07/01/2023    3:19 PM 03/21/2023    9:28 AM 11/05/2021    1:47 PM 09/11/2021   11:08 AM  Depression screen PHQ 2/9  Decreased Interest 1 2 3  0 2  Down, Depressed, Hopeless 0 1 0 0 2  PHQ - 2 Score 1 3 3  0 4  Altered sleeping 1 3 0 3 2  Tired, decreased energy 1 3 2 3 1   Change  in  appetite 0 0 2 0 1  Feeling bad or failure about yourself  1 0 0 0 0  Trouble concentrating 1 0 1 0 1  Moving slowly or fidgety/restless 0 0 0 0 0  Suicidal thoughts 0 0 0 0 0  PHQ-9 Score 5 9 8 6 9       09/01/2023   10:28 AM 07/01/2023    3:20 PM 11/05/2021    1:48 PM 09/11/2021   11:09 AM  GAD 7 : Generalized Anxiety Score  Nervous, Anxious, on Edge 1 1 0 2  Control/stop worrying 1 1 1 2   Worry too much - different things 1 1 1 2   Trouble relaxing 1 1 1 2   Restless 1 1 1 2   Easily annoyed or irritable 0 1 1 2   Afraid - awful might happen 0 1 1 1   Total GAD 7 Score 5 7 6  13

## 2023-08-19 ENCOUNTER — Ambulatory Visit: Payer: Medicaid Other | Admitting: Family Medicine

## 2023-08-26 ENCOUNTER — Encounter: Payer: Self-pay | Admitting: Obstetrics and Gynecology

## 2023-09-01 ENCOUNTER — Ambulatory Visit: Payer: Medicaid Other | Admitting: Clinical

## 2023-09-01 DIAGNOSIS — F4320 Adjustment disorder, unspecified: Secondary | ICD-10-CM

## 2023-09-01 NOTE — Patient Instructions (Signed)
[facility] [address] [place], [place] [address] [phone] (main office) [phone] (Rodney Yera's office)  New Parent Support Groups www.[email or web address].net www.conehealthybaby.com  [facility]  (Childcare options, Early childcare development, etc.) www.[facility].org  [facility] Search Engine  https://ncchildcare.ncdhhs.gov/childcaresearch  

## 2023-09-04 ENCOUNTER — Other Ambulatory Visit (HOSPITAL_COMMUNITY)
Admission: RE | Admit: 2023-09-04 | Discharge: 2023-09-04 | Disposition: A | Discharge status: Home / Self Care | Payer: Medicaid Other | Source: Ambulatory Visit | Attending: Obstetrics & Gynecology | Admitting: Obstetrics & Gynecology

## 2023-09-04 ENCOUNTER — Ambulatory Visit (INDEPENDENT_AMBULATORY_CARE_PROVIDER_SITE_OTHER): Payer: Medicaid Other | Admitting: Obstetrics & Gynecology

## 2023-09-04 VITALS — BP 129/55 | HR 87 | Wt 113.4 lb

## 2023-09-04 DIAGNOSIS — Z01419 Encounter for gynecological examination (general) (routine) without abnormal findings: Secondary | ICD-10-CM | POA: Diagnosis present

## 2023-09-04 DIAGNOSIS — Z30013 Encounter for initial prescription of injectable contraceptive: Secondary | ICD-10-CM | POA: Diagnosis not present

## 2023-09-04 MED ORDER — MEDROXYPROGESTERONE ACETATE 150 MG/ML IM SUSP
150.0000 mg | Freq: Once | INTRAMUSCULAR | Status: AC
  Administered 2023-09-04: 150 mg via INTRAMUSCULAR

## 2023-09-04 MED ORDER — MEDROXYPROGESTERONE ACETATE 150 MG/ML IM SUSP: 150.0000 mg | INTRAMUSCULAR | 4 refills | Status: AC

## 2023-09-04 NOTE — Progress Notes (Signed)
CC: Pap and wanting depo instead of nexplanon    Pt denies having period since baby, breastfeeding

## 2023-09-04 NOTE — Progress Notes (Signed)
GYNECOLOGY CLINIC ANNUAL PREVENTATIVE CARE ENCOUNTER NOTE  Subjective:   Candice White is a 32 y.o. [phone] female here for a routine annual gynecologic exam.  Current complaints: needs pap test and has decided to start depo provera.   Denies abnormal vaginal bleeding, discharge, pelvic pain, problems with intercourse or other gynecologic concerns.    Gynecologic History No LMP recorded. Contraception: abstinence Last Pap: 2021. Results were: normal   Obstetric History OB History  Gravida Para Term Preterm AB Living  4 4 4     4   SAB IAB Ectopic Multiple Live Births        0 4    # Outcome Date GA Lbr Len/2nd Weight Sex Type Anes PTL Lv  4 Term 07/02/23 [redacted]w[redacted]d 13:35 / 00:08 7 lb 10.1 oz (3.46 kg) F Vag-Spont Local  LIV  3 Term 12/08/21 [redacted]w[redacted]d 08:37 / 00:04 7 lb 12 oz (3.515 kg) F Vag-Spont None  LIV  2 Term 08/08/18 [redacted]w[redacted]d  8 lb (3.629 kg) F Vag-Spont   LIV  1 Term 05/25/15 [redacted]w[redacted]d 11:25 / 01:14 7 lb 3.7 oz (3.28 kg) M Vag-Spont EPI  LIV    Past Medical History:  Diagnosis Date   BV (bacterial vaginosis)    Migraine    chronic ha    PID (acute pelvic inflammatory disease)    PROM (premature rupture of membranes) 07/01/2023    Past Surgical History:  Procedure Laterality Date   NO PAST SURGERIES      Current Outpatient Medications on File Prior to Visit  Medication Sig Dispense Refill   acetaminophen (TYLENOL) 325 MG tablet Take 2 tablets (650 mg total) by mouth every 4 (four) hours as needed (for pain scale < 4). 60 tablet 0   ferrous sulfate 325 (65 FE) MG tablet Take 1 tablet (325 mg total) by mouth daily with breakfast. 90 tablet 1   ibuprofen (ADVIL) 600 MG tablet Take 1 tablet (600 mg total) by mouth every 6 (six) hours. 30 tablet 0   ferrous sulfate 325 (65 FE) MG tablet Take 1 tablet (325 mg total) by mouth every other day. (Patient not taking: Reported on 07/30/2023) 90 tablet 1   Prenatal Vit-Fe Phos-FA-Omega (VITAFOL GUMMIES) 3.33-0.333-34.8 MG CHEW Chew 3 tablets by  mouth daily. (Patient not taking: Reported on 05/15/2023) 90 tablet 11   No current facility-administered medications on file prior to visit.    No Known Allergies  Social History   Socioeconomic History   Marital status: Single    Spouse name: Not on file   Number of children: Not on file   Years of education: Not on file   Highest education level: Not on file  Occupational History   Not on file  Tobacco Use   Smoking status: Former    Current packs/day: 0.00    Types: Cigarettes    Start date: 12/08/2009    Quit date: 12/08/2012    Years since quitting: 10.7   Smokeless tobacco: Former    Quit date: 10/02/2014  Vaping Use   Vaping status: Never Used  Substance and Sexual Activity   Alcohol use: Not Currently    Comment: had some wine in the last week   Drug use: Not Currently    Types: Marijuana    Comment: last smoked was when found out was pregnant with current pregnancy    Sexual activity: Yes    Partners: Male    Birth control/protection: None  Other Topics Concern   Not on file  Social History Narrative   Not on file   Social Determinants of Health   Financial Resource Strain: Not on file  Food Insecurity: No Food Insecurity (07/01/2023)   Hunger Vital Sign    Worried About Running Out of Food in the Last Year: Never true    Ran Out of Food in the Last Year: Never true  Transportation Needs: Unmet Transportation Needs (07/01/2023)   PRAPARE - Administrator, Civil Service (Medical): Yes    Lack of Transportation (Non-Medical): Yes  Physical Activity: Not on file  Stress: Not on file  Social Connections: Not on file  Intimate Partner Violence: Not At Risk (07/01/2023)   Humiliation, Afraid, Rape, and Kick questionnaire    Fear of Current or Ex-Partner: No    Emotionally Abused: No    Physically Abused: No    Sexually Abused: No    Family History  Problem Relation Age of Onset   Hypertension Mother    Vision loss Father    Cancer Maternal  Aunt        lung cancer   Cancer Cousin        lung    The following portions of the patient's history were reviewed and updated as appropriate: allergies, current medications, past family history, past medical history, past social history, past surgical history and problem list.  Review of Systems Pertinent items are noted in HPI.   Objective:  BP (!) 129/55   Pulse 87   Wt 113 lb 6.4 oz (51.4 kg)   BMI 20.09 kg/m  CONSTITUTIONAL: Well-developed, well-nourished female in no acute distress.  HENT:  Normocephalic, atraumatic, External right and left ear normal. Oropharynx is clear and moist EYES: Conjunctivae and EOM are normal. Pupils are equal, round, and reactive to light. No scleral icterus.  NECK: Normal range of motion, supple, no masses.  Normal thyroid.  SKIN: Skin is warm and dry. No rash noted. Not diaphoretic. No erythema. No pallor. NEUROLGIC: Alert and oriented to person, place, and time. Normal reflexes, muscle tone coordination. No cranial nerve deficit noted. PSYCHIATRIC: Normal mood and affect. Normal behavior. Normal judgment and thought content. CARDIOVASCULAR: Normal heart rate noted, regular rhythm RESPIRATORY: Clear to auscultation bilaterally. Effort and breath sounds normal, no problems with respiration noted. ABDOMEN: Soft, normal bowel sounds, no distention noted.  No tenderness, rebound or guarding.  PELVIC: Normal appearing external genitalia; normal appearing vaginal mucosa and cervix.  No abnormal discharge noted.  Pap smear obtained.  Normal uterine size, no other palpable masses, no uterine or adnexal tenderness. MUSCULOSKELETAL: Normal range of motion. No tenderness.  No cyanosis, clubbing, or edema.  Assessment:  Annual gynecologic examination with pap smear   Plan:  Will follow up results of pap smear and manage accordingly.  Routine preventative health maintenance measures emphasized. Please refer to After Visit Summary for other counseling  recommendations.  DMPa Q 3 MO  Scheryl Darter, MD Attending Obstetrician & Gynecologist Center for [employer], [facility]

## 2023-09-05 LAB — CERVICOVAGINAL ANCILLARY ONLY
Bacterial Vaginitis (gardnerella): NEGATIVE
Candida Glabrata: NEGATIVE
Candida Vaginitis: NEGATIVE
Comment: NEGATIVE
Comment: NEGATIVE
Comment: NEGATIVE

## 2023-09-08 LAB — CYTOLOGY - PAP
Chlamydia: NEGATIVE
Comment: NEGATIVE
Comment: NEGATIVE
Comment: NORMAL
Diagnosis: NEGATIVE
High risk HPV: NEGATIVE
Neisseria Gonorrhea: NEGATIVE

## 2023-09-10 ENCOUNTER — Telehealth: Payer: Self-pay

## 2023-09-10 NOTE — Telephone Encounter (Signed)
Returned call and advised of results 

## 2023-09-30 ENCOUNTER — Ambulatory Visit: Payer: Medicaid Other | Admitting: Obstetrics & Gynecology

## 2023-11-25 ENCOUNTER — Ambulatory Visit: Payer: Medicaid Other | Admitting: General Practice

## 2023-11-25 VITALS — BP 111/66 | HR 77 | Ht 64.0 in | Wt 118.0 lb

## 2023-11-25 DIAGNOSIS — Z3042 Encounter for surveillance of injectable contraceptive: Secondary | ICD-10-CM

## 2023-11-25 MED ORDER — MEDROXYPROGESTERONE ACETATE 150 MG/ML IM SUSP
150.0000 mg | Freq: Once | INTRAMUSCULAR | Status: AC
  Administered 2023-11-25: 150 mg via INTRAMUSCULAR

## 2023-11-25 NOTE — Progress Notes (Signed)
 Date last pap: 09-04-23. Last Depo-Provera: 09-04-23. Side Effects if any: N/A. Serum HCG indicated? N/A. Depo-Provera 150 mg IM given by: Hope Pigeon, CMA in the RD. Next appointment due 5/13-5/27.

## 2024-02-18 ENCOUNTER — Ambulatory Visit: Payer: Medicaid Other

## 2024-02-19 ENCOUNTER — Ambulatory Visit

## 2024-02-19 VITALS — BP 105/68 | HR 75 | Wt 130.2 lb

## 2024-02-19 DIAGNOSIS — Z3042 Encounter for surveillance of injectable contraceptive: Secondary | ICD-10-CM | POA: Diagnosis not present

## 2024-02-19 MED ORDER — MEDROXYPROGESTERONE ACETATE 150 MG/ML IM SUSP
150.0000 mg | INTRAMUSCULAR | Status: AC
  Administered 2024-02-19 – 2024-08-12 (×2): 150 mg via INTRAMUSCULAR

## 2024-02-19 NOTE — Progress Notes (Signed)
 Pt is in the office for depo injection. Administered in and pt tolerated well. Next due Aug 7-21. .. Administrations This Visit     medroxyPROGESTERone  (DEPO-PROVERA ) injection 150 mg     Admin Date 02/19/2024 Action Given Dose 150 mg Route Intramuscular Documented By Sarrah Cure, RN

## 2024-05-03 ENCOUNTER — Ambulatory Visit

## 2024-05-03 DIAGNOSIS — Z3042 Encounter for surveillance of injectable contraceptive: Secondary | ICD-10-CM

## 2024-05-10 ENCOUNTER — Ambulatory Visit

## 2024-05-17 ENCOUNTER — Ambulatory Visit

## 2024-05-19 ENCOUNTER — Ambulatory Visit

## 2024-05-19 DIAGNOSIS — Z3042 Encounter for surveillance of injectable contraceptive: Secondary | ICD-10-CM

## 2024-05-19 MED ORDER — MEDROXYPROGESTERONE ACETATE 150 MG/ML IM SUSP
150.0000 mg | Freq: Once | INTRAMUSCULAR | Status: AC
  Administered 2024-05-19: 150 mg via INTRAMUSCULAR

## 2024-05-19 NOTE — Progress Notes (Signed)
 Date last pap: 09/04/23. Last Depo-Provera : 02/19/24. Side Effects if any: NA. Serum HCG indicated? NA. Depo-Provera  150 mg IM given by: Duwaine Galla, RN. Next appointment due Nov 5-19 2025.

## 2024-05-25 ENCOUNTER — Ambulatory Visit

## 2024-05-26 ENCOUNTER — Ambulatory Visit

## 2024-06-02 ENCOUNTER — Ambulatory Visit

## 2024-07-23 ENCOUNTER — Ambulatory Visit

## 2024-08-04 ENCOUNTER — Ambulatory Visit

## 2024-08-12 ENCOUNTER — Ambulatory Visit (INDEPENDENT_AMBULATORY_CARE_PROVIDER_SITE_OTHER)

## 2024-08-12 VITALS — BP 109/69 | HR 80 | Wt 136.1 lb

## 2024-08-12 DIAGNOSIS — Z3042 Encounter for surveillance of injectable contraceptive: Secondary | ICD-10-CM | POA: Diagnosis not present

## 2024-08-12 NOTE — Progress Notes (Signed)
 Pt is in the office for depo injection. Administered in R [place] and pt tolerated well Pt states that she is still having irregular bleeding on depo and may want to change if it does not stop after this injection. Advised to make appt with provider if no changes in bleeding. Next due Jan 29- Feb 12 .SABRA Administrations This Visit     medroxyPROGESTERone  (DEPO-PROVERA ) injection 150 mg     Admin Date 08/12/2024 Action Given Dose 150 mg Route Intramuscular Documented By Doneta Laymon BIRCH, RN

## 2024-10-01 ENCOUNTER — Encounter (HOSPITAL_BASED_OUTPATIENT_CLINIC_OR_DEPARTMENT_OTHER): Payer: Self-pay

## 2024-10-12 ENCOUNTER — Ambulatory Visit (HOSPITAL_BASED_OUTPATIENT_CLINIC_OR_DEPARTMENT_OTHER)

## 2024-10-18 ENCOUNTER — Ambulatory Visit (HOSPITAL_BASED_OUTPATIENT_CLINIC_OR_DEPARTMENT_OTHER)

## 2024-10-25 ENCOUNTER — Ambulatory Visit: Payer: Self-pay | Admitting: Obstetrics and Gynecology

## 2024-10-28 ENCOUNTER — Ambulatory Visit

## 2024-11-09 ENCOUNTER — Ambulatory Visit
